# Patient Record
Sex: Female | Born: 1955 | Race: White | Hispanic: No | Smoking: Current every day smoker
Health system: Southern US, Community
[De-identification: ages and names within clinical notes are randomized; demographics above are authoritative.]

## PROBLEM LIST (undated history)

## (undated) DIAGNOSIS — D0510 Intraductal carcinoma in situ of unspecified breast: Secondary | ICD-10-CM

## (undated) DIAGNOSIS — I2510 Atherosclerotic heart disease of native coronary artery without angina pectoris: Secondary | ICD-10-CM

## (undated) DIAGNOSIS — K648 Other hemorrhoids: Secondary | ICD-10-CM

## (undated) DIAGNOSIS — F909 Attention-deficit hyperactivity disorder, unspecified type: Secondary | ICD-10-CM

## (undated) DIAGNOSIS — J189 Pneumonia, unspecified organism: Secondary | ICD-10-CM

## (undated) DIAGNOSIS — I6529 Occlusion and stenosis of unspecified carotid artery: Secondary | ICD-10-CM

## (undated) DIAGNOSIS — F17200 Nicotine dependence, unspecified, uncomplicated: Principal | ICD-10-CM

## (undated) DIAGNOSIS — D126 Benign neoplasm of colon, unspecified: Secondary | ICD-10-CM

## (undated) DIAGNOSIS — I82409 Acute embolism and thrombosis of unspecified deep veins of unspecified lower extremity: Secondary | ICD-10-CM

## (undated) DIAGNOSIS — E039 Hypothyroidism, unspecified: Secondary | ICD-10-CM

## (undated) DIAGNOSIS — K5790 Diverticulosis of intestine, part unspecified, without perforation or abscess without bleeding: Secondary | ICD-10-CM

## (undated) DIAGNOSIS — G5600 Carpal tunnel syndrome, unspecified upper limb: Secondary | ICD-10-CM

## (undated) DIAGNOSIS — I10 Essential (primary) hypertension: Secondary | ICD-10-CM

## (undated) DIAGNOSIS — M1990 Unspecified osteoarthritis, unspecified site: Secondary | ICD-10-CM

## (undated) DIAGNOSIS — D509 Iron deficiency anemia, unspecified: Secondary | ICD-10-CM

## (undated) HISTORY — DX: Hypothyroidism, unspecified: E03.9

## (undated) HISTORY — DX: Attention-deficit hyperactivity disorder, unspecified type: F90.9

## (undated) HISTORY — DX: Nicotine dependence, unspecified, uncomplicated: F17.200

## (undated) HISTORY — PX: ABDOMINAL HYSTERECTOMY: SHX81

## (undated) HISTORY — DX: Diverticulosis of intestine, part unspecified, without perforation or abscess without bleeding: K57.90

## (undated) HISTORY — DX: Iron deficiency anemia, unspecified: D50.9

## (undated) HISTORY — PX: UPPER GASTROINTESTINAL ENDOSCOPY: SHX188

## (undated) HISTORY — DX: Atherosclerotic heart disease of native coronary artery without angina pectoris: I25.10

## (undated) HISTORY — DX: Other hemorrhoids: K64.8

## (undated) HISTORY — PX: COLONOSCOPY: SHX174

## (undated) HISTORY — DX: Acute embolism and thrombosis of unspecified deep veins of unspecified lower extremity: I82.409

## (undated) HISTORY — DX: Carpal tunnel syndrome, unspecified upper limb: G56.00

## (undated) HISTORY — DX: Benign neoplasm of colon, unspecified: D12.6

## (undated) HISTORY — DX: Intraductal carcinoma in situ of unspecified breast: D05.10

## (undated) HISTORY — DX: Occlusion and stenosis of unspecified carotid artery: I65.29

---

## 2003-01-05 ENCOUNTER — Encounter: Admission: RE | Admit: 2003-01-05 | Discharge: 2003-01-05 | Payer: Self-pay | Admitting: Family Medicine

## 2003-01-05 ENCOUNTER — Encounter: Payer: Self-pay | Admitting: Family Medicine

## 2003-10-01 ENCOUNTER — Encounter: Admission: RE | Admit: 2003-10-01 | Discharge: 2003-10-01 | Payer: Self-pay | Admitting: Family Medicine

## 2004-11-10 ENCOUNTER — Ambulatory Visit: Payer: Self-pay | Admitting: Family Medicine

## 2006-09-18 ENCOUNTER — Emergency Department (HOSPITAL_COMMUNITY): Admission: EM | Admit: 2006-09-18 | Discharge: 2006-09-18 | Payer: Self-pay | Admitting: Family Medicine

## 2006-10-15 ENCOUNTER — Emergency Department (HOSPITAL_COMMUNITY): Admission: EM | Admit: 2006-10-15 | Discharge: 2006-10-15 | Payer: Self-pay | Admitting: Family Medicine

## 2007-01-03 ENCOUNTER — Encounter: Admission: RE | Admit: 2007-01-03 | Discharge: 2007-01-03 | Payer: Self-pay | Admitting: Family Medicine

## 2007-01-08 ENCOUNTER — Encounter: Admission: RE | Admit: 2007-01-08 | Discharge: 2007-01-08 | Payer: Self-pay | Admitting: Family Medicine

## 2007-01-08 ENCOUNTER — Encounter (INDEPENDENT_AMBULATORY_CARE_PROVIDER_SITE_OTHER): Payer: Self-pay | Admitting: Specialist

## 2007-01-30 ENCOUNTER — Encounter: Admission: RE | Admit: 2007-01-30 | Discharge: 2007-01-30 | Payer: Self-pay | Admitting: Surgery

## 2007-01-30 ENCOUNTER — Encounter (INDEPENDENT_AMBULATORY_CARE_PROVIDER_SITE_OTHER): Payer: Self-pay | Admitting: Specialist

## 2007-01-30 ENCOUNTER — Ambulatory Visit (HOSPITAL_BASED_OUTPATIENT_CLINIC_OR_DEPARTMENT_OTHER): Admission: RE | Admit: 2007-01-30 | Discharge: 2007-01-30 | Payer: Self-pay | Admitting: Surgery

## 2007-02-04 ENCOUNTER — Ambulatory Visit: Payer: Self-pay | Admitting: Oncology

## 2007-02-07 ENCOUNTER — Ambulatory Visit: Admission: RE | Admit: 2007-02-07 | Discharge: 2007-04-19 | Payer: Self-pay | Admitting: *Deleted

## 2007-02-13 ENCOUNTER — Encounter: Admission: RE | Admit: 2007-02-13 | Discharge: 2007-02-13 | Payer: Self-pay | Admitting: Surgery

## 2007-02-21 ENCOUNTER — Encounter (INDEPENDENT_AMBULATORY_CARE_PROVIDER_SITE_OTHER): Payer: Self-pay | Admitting: Surgery

## 2007-02-21 ENCOUNTER — Ambulatory Visit (HOSPITAL_BASED_OUTPATIENT_CLINIC_OR_DEPARTMENT_OTHER): Admission: RE | Admit: 2007-02-21 | Discharge: 2007-02-21 | Payer: Self-pay | Admitting: Surgery

## 2007-03-27 ENCOUNTER — Ambulatory Visit: Payer: Self-pay | Admitting: Oncology

## 2007-03-29 LAB — COMPREHENSIVE METABOLIC PANEL
ALT: 16 U/L (ref 0–35)
AST: 19 U/L (ref 0–37)
Albumin: 4.1 g/dL (ref 3.5–5.2)
Alkaline Phosphatase: 92 U/L (ref 39–117)
BUN: 11 mg/dL (ref 6–23)
CO2: 25 mEq/L (ref 19–32)
Calcium: 9.6 mg/dL (ref 8.4–10.5)
Chloride: 105 mEq/L (ref 96–112)
Creatinine, Ser: 0.84 mg/dL (ref 0.40–1.20)
Glucose, Bld: 98 mg/dL (ref 70–99)
Potassium: 4 mEq/L (ref 3.5–5.3)
Sodium: 140 mEq/L (ref 135–145)
Total Bilirubin: 0.5 mg/dL (ref 0.3–1.2)
Total Protein: 6.4 g/dL (ref 6.0–8.3)

## 2007-03-29 LAB — CBC WITH DIFFERENTIAL/PLATELET
BASO%: 0.5 % (ref 0.0–2.0)
Basophils Absolute: 0 10*3/uL (ref 0.0–0.1)
EOS%: 2.4 % (ref 0.0–7.0)
Eosinophils Absolute: 0.2 10*3/uL (ref 0.0–0.5)
HCT: 39.2 % (ref 34.8–46.6)
HGB: 13.9 g/dL (ref 11.6–15.9)
LYMPH%: 35.5 % (ref 14.0–48.0)
MCH: 34.3 pg — ABNORMAL HIGH (ref 26.0–34.0)
MCHC: 35.4 g/dL (ref 32.0–36.0)
MCV: 96.9 fL (ref 81.0–101.0)
MONO#: 0.5 10*3/uL (ref 0.1–0.9)
MONO%: 5.3 % (ref 0.0–13.0)
NEUT#: 5.7 10*3/uL (ref 1.5–6.5)
NEUT%: 56.3 % (ref 39.6–76.8)
Platelets: 423 10*3/uL — ABNORMAL HIGH (ref 145–400)
RBC: 4.05 10*6/uL (ref 3.70–5.32)
RDW: 13.6 % (ref 11.3–14.5)
WBC: 10.1 10*3/uL — ABNORMAL HIGH (ref 3.9–10.0)
lymph#: 3.6 10*3/uL — ABNORMAL HIGH (ref 0.9–3.3)

## 2007-04-19 ENCOUNTER — Ambulatory Visit: Admission: RE | Admit: 2007-04-19 | Discharge: 2007-05-23 | Payer: Self-pay | Admitting: *Deleted

## 2007-06-13 ENCOUNTER — Ambulatory Visit: Payer: Self-pay | Admitting: Oncology

## 2007-06-13 LAB — FOLLICLE STIMULATING HORMONE: FSH: 79.9 m[IU]/mL

## 2007-06-13 LAB — LUTEINIZING HORMONE: LH: 47.8 m[IU]/mL

## 2007-06-20 LAB — ESTRADIOL, ULTRA SENS: Estradiol, Ultra Sensitive: <2 pg/mL

## 2007-06-28 ENCOUNTER — Encounter: Admission: RE | Admit: 2007-06-28 | Discharge: 2007-06-28 | Payer: Self-pay | Admitting: Oncology

## 2007-09-05 ENCOUNTER — Ambulatory Visit: Payer: Self-pay | Admitting: Oncology

## 2007-10-14 ENCOUNTER — Inpatient Hospital Stay (HOSPITAL_COMMUNITY): Admission: EM | Admit: 2007-10-14 | Discharge: 2007-10-16 | Payer: Self-pay | Admitting: Emergency Medicine

## 2007-10-14 ENCOUNTER — Encounter (INDEPENDENT_AMBULATORY_CARE_PROVIDER_SITE_OTHER): Payer: Self-pay | Admitting: Internal Medicine

## 2007-10-16 ENCOUNTER — Encounter (INDEPENDENT_AMBULATORY_CARE_PROVIDER_SITE_OTHER): Payer: Self-pay | Admitting: Internal Medicine

## 2007-10-18 ENCOUNTER — Ambulatory Visit: Payer: Self-pay | Admitting: Surgery

## 2008-01-06 ENCOUNTER — Encounter: Admission: RE | Admit: 2008-01-06 | Discharge: 2008-01-06 | Payer: Self-pay | Admitting: Oncology

## 2008-02-27 ENCOUNTER — Ambulatory Visit: Payer: Self-pay | Admitting: Oncology

## 2008-03-02 LAB — CBC WITH DIFFERENTIAL/PLATELET
BASO%: 0.5 % (ref 0.0–2.0)
Basophils Absolute: 0 10*3/uL (ref 0.0–0.1)
EOS%: 1.2 % (ref 0.0–7.0)
Eosinophils Absolute: 0.1 10*3/uL (ref 0.0–0.5)
HCT: 40.2 % (ref 34.8–46.6)
HGB: 14 g/dL (ref 11.6–15.9)
LYMPH%: 23.5 % (ref 14.0–48.0)
MCH: 33.2 pg (ref 26.0–34.0)
MCHC: 34.8 g/dL (ref 32.0–36.0)
MCV: 95.5 fL (ref 81.0–101.0)
MONO#: 0.5 10*3/uL (ref 0.1–0.9)
MONO%: 4.9 % (ref 0.0–13.0)
NEUT#: 6.4 10*3/uL (ref 1.5–6.5)
NEUT%: 69.9 % (ref 39.6–76.8)
Platelets: 417 10*3/uL — ABNORMAL HIGH (ref 145–400)
RBC: 4.21 10*6/uL (ref 3.70–5.32)
RDW: 13.3 % (ref 11.3–14.5)
WBC: 9.2 10*3/uL (ref 3.9–10.0)
lymph#: 2.2 10*3/uL (ref 0.9–3.3)

## 2008-03-03 LAB — COMPREHENSIVE METABOLIC PANEL
ALT: 22 U/L (ref 0–35)
AST: 21 U/L (ref 0–37)
Albumin: 4.3 g/dL (ref 3.5–5.2)
Alkaline Phosphatase: 78 U/L (ref 39–117)
BUN: 13 mg/dL (ref 6–23)
CO2: 23 mEq/L (ref 19–32)
Calcium: 9.6 mg/dL (ref 8.4–10.5)
Chloride: 107 mEq/L (ref 96–112)
Creatinine, Ser: 0.66 mg/dL (ref 0.40–1.20)
Glucose, Bld: 107 mg/dL — ABNORMAL HIGH (ref 70–99)
Potassium: 4.8 mEq/L (ref 3.5–5.3)
Sodium: 139 mEq/L (ref 135–145)
Total Bilirubin: 0.5 mg/dL (ref 0.3–1.2)
Total Protein: 6.9 g/dL (ref 6.0–8.3)

## 2008-03-03 LAB — LACTATE DEHYDROGENASE: LDH: 153 U/L (ref 94–250)

## 2008-03-03 LAB — VITAMIN D 25 HYDROXY (VIT D DEFICIENCY, FRACTURES): Vit D, 25-Hydroxy: 55 ng/mL (ref 30–89)

## 2008-04-20 ENCOUNTER — Ambulatory Visit: Payer: Self-pay | Admitting: Surgery

## 2008-09-07 ENCOUNTER — Ambulatory Visit: Payer: Self-pay | Admitting: Oncology

## 2008-09-09 LAB — CBC WITH DIFFERENTIAL/PLATELET
BASO%: 0.4 % (ref 0.0–2.0)
Basophils Absolute: 0 10*3/uL (ref 0.0–0.1)
EOS%: 2.4 % (ref 0.0–7.0)
Eosinophils Absolute: 0.2 10*3/uL (ref 0.0–0.5)
HCT: 43.3 % (ref 34.8–46.6)
HGB: 14.9 g/dL (ref 11.6–15.9)
LYMPH%: 18.8 % (ref 14.0–48.0)
MCH: 33.7 pg (ref 26.0–34.0)
MCHC: 34.5 g/dL (ref 32.0–36.0)
MCV: 97.7 fL (ref 81.0–101.0)
MONO#: 0.5 10*3/uL (ref 0.1–0.9)
MONO%: 4.9 % (ref 0.0–13.0)
NEUT#: 7.3 10*3/uL — ABNORMAL HIGH (ref 1.5–6.5)
NEUT%: 73.5 % (ref 39.6–76.8)
Platelets: 408 10*3/uL — ABNORMAL HIGH (ref 145–400)
RBC: 4.43 10*6/uL (ref 3.70–5.32)
RDW: 13.4 % (ref 11.3–14.5)
WBC: 9.9 10*3/uL (ref 3.9–10.0)
lymph#: 1.9 10*3/uL (ref 0.9–3.3)

## 2008-09-10 LAB — COMPREHENSIVE METABOLIC PANEL
ALT: 24 U/L (ref 0–35)
AST: 24 U/L (ref 0–37)
Albumin: 4.8 g/dL (ref 3.5–5.2)
Alkaline Phosphatase: 90 U/L (ref 39–117)
BUN: 12 mg/dL (ref 6–23)
CO2: 23 mEq/L (ref 19–32)
Calcium: 9.8 mg/dL (ref 8.4–10.5)
Chloride: 103 mEq/L (ref 96–112)
Creatinine, Ser: 0.68 mg/dL (ref 0.40–1.20)
Glucose, Bld: 105 mg/dL — ABNORMAL HIGH (ref 70–99)
Potassium: 4.3 mEq/L (ref 3.5–5.3)
Sodium: 138 mEq/L (ref 135–145)
Total Bilirubin: 0.5 mg/dL (ref 0.3–1.2)
Total Protein: 7.2 g/dL (ref 6.0–8.3)

## 2008-09-10 LAB — LACTATE DEHYDROGENASE: LDH: 182 U/L (ref 94–250)

## 2008-09-10 LAB — VITAMIN D 25 HYDROXY (VIT D DEFICIENCY, FRACTURES): Vit D, 25-Hydroxy: 54 ng/mL (ref 30–89)

## 2008-09-10 LAB — CANCER ANTIGEN 27.29: CA 27.29: 19 U/mL (ref 0–39)

## 2009-01-06 ENCOUNTER — Encounter: Admission: RE | Admit: 2009-01-06 | Discharge: 2009-01-06 | Payer: Self-pay | Admitting: Oncology

## 2009-03-12 ENCOUNTER — Ambulatory Visit: Payer: Self-pay | Admitting: Oncology

## 2009-03-16 LAB — CBC WITH DIFFERENTIAL/PLATELET
BASO%: 0.3 % (ref 0.0–2.0)
Basophils Absolute: 0 10*3/uL (ref 0.0–0.1)
EOS%: 3.3 % (ref 0.0–7.0)
Eosinophils Absolute: 0.3 10*3/uL (ref 0.0–0.5)
HCT: 40 % (ref 34.8–46.6)
HGB: 14.2 g/dL (ref 11.6–15.9)
LYMPH%: 28.6 % (ref 14.0–49.7)
MCH: 34.5 pg — ABNORMAL HIGH (ref 25.1–34.0)
MCHC: 35.4 g/dL (ref 31.5–36.0)
MCV: 97.3 fL (ref 79.5–101.0)
MONO#: 0.5 10*3/uL (ref 0.1–0.9)
MONO%: 5.7 % (ref 0.0–14.0)
NEUT#: 5.8 10*3/uL (ref 1.5–6.5)
NEUT%: 62.1 % (ref 38.4–76.8)
Platelets: 341 10*3/uL (ref 145–400)
RBC: 4.11 10*6/uL (ref 3.70–5.45)
RDW: 13.8 % (ref 11.2–14.5)
WBC: 9.4 10*3/uL (ref 3.9–10.3)
lymph#: 2.7 10*3/uL (ref 0.9–3.3)

## 2009-03-17 LAB — COMPREHENSIVE METABOLIC PANEL
ALT: 18 U/L (ref 0–35)
AST: 20 U/L (ref 0–37)
Albumin: 3.9 g/dL (ref 3.5–5.2)
Alkaline Phosphatase: 76 U/L (ref 39–117)
BUN: 12 mg/dL (ref 6–23)
CO2: 24 mEq/L (ref 19–32)
Calcium: 9.7 mg/dL (ref 8.4–10.5)
Chloride: 103 mEq/L (ref 96–112)
Creatinine, Ser: 0.75 mg/dL (ref 0.40–1.20)
Glucose, Bld: 111 mg/dL — ABNORMAL HIGH (ref 70–99)
Potassium: 4.2 mEq/L (ref 3.5–5.3)
Sodium: 138 mEq/L (ref 135–145)
Total Bilirubin: 0.3 mg/dL (ref 0.3–1.2)
Total Protein: 6.5 g/dL (ref 6.0–8.3)

## 2009-03-17 LAB — VITAMIN D 25 HYDROXY (VIT D DEFICIENCY, FRACTURES): Vit D, 25-Hydroxy: 24 ng/mL — ABNORMAL LOW (ref 30–89)

## 2009-03-17 LAB — LACTATE DEHYDROGENASE: LDH: 136 U/L (ref 94–250)

## 2009-04-19 ENCOUNTER — Ambulatory Visit: Payer: Self-pay | Admitting: Surgery

## 2009-06-28 ENCOUNTER — Encounter: Admission: RE | Admit: 2009-06-28 | Discharge: 2009-06-28 | Payer: Self-pay | Admitting: Oncology

## 2010-01-12 ENCOUNTER — Encounter: Admission: RE | Admit: 2010-01-12 | Discharge: 2010-01-12 | Payer: Self-pay | Admitting: Oncology

## 2010-03-23 ENCOUNTER — Ambulatory Visit: Payer: Self-pay | Admitting: Oncology

## 2010-03-25 LAB — COMPREHENSIVE METABOLIC PANEL
ALT: 22 U/L (ref 0–35)
AST: 23 U/L (ref 0–37)
Albumin: 3.8 g/dL (ref 3.5–5.2)
Alkaline Phosphatase: 78 U/L (ref 39–117)
BUN: 14 mg/dL (ref 6–23)
CO2: 26 mEq/L (ref 19–32)
Calcium: 8.8 mg/dL (ref 8.4–10.5)
Chloride: 107 mEq/L (ref 96–112)
Creatinine, Ser: 0.72 mg/dL (ref 0.40–1.20)
Glucose, Bld: 110 mg/dL — ABNORMAL HIGH (ref 70–99)
Potassium: 4 mEq/L (ref 3.5–5.3)
Sodium: 137 mEq/L (ref 135–145)
Total Bilirubin: 0.3 mg/dL (ref 0.3–1.2)
Total Protein: 6.4 g/dL (ref 6.0–8.3)

## 2010-03-25 LAB — CBC WITH DIFFERENTIAL/PLATELET
BASO%: 1.8 % (ref 0.0–2.0)
Basophils Absolute: 0.2 10*3/uL — ABNORMAL HIGH (ref 0.0–0.1)
EOS%: 3.9 % (ref 0.0–7.0)
Eosinophils Absolute: 0.3 10*3/uL (ref 0.0–0.5)
HCT: 38.4 % (ref 34.8–46.6)
HGB: 13.1 g/dL (ref 11.6–15.9)
LYMPH%: 33.3 % (ref 14.0–49.7)
MCH: 33.5 pg (ref 25.1–34.0)
MCHC: 34 g/dL (ref 31.5–36.0)
MCV: 98.5 fL (ref 79.5–101.0)
MONO#: 0.5 10*3/uL (ref 0.1–0.9)
MONO%: 5.9 % (ref 0.0–14.0)
NEUT#: 4.7 10*3/uL (ref 1.5–6.5)
NEUT%: 55.1 % (ref 38.4–76.8)
Platelets: 361 10*3/uL (ref 145–400)
RBC: 3.9 10*6/uL (ref 3.70–5.45)
RDW: 14 % (ref 11.2–14.5)
WBC: 8.6 10*3/uL (ref 3.9–10.3)
lymph#: 2.9 10*3/uL (ref 0.9–3.3)

## 2010-03-25 LAB — LACTATE DEHYDROGENASE: LDH: 126 U/L (ref 94–250)

## 2010-03-26 LAB — VITAMIN D 25 HYDROXY (VIT D DEFICIENCY, FRACTURES): Vit D, 25-Hydroxy: 78 ng/mL (ref 30–89)

## 2010-05-03 ENCOUNTER — Ambulatory Visit: Payer: Self-pay | Admitting: Surgery

## 2010-10-02 DIAGNOSIS — Z923 Personal history of irradiation: Secondary | ICD-10-CM

## 2010-10-02 HISTORY — DX: Personal history of irradiation: Z92.3

## 2010-10-22 ENCOUNTER — Other Ambulatory Visit: Payer: Self-pay | Admitting: Oncology

## 2010-10-22 DIAGNOSIS — Z9889 Other specified postprocedural states: Secondary | ICD-10-CM

## 2010-10-22 DIAGNOSIS — Z1231 Encounter for screening mammogram for malignant neoplasm of breast: Secondary | ICD-10-CM

## 2011-02-03 ENCOUNTER — Other Ambulatory Visit: Payer: Self-pay | Admitting: Oncology

## 2011-02-03 DIAGNOSIS — Z9889 Other specified postprocedural states: Secondary | ICD-10-CM

## 2011-02-07 ENCOUNTER — Ambulatory Visit
Admission: RE | Admit: 2011-02-07 | Discharge: 2011-02-07 | Disposition: A | Discharge status: Home / Self Care | Source: Ambulatory Visit | Attending: Oncology | Admitting: Oncology

## 2011-02-07 ENCOUNTER — Other Ambulatory Visit: Payer: Self-pay | Admitting: Radiology

## 2011-02-07 ENCOUNTER — Other Ambulatory Visit: Payer: Self-pay | Admitting: Oncology

## 2011-02-07 DIAGNOSIS — N6320 Unspecified lump in the left breast, unspecified quadrant: Secondary | ICD-10-CM

## 2011-02-07 DIAGNOSIS — Z9889 Other specified postprocedural states: Secondary | ICD-10-CM

## 2011-02-14 NOTE — Consult Note (Signed)
[name], [name]                ACCOUNT NO.:  [id number]   MEDICAL RECORD NO.:  [id number]          PATIENT TYPE:  INP   LOCATION:  2010                         FACILITY:  [facility]   PHYSICIAN:  Juleen China IV, MDDATE OF BIRTH:  03-22-1956   DATE OF CONSULTATION:  DATE OF DISCHARGE:  10/16/2007                                 CONSULTATION   PHYSICIANS:  Della Goo, M.D. and Marcellus Scott, M.D.   REASON FOR CONSULTATION:  Evaluate carotid disease in the setting of  syncope.   HISTORY OF PRESENT ILLNESS:  This is a 55 year old female who was  brought to the emergency department after 2 episodes of passing out.  This began at approximately 12:30 a.m.  She was found by her daughter on  the kitchen floor, daughter tried to help her out.  However, she passed  out again and EMS was then called.  The patient is amnestic to these  events.  She denies having any dizziness or chest pain.  She denies  neurologic symptoms, specifically numbness or weakness in either  extremity.  She has chronic sensation, irregularity in the tips of her  fingers.  This is not acute.  She denies amaurosis fugax.   REVIEW OF SYSTEMS:  GENERAL:  No weight gain or weight loss.  No fevers  or chills. CARDIAC:  Negative for chest pain.  PULMONARY:  Negative for  shortness of breath.  GI:  Negative for nausea, vomiting, or diarrhea.   PAST MEDICAL HISTORY:  1. Significant for breast cancer in 2008, status post lumpectomy x2      and radiation.  2. History of hypothyroidism.   SOCIAL HISTORY:  Positive for tobacco 2 packs a day as well as alcohol.   MEDICATIONS:  Arimidex and levothyroxine.   ALLERGIES:  PENICILLIN.   FAMILY HISTORY:  Noncontributory.   PHYSICAL EXAMINATION:  GENERAL:  She is well developed and in no acute  distress.  VITAL SIGNS:  Temperament is 97.7, blood pressure is 120/73, and heart  rate is 95.  O2 saturations are 96% on room air.  HEENT:  She is normocephalic and  atraumatic.  Pupils are equal.  Sclerae  are anicteric.  NECK:  Supple without masses.  No JVD.  There are no carotid bruits.  CARDIOVASCULAR:  Regular rate and rhythm without murmur, rub, or gallop.  LUNGS:  Clear to auscultation.  ABDOMEN:  Soft and nontender.  No hepatosplenomegaly.  EXTREMITIES:  Warm and well perfused.  PSYCHIATRIC:  She is alert and oriented x3.  NEUROLOGIC:  Cranial nerves 2 through 12 are grossly intact.  There are  no motor or sensory deficits.  SKIN:  Without rash.   DIAGNOSTIC STUDIES:  Carotid duplex was obtained which reveals bilateral  internal carotid artery stenosis in the 60%-80% range.  Bilateral  vertebral artery flow is noted to be antegrade.   ASSESSMENT/PLAN:  This is a 55 year old female admitted for syncope.  I  am asked to evaluate for carotid disease.  In the context of the  patient's episode, this was associated around taking Flexeril as well as  alcohol.  Given her duplex findings, I do not feel that this is related  to her carotid disease.  She denies having any other symptoms consistent  with transient ischemic attack.  For that reason, I would pursue other  etiologies for her syncope.  In any event, she should be maintained on  an aspirin and I will have her follow up with me in 6 months' time with  a repeat duplex.  I have told her that if these symptoms recur that she  needs to go immediately to the emergency department, but again I do not  feel like her syncopal episodes is due to her carotid disease.   Thank you for the consult.           ______________________________  V. Charlena Cross, MD  Electronically Signed     VWB/MEDQ  D:  10/20/2007  T:  10/21/2007  Job:  [id number]

## 2011-02-14 NOTE — Op Note (Signed)
NAMEMAYARA, PAULSON                ACCOUNT NO.:  [id number]   MEDICAL RECORD NO.:  [id number]          PATIENT TYPE:  AMB   LOCATION:  DSC                          FACILITY:  [facility]   PHYSICIAN:  Wilmon Arms. Corliss Skains, M.D. DATE OF BIRTH:  07/10/1956   DATE OF PROCEDURE:  02/21/2007  DATE OF DISCHARGE:                               OPERATIVE REPORT   PREOPERATIVE DIAGNOSIS:  Left breast ductal carcinoma in situ.   POSTOPERATIVE DIAGNOSIS:  Left breast ductal carcinoma in situ.   PROCEDURE PERFORMED:  Re-excision of medial margin, left breast.   SURGEON:  Wilmon Arms. Tsuei, M.D.   ANESTHESIA:  General via LMA.   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  She underwent a needle-localized lumpectomy.  The biopsy showed DCIS  measuring 1.7 cm.  The margins were clear but the medial margin was only  1 mm.  She has been evaluated by Dr. Dorna Bloom in radiation oncology.  An MRI  showed no other significant findings.  Dr. Dorna Bloom has asked for a re-  excision of the medial margin prior to treatment.   DESCRIPTION OF PROCEDURE:  The patient was brought to the operating room  and placed in a supine position on the operating room table.  After an  adequate level of general anesthesia was obtained, the patient's left  breast was prepped with Betadine and draped in a sterile fashion.  A  time-out was taken to assure the proper patient and proper procedure.  Her previous incision was infiltrated with 0.25% Marcaine with  epinephrine.  The incision was opened with a scalpel.  Dissection was  carried down into the seroma cavity.  No purulence was noted.  The  medial margin was grasped with an Allis clamp.  I took an additional 1.5  cm of tissue from the medial margin with cautery.  We took this from  just deep to the skin all the way down to the base of the seroma cavity,  which was at the pectoralis muscle.  The  specimen was oriented with a  suture on the old medial margin.  This was sent for pathologic  examination.  The seroma cavity was then thoroughly irrigated.  Hemostasis was obtained with cautery.  The wound was closed with a deep  layer of 3-0 Vicryl and a subcuticular layer of 4-0 Monocryl.  Steri-  Strips and clean dressings were applied.  The patient was extubated and  brought to recovery in stable condition.  All sponge, instrument, and  needle counts were correct.      Wilmon Arms. Tsuei, M.D.  Electronically Signed     MKT/MEDQ  D:  02/21/2007  T:  02/21/2007  Job:  [id number]   cc:   Elmer Sow. Dorna Bloom, M.D.

## 2011-02-14 NOTE — Procedures (Signed)
CAROTID DUPLEX EXAM   INDICATION:  Follow up of known carotid artery disease.   HISTORY:  Diabetes:  No.  Cardiac:  No.  Hypertension:  No.  Smoking:  Yes.  Previous Surgery:  No.  CV History:  No.  Amaurosis Fugax No, Paresthesias No, Hemiparesis No.                                       RIGHT             LEFT  Brachial systolic pressure:         124               120  Brachial Doppler waveforms:         WNL               WNL  Vertebral direction of flow:        Antegrade         Antegrade  DUPLEX VELOCITIES (cm/sec)  CCA peak systolic                   113               151  ECA peak systolic                   128               115  ICA peak systolic                   120               130  ICA end diastolic                   56                61  PLAQUE MORPHOLOGY:                  Heterogenous      Heterogenous  PLAQUE AMOUNT:                      Moderate          Moderate  PLAQUE LOCATION:                    BIF, ICA          BIF, ICA   IMPRESSION:  Bilateral high-end 40-59% internal carotid artery stenosis.   ___________________________________________  V. Charlena Cross, MD   AC/MEDQ  D:  04/19/2009  T:  04/20/2009  Job:  [id number]

## 2011-02-14 NOTE — H&P (Signed)
[name], [name]                ACCOUNT NO.:  [id number]   MEDICAL RECORD NO.:  [id number]          PATIENT TYPE:  INP   LOCATION:  2010                         FACILITY:  [facility]   PHYSICIAN:  Della Goo, M.D. DATE OF BIRTH:  10-05-1955   DATE OF ADMISSION:  10/14/2007  DATE OF DISCHARGE:                              HISTORY & PHYSICAL   PRIMARY CARE PHYSICIAN:  Unassigned.   CHIEF COMPLAINT:  Passed out.   HISTORY OF PRESENT ILLNESS:  This is a 55 year old female who was  brought to the emergency room after passing out twice, this occurred  starting at 12:30 a.m.  She was found by her daughter on the kitchen  floor.  She said that she had awakened and gone to the kitchen and her  daughter found her on the kitchen floor.  Her daughter tried to help her  help her back to her bedroom and she passed out again.  Emergency  medical services were called.  The patient does not remember after  passing out.  Denies having any dizziness, chest pain, shortness of  breath, fevers, chills, nausea, vomiting or diarrhea associated prior to  the event.   PAST MEDICAL HISTORY:  Significant for breast cancer April 2008, status  post lumpectomy times 2 and radiation therapy times 7 weeks.  History of  hypothyroidism.   MEDICATIONS:  Her medications include Arimidex 1 p.o. daily,  levothyroxine 75 mcg 1 p.o. daily.   ALLERGIES:  PENICILLIN.   SOCIAL HISTORY:  Positive smoker 2 packs per day.  Positive alcohol 1 to  2 beers daily.   FAMILY HISTORY:  Noncontributory.   PHYSICAL EXAMINATION:  GENERAL:  A 55 year old well-nourished, well-  developed female in no acute distress.  VITAL SIGNS:  Her vital signs are temperature 97.7, blood pressure  120/73, heart rate 95, respirations 18, O2 saturations 96%.  HEENT: Examination normocephalic, atraumatic.  Pupils equally round,  reactive to light.  Extraocular muscles are intact.  Funduscopic benign.  Oropharynx is clear.  NECK:  Neck is  supple with full range of motion.  No thyromegaly,  adenopathy, jugulovenous distention.  CARDIOVASCULAR:  Regular rate and rhythm.  No murmurs, gallops or rubs.  LUNGS:  Clear to auscultation bilaterally.  ABDOMEN:  Positive bowel sounds, soft, nontender, nondistended.  EXTREMITIES:  Without cyanosis, clubbing or edema.  NEUROLOGIC:  Examination alert and oriented times 3.  Cranial nerves are  intact.  Motor and sensory function are intact.  There are no focal  findings.   LABORATORY STUDIES:  White blood cell count 17.4, hemoglobin 13.7,  hematocrit 39.7, platelets 415, neutrophils 85%, lymphocytes 11%, D-  dimer less than 0.22.  Sodium 139, potassium 3.5, chloride 106, carbon  dioxide 24, BUN 8, creatinine 0.7, glucose 114, albumin 3.6, AST 25, ALT  23.  Cardiac enzymes with a myoglobin of 61.8, CK-MB 2 and troponin less  than 0.05.   ASSESSMENT:  65. A 55 year old female being admitted with syncope versus seizure.  2. Hypothyroidism.  3. Breast cancer history.  4. Leukocytosis.   PLAN:  The patient will be admitted to telemetry area  for cardiac  monitoring and cardiac enzymes will be performed.  The patient will be  placed on bedrest, fall precautions.  A syncope and seizure workup will  be started.  An magnetic resonance imaging of the brain will be ordered.  Carotid ultrasound and electroencephalogram study.  She will continue on  her regular medications.  Orthostatic vital signs will also be ordered  and deep venous thrombosis and gastrointestinal prophylaxis will be  ordered as well as a nicotine patch.      Della Goo, M.D.  Electronically Signed     HJ/MEDQ  D:  10/15/2007  T:  10/15/2007  Job:  [id number]

## 2011-02-14 NOTE — Discharge Summary (Signed)
NAMEJASMYNE, [name] NO.:  [id number]   MEDICAL RECORD NO.:  [id number]          PATIENT TYPE:  INP   LOCATION:  2010                         FACILITY:  [facility]   PHYSICIAN:  Jean Scott, MD     DATE OF BIRTH:  [birth year]   DATE OF ADMISSION:  10/14/2007  DATE OF DISCHARGE:  10/16/2007                               DISCHARGE SUMMARY   PRIMARY MEDICAL DOCTOR:  [name], M.D. of [facility].   DISCHARGE DIAGNOSES:  1. Syncope - unclear etiology.  2. Sixty to eighty percent bilateral internal carotid artery stenosis.  3. Tobacco abuse.  4. Hypothyroidism.  5. Status post left breast carcinoma.  6. Resolved leukocytosis.  7. Left thigh strain.   DISCHARGE MEDICATIONS:  1. Arimidex 1 mg p.o. daily.  2. Levothyroxine 75 mcg p.o. daily.  3. Adderall 10 mg p.o. daily.  4. Ibuprofen 400 mg p.o. t.i.d. p.r.n.  5. Nicotine patch 21 mg per 24 hours, one daily for 5 weeks then      taper.  6. Enteric-coated aspirin 325 p.o. daily.   PROCEDURES:  1. Chest x-ray on October 14, 2007.  Impression - no evidence of acute      cardiopulmonary disease.  2. CT of the head without contrast.  Impression - no evidence of acute      intracranial abnormality.  3. MRI of the brain with and without contrast.  Impression - (a) no      acute infarction or abnormal intracranial enhancing lesion, (b)      minimal paranasal sinus mucosal thickening.  4. EEG.  Impression - normal record with the patient awake and drowsy.  5. Carotid Dopplers.  Summary - mild soft plaque throughout      bilaterally.  Vertebral artery flow antegrade bilaterally.  There      was 60-80% ICA stenosis noted bilaterally.  6. Echocardiogram - pending.   PERTINENT LABORATORY RESULTS:  Lipid profile - cholesterol 175,  triglycerides 93, HDL 53, LDL 103, VLDL 19.  TSH is 2.421.  Blood  cultures were no growth to date.  Basic metabolic panel unremarkable  with BUN of 5, creatinine of  0.73.  CBC is unremarkable with a  hemoglobin 13.5, hematocrit 39.3.  Urinalysis not suggestive of a  urinary tract infection.  Cardiac panel cycled and negative.   CONSULTATIONS:  Vascular surgery - Dr. Myra Ryan.   HOSPITAL COURSE AND PATIENT DISPOSITION:  For the details of initial  admission, please refer to the history and physical note.  In summary,  Ms. [name] is a pleasant 55 year old female patient with a history of  breast cancer, hypothyroidism, ADHD, who was brought to the emergency  room after having passed out twice in the early hours of the morning.  The patient indicates that she had had a mixed drink and a beer hours  before the event.  She also had taken some Flexeril for pain in her left  posterior thigh.  She indicates that she had earlier slipped and almost  did a splitbut did not hit herself anywhere.  However, she  started  having pain in the back of her left thigh, for which she took Flexeril.  In any event, she had 2 episodes of passing out, following which she was  brought to the emergency room for further evaluation and management.  She was admitted to a telemetry bed with a diagnosis of syncope versus  seizure disorder.  Extensive work-up has since been done as indicated  above.  Vascular surgery was consulted, who have indicated that the  patient's symptoms are not classical for TIA, so doubt carotid stenosis  as the etiology of her syncopal event.  They have indicated tobacco  cessation, aspirin and follow up ultrasound in 6 months time.  Will have  her echo done this morning and red by cardiology before discharge.  Also, the patient has been counseled regarding tobacco cessation and is  being provided a nicotine patch.  The patient has mild tenderness in the  left posterior thigh, which is probably a muscle strain.  The patient  has been advised to take NSAIDs with meals and follow up with her  primary medical doctor.  The patient has been instructed to seek   immediate medical attention if there is any further deterioration of her  condition.      Jean Scott, MD  Electronically Signed     AH/MEDQ  D:  10/16/2007  T:  10/16/2007  Job:  [id number]   cc:   Jean John T. Pamalee Leyden, MD  V. Charlena Cross, MD

## 2011-02-14 NOTE — Procedures (Signed)
CAROTID DUPLEX EXAM   INDICATION:  Follow up known carotid disease.   HISTORY:  Diabetes:  No.  Cardiac:  No.  Hypertension:  No.  Smoking:  Yes.  Previous Surgery:  No.  CV History:  Asymptomatic.  Amaurosis Fugax No, Paresthesias No, Hemiparesis No.                                       RIGHT             LEFT  Brachial systolic pressure:         110               108  Brachial Doppler waveforms:         Normal            Normal  Vertebral direction of flow:        Antegrade         Antegrade  DUPLEX VELOCITIES (cm/sec)  CCA peak systolic                   126               153  ECA peak systolic                   145               128  ICA peak systolic                   113               111  ICA end diastolic                   47                50  PLAQUE MORPHOLOGY:                  Heterogenous      Heterogenous  PLAQUE AMOUNT:                      Mild-to-moderate  Mild-to-moderate  PLAQUE LOCATION:                    ICA               ICA   IMPRESSION:  1. Bilateral Doppler velocities suggest low-end 40% to 59% stenosis in      the internal carotid arteries.  2. Antegrade flow noted in the bilateral vertebral arteries.  3. No significant change from previous examination.   ___________________________________________  V. Charlena Cross, MD   NT/MEDQ  D:  05/03/2010  T:  05/03/2010  Job:  [id number]

## 2011-02-14 NOTE — Procedures (Signed)
CAROTID DUPLEX EXAM   INDICATION:  Followup of known carotid artery disease.  Patient had two  syncopal events in January, 2009 where she went to [facility].  Duplex done there revealed a bilateral 60-80% internal carotid artery  stenosis.   HISTORY:  Diabetes:  No.  Cardiac:  No.  Hypertension:  No.  Smoking:  Yes, <1 pack per day.  Previous Surgery:  Patient has history of glandular cancer.  CV History:  Amaurosis Fugax No, Paresthesias No, Hemiparesis No.                                       RIGHT             LEFT  Brachial systolic pressure:         128               132  Brachial Doppler waveforms:         Triphasic         Triphasic  Vertebral direction of flow:        Antegrade         Antegrade  DUPLEX VELOCITIES (cm/sec)  CCA peak systolic                   70                76  ECA peak systolic                   72                74  ICA peak systolic                   (P) 87, (M) 132   (P) 72, (M) 117  ICA end diastolic                   35, 60            34, 52  PLAQUE MORPHOLOGY:                  Heterogenous      Heterogenous  PLAQUE AMOUNT:                      Mild              Mild  PLAQUE LOCATION:                    ICA               ICA   IMPRESSION:  1. Bilateral 40-59% internal carotid artery stenoses with highest      velocities noted at the mid internal carotid arteries bilaterally.  2. Patent external carotid arteries.  3. Bilateral antegrade flow in vertebral arteries.   ___________________________________________  V. Charlena Cross, MD   PB/MEDQ  D:  04/20/2008  T:  04/20/2008  Job:  [id number]

## 2011-02-14 NOTE — Assessment & Plan Note (Signed)
OFFICE VISIT   GIGI, ONSTAD L  DOB:  1956/02/29                                       04/20/2008  AOZHY#:[id number]   REASON FOR VISIT:  Follow up carotid disease.   HISTORY:  This is a 55 year old female that I initially saw as a consult  in the hospital.  She suffered a syncopal episode and during her workup  was found to have bilateral carotid disease.  At the time, I felt that  her syncopal episodes were not related to her carotid disease, but were  most likely associated with Flexeril as well as alcohol that were taken  around the time of her episodes.  She comes back in today for a 62-month  follow-up.  She has not had any episodes in the interim.  She denies  having any amaurosis, she denies having any numbness or weakness in  either extremity or slurring of her speech.   PHYSICAL EXAMINATION:  Her blood pressure is 122/80 the left arm, 132/82  in the right arm, pulse is 74.  General:  She is well-appearing, in no  acute distress.  Neuro:  She has no focal deficits.  Neck:  Without  carotid bruits.  Cardiovascular:  Regular rate and rhythm.  Skin:  Without rash.  Respirations:  Nonlabored.   DIAGNOSTIC STUDIES:  The patient had a duplex ultrasound today which  reveals 40-59% stenosis in bilateral carotid arteries.  She has  bilateral vertebral flow.   ASSESSMENT/PLAN:  Bilateral carotid disease.   PLAN:  I have discussed the signs and symptoms of TIA/stroke and I told  the patient that if she experiences these she should get in touch with  the emergency department.  She has 40-59% stenosis within bilateral  carotid arteries, I do not feel any intervention is warranted at this  time.  I would continue her current medications which include a  cholesterol lowering agent as well as an aspirin.  I am going to have  her see me back in 1 year, she will have a repeat carotid ultrasound  that time.   Jorge Ny, MD  Electronically Signed   VWB/MEDQ  D:  04/20/2008  T:  04/21/2008  Job:  [id number]   cc:   Charolotte Eke

## 2011-02-14 NOTE — Op Note (Signed)
[name], [name]                ACCOUNT NO.:  [id number]   MEDICAL RECORD NO.:  [id number]          PATIENT TYPE:  AMB   LOCATION:  DSC                          FACILITY:  [facility]   PHYSICIAN:  Wilmon Arms. Corliss Skains, M.D. DATE OF BIRTH:  1956-01-04   DATE OF PROCEDURE:  01/30/2007  DATE OF DISCHARGE:                               OPERATIVE REPORT   PREOPERATIVE DIAGNOSIS:  Left breast atypical ductal hyperplasia.   POSTOPERATIVE DIAGNOSIS:  Left breast atypical ductal hyperplasia.   PROCEDURE PERFORMED:  Left breast needle-localized lumpectomy.   SURGEON:  Wilmon Arms. Tsuei, M.D.   ANESTHESIA:  General.   INDICATIONS:  The patient is a 55 year old female who underwent a  screening mammogram showing a mass in the left breast.  This was  biopsied and showed cytology consistent with atypical ductal  hyperplasia.  She is now sent for complete excision.  She had a wire  placed preoperatively.   DESCRIPTION OF PROCEDURE:  The patient is brought to the operating room  and placed in the supine position on the operating room table.  After an  adequate level of general anesthesia was obtained, the patient's left  breast was prepped with Betadine and draped in sterile fashion.  An  elliptical incision was made around the wire after infiltrating with  0.25% Marcaine.  Skin flaps were raised in all directions.  Cautery was  used to remove a cylinder of breast tissue around the wire.  We carried  this down just to the surface of the pectoralis fascia.  The specimen  was removed and sent for specimen mammogram.  It was oriented with a  long suture lateral and a short suture superior.  The wound was then  inspect for hemostasis.  The wound was closed with 3-0 Vicryl and a  subcuticular layer of 4-0 Monocryl.  Steri-Strips and clean dressings  applied.  Telephone report showed that the specimen mammogram contained  the biopsy clip.  The specimen is not sent for pathologic examination.  The patient  was extubated and brought to the recovery room in stable  condition.  All sponge, instrument, needle counts were correct.      Wilmon Arms. Tsuei, M.D.  Electronically Signed     MKT/MEDQ  D:  01/30/2007  T:  01/30/2007  Job:  [id number]   cc:   Anselmo Pickler, M.D.

## 2011-02-14 NOTE — Procedures (Signed)
EEG NUMBER:  [id number].   CLINICAL HISTORY:  The patient is a 55 year old woman with two episodes  of syncope (780.2).   MEDICATIONS:  Include Lovenox, Protonix, Synthroid, Arimidex, nicotine  patch, Zofran and Tylenol.   PROCEDURE:  The tracing was carried out on a 32-channel digital Cadwell  recorder reformatted into 16-channel montages with one devoted to EKG.  The patient was awake and asleep during the recording.  The  [employer] 10/20 system lead placement used.   DESCRIPTION OF FINDINGS:  Dominant frequency is a 10 Hz, 20-50 microvolt  activity that is well regulated with frontally predominant beta range  activity.  The patient becomes drowsy and drifts between drowsiness and  arousal with rhythmic theta and upper delta range activity of 15-20  microvolts.  The patient does not drift into natural sleep.  There was  no focal slowing.  There was no interictal epileptiform activity in the  form of spikes or sharp waves.  EKG showed a regular sinus rhythm with  ventricular response of 72 beats per minute.   IMPRESSION:  Normal record with the patient awake and drowsy.      Deanna Artis. Sharene Skeans, M.D.  Electronically Signed     ZOX:WRUE  D:  10/15/2007 15:55:49  T:  10/15/2007 22:10:26  Job #:  [id number]   cc:   [facility] [facility]

## 2011-06-08 ENCOUNTER — Other Ambulatory Visit: Payer: Self-pay | Admitting: Oncology

## 2011-06-08 ENCOUNTER — Encounter (HOSPITAL_BASED_OUTPATIENT_CLINIC_OR_DEPARTMENT_OTHER): Admitting: Oncology

## 2011-06-08 DIAGNOSIS — Z7982 Long term (current) use of aspirin: Secondary | ICD-10-CM

## 2011-06-08 DIAGNOSIS — D0590 Unspecified type of carcinoma in situ of unspecified breast: Secondary | ICD-10-CM

## 2011-06-08 DIAGNOSIS — F17200 Nicotine dependence, unspecified, uncomplicated: Secondary | ICD-10-CM

## 2011-06-08 DIAGNOSIS — Z170 Estrogen receptor positive status [ER+]: Secondary | ICD-10-CM

## 2011-06-08 LAB — CBC WITH DIFFERENTIAL/PLATELET
BASO%: 1.2 % (ref 0.0–2.0)
Basophils Absolute: 0.1 10*3/uL (ref 0.0–0.1)
EOS%: 1.9 % (ref 0.0–7.0)
Eosinophils Absolute: 0.2 10*3/uL (ref 0.0–0.5)
HCT: 39.5 % (ref 34.8–46.6)
HGB: 13.7 g/dL (ref 11.6–15.9)
LYMPH%: 28.4 % (ref 14.0–49.7)
MCH: 33.5 pg (ref 25.1–34.0)
MCHC: 34.8 g/dL (ref 31.5–36.0)
MCV: 96.3 fL (ref 79.5–101.0)
MONO#: 0.4 10*3/uL (ref 0.1–0.9)
MONO%: 4 % (ref 0.0–14.0)
NEUT#: 6 10*3/uL (ref 1.5–6.5)
NEUT%: 64.5 % (ref 38.4–76.8)
Platelets: 383 10*3/uL (ref 145–400)
RBC: 4.1 10*6/uL (ref 3.70–5.45)
RDW: 13.9 % (ref 11.2–14.5)
WBC: 9.3 10*3/uL (ref 3.9–10.3)
lymph#: 2.7 10*3/uL (ref 0.9–3.3)

## 2011-06-09 LAB — COMPREHENSIVE METABOLIC PANEL
ALT: 18 U/L (ref 0–35)
AST: 21 U/L (ref 0–37)
Albumin: 4 g/dL (ref 3.5–5.2)
Alkaline Phosphatase: 81 U/L (ref 39–117)
BUN: 12 mg/dL (ref 6–23)
CO2: 23 mEq/L (ref 19–32)
Calcium: 9.1 mg/dL (ref 8.4–10.5)
Chloride: 104 mEq/L (ref 96–112)
Creatinine, Ser: 0.78 mg/dL (ref 0.50–1.10)
Glucose, Bld: 120 mg/dL — ABNORMAL HIGH (ref 70–99)
Potassium: 4.3 mEq/L (ref 3.5–5.3)
Sodium: 137 mEq/L (ref 135–145)
Total Bilirubin: 0.4 mg/dL (ref 0.3–1.2)
Total Protein: 6.5 g/dL (ref 6.0–8.3)

## 2011-06-09 LAB — VITAMIN D 25 HYDROXY (VIT D DEFICIENCY, FRACTURES): Vit D, 25-Hydroxy: 75 ng/mL (ref 30–89)

## 2011-06-22 LAB — CARDIAC PANEL(CRET KIN+CKTOT+MB+TROPI)
CK, MB: 2.3
Relative Index: 2.2
Total CK: 103
Troponin I: 0.02

## 2011-06-22 LAB — COMPREHENSIVE METABOLIC PANEL
ALT: 23
AST: 25
Albumin: 3.6
Alkaline Phosphatase: 92
BUN: 8
CO2: 24
Calcium: 9.4
Chloride: 106
Creatinine, Ser: 0.7
GFR calc Af Amer: >60
GFR calc non Af Amer: >60
Glucose, Bld: 114 — ABNORMAL HIGH
Potassium: 3.5
Sodium: 139
Total Bilirubin: 0.3
Total Protein: 6.4

## 2011-06-22 LAB — DIFFERENTIAL
Basophils Absolute: 0
Basophils Absolute: 0.1
Basophils Relative: 0
Basophils Relative: 1
Eosinophils Absolute: 0.3
Eosinophils Absolute: 0.3
Eosinophils Relative: 2
Eosinophils Relative: 3
Lymphocytes Relative: 11 — ABNORMAL LOW
Lymphocytes Relative: 39
Lymphs Abs: 1.9
Lymphs Abs: 3.2
Monocytes Absolute: 0.4
Monocytes Absolute: 0.7
Monocytes Relative: 2 — ABNORMAL LOW
Monocytes Relative: 8
Neutro Abs: 14.9 — ABNORMAL HIGH
Neutro Abs: 4.1
Neutrophils Relative %: 49
Neutrophils Relative %: 85 — ABNORMAL HIGH

## 2011-06-22 LAB — CBC
HCT: 39.3
HCT: 39.7
Hemoglobin: 13.5
Hemoglobin: 13.7
MCHC: 34.3
MCHC: 34.6
MCV: 98.2
MCV: 98.9
Platelets: 367
Platelets: 415 — ABNORMAL HIGH
RBC: 3.97
RBC: 4.04
RDW: 13.6
RDW: 13.8
WBC: 17.4 — ABNORMAL HIGH
WBC: 8.4

## 2011-06-22 LAB — POCT CARDIAC MARKERS
CKMB, poc: 2
CKMB, poc: 2.5
Myoglobin, poc: 61.8
Myoglobin, poc: 76.6
Operator id: 294511
Operator id: 294511
Troponin i, poc: <0.05
Troponin i, poc: <0.05

## 2011-06-22 LAB — CULTURE, BLOOD (ROUTINE X 2)
Culture: NO GROWTH
Culture: NO GROWTH

## 2011-06-22 LAB — CK TOTAL AND CKMB (NOT AT ARMC)
CK, MB: 3.7
Relative Index: 2.8 — ABNORMAL HIGH
Total CK: 132

## 2011-06-22 LAB — TROPONIN I: Troponin I: 0.03

## 2011-06-22 LAB — BASIC METABOLIC PANEL
BUN: 5 — ABNORMAL LOW
CO2: 26
Calcium: 8.5
Chloride: 105
Creatinine, Ser: 0.73
GFR calc Af Amer: >60
GFR calc non Af Amer: >60
Glucose, Bld: 97
Potassium: 4.1
Sodium: 136

## 2011-06-22 LAB — HEMOGLOBIN A1C
Hgb A1c MFr Bld: 5.4
Mean Plasma Glucose: 115

## 2011-06-22 LAB — URINALYSIS, ROUTINE W REFLEX MICROSCOPIC
Bilirubin Urine: NEGATIVE
Glucose, UA: NEGATIVE
Hgb urine dipstick: NEGATIVE
Ketones, ur: NEGATIVE
Nitrite: NEGATIVE
Protein, ur: NEGATIVE
Specific Gravity, Urine: 1.008
Urobilinogen, UA: 0.2
pH: 6.5

## 2011-06-22 LAB — LIPID PANEL
Cholesterol: 175
HDL: 53
LDL Cholesterol: 103 — ABNORMAL HIGH
Total CHOL/HDL Ratio: 3.3
Triglycerides: 93
VLDL: 19

## 2011-06-22 LAB — TSH
TSH: 1.754
TSH: 2.421

## 2011-06-22 LAB — D-DIMER, QUANTITATIVE: D-Dimer, Quant: <0.22

## 2011-06-22 LAB — HOMOCYSTEINE: Homocysteine: 7.1

## 2011-09-07 ENCOUNTER — Other Ambulatory Visit: Payer: Self-pay | Admitting: Physician Assistant

## 2011-09-07 ENCOUNTER — Encounter: Payer: Self-pay | Admitting: Oncology

## 2011-09-07 ENCOUNTER — Ambulatory Visit (HOSPITAL_BASED_OUTPATIENT_CLINIC_OR_DEPARTMENT_OTHER): Admitting: Oncology

## 2011-09-07 ENCOUNTER — Other Ambulatory Visit (HOSPITAL_BASED_OUTPATIENT_CLINIC_OR_DEPARTMENT_OTHER): Admitting: Lab

## 2011-09-07 ENCOUNTER — Telehealth: Payer: Self-pay | Admitting: *Deleted

## 2011-09-07 DIAGNOSIS — C50919 Malignant neoplasm of unspecified site of unspecified female breast: Secondary | ICD-10-CM

## 2011-09-07 DIAGNOSIS — D0510 Intraductal carcinoma in situ of unspecified breast: Secondary | ICD-10-CM

## 2011-09-07 DIAGNOSIS — C50412 Malignant neoplasm of upper-outer quadrant of left female breast: Secondary | ICD-10-CM | POA: Insufficient documentation

## 2011-09-07 DIAGNOSIS — E039 Hypothyroidism, unspecified: Secondary | ICD-10-CM

## 2011-09-07 DIAGNOSIS — D0590 Unspecified type of carcinoma in situ of unspecified breast: Secondary | ICD-10-CM

## 2011-09-07 DIAGNOSIS — Z170 Estrogen receptor positive status [ER+]: Secondary | ICD-10-CM

## 2011-09-07 DIAGNOSIS — I82409 Acute embolism and thrombosis of unspecified deep veins of unspecified lower extremity: Secondary | ICD-10-CM

## 2011-09-07 DIAGNOSIS — F909 Attention-deficit hyperactivity disorder, unspecified type: Secondary | ICD-10-CM

## 2011-09-07 HISTORY — DX: Attention-deficit hyperactivity disorder, unspecified type: F90.9

## 2011-09-07 HISTORY — DX: Hypothyroidism, unspecified: E03.9

## 2011-09-07 HISTORY — DX: Intraductal carcinoma in situ of unspecified breast: D05.10

## 2011-09-07 HISTORY — DX: Acute embolism and thrombosis of unspecified deep veins of unspecified lower extremity: I82.409

## 2011-09-07 LAB — CBC WITH DIFFERENTIAL/PLATELET
BASO%: 1 % (ref 0.0–2.0)
Basophils Absolute: 0.1 10*3/uL (ref 0.0–0.1)
EOS%: 1.8 % (ref 0.0–7.0)
Eosinophils Absolute: 0.2 10*3/uL (ref 0.0–0.5)
HCT: 41.6 % (ref 34.8–46.6)
HGB: 14.3 g/dL (ref 11.6–15.9)
LYMPH%: 26 % (ref 14.0–49.7)
MCH: 33.8 pg (ref 25.1–34.0)
MCHC: 34.3 g/dL (ref 31.5–36.0)
MCV: 98.3 fL (ref 79.5–101.0)
MONO#: 0.6 10*3/uL (ref 0.1–0.9)
MONO%: 5.4 % (ref 0.0–14.0)
NEUT#: 7 10*3/uL — ABNORMAL HIGH (ref 1.5–6.5)
NEUT%: 65.8 % (ref 38.4–76.8)
Platelets: 378 10*3/uL (ref 145–400)
RBC: 4.23 10*6/uL (ref 3.70–5.45)
RDW: 13.4 % (ref 11.2–14.5)
WBC: 10.6 10*3/uL — ABNORMAL HIGH (ref 3.9–10.3)
lymph#: 2.7 10*3/uL (ref 0.9–3.3)

## 2011-09-07 LAB — COMPREHENSIVE METABOLIC PANEL
ALT: 25 U/L (ref 0–35)
AST: 26 U/L (ref 0–37)
Albumin: 4.4 g/dL (ref 3.5–5.2)
Alkaline Phosphatase: 93 U/L (ref 39–117)
BUN: 10 mg/dL (ref 6–23)
CO2: 27 mEq/L (ref 19–32)
Calcium: 9.3 mg/dL (ref 8.4–10.5)
Chloride: 102 mEq/L (ref 96–112)
Creatinine, Ser: 0.64 mg/dL (ref 0.50–1.10)
Glucose, Bld: 84 mg/dL (ref 70–99)
Potassium: 4.5 mEq/L (ref 3.5–5.3)
Sodium: 138 mEq/L (ref 135–145)
Total Bilirubin: 0.4 mg/dL (ref 0.3–1.2)
Total Protein: 6.3 g/dL (ref 6.0–8.3)

## 2011-09-07 LAB — CANCER ANTIGEN 27.29: CA 27.29: 22 U/mL (ref 0–39)

## 2011-09-07 LAB — LACTATE DEHYDROGENASE: LDH: 163 U/L (ref 94–250)

## 2011-09-07 NOTE — Telephone Encounter (Signed)
gave patient appointments printed out calendar and gave to the patient

## 2011-09-07 NOTE — Progress Notes (Signed)
OFFICE PROGRESS NOTE  CC Dr. Manus Rudd Dr. 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 She had an ultrasound that showed a small mass measuring 5 x 9 mm at about the 3:30 position. She subsequently had a biopsy performed that showed a ductal carcinoma in situ. The tumor was ER +54% PR +74%.  #2 she then had a lumpectomy on 01/30/2007. The final pathology on the lumpectomy specimen showed a high-grade ductal carcinoma in situ with focal necrosis without evidence of invasive cancer. The DCIS component measured 1.7 cm.  #3 she then underwent radiation therapy under the care of Dr. Dorna Bloom she received the radiation from 03/26/2007 2 05/13/2007  #4 she then was started on adjuvant Arimidex 1 mg daily in September 2008  CURRENT THERAPY: Arimidex 1 mg daily  INTERVAL HISTORY: [name] 55 y.o. female returns for followup visit today. She was last seen by me in September 2012. Overall she seems to be doing quite well. She is tolerating the Arimidex well she denies any fevers chills night sweats headaches shortness of breath chest pains palpitations she has no myalgias or arthralgias. She is planning on having some dental work performed. She has no vaginal bleeding no discharge. Remainder of the 10 point review of systems is negative.  MEDICAL HISTORY: Past Medical History  Diagnosis Date  . 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 ADHD (attention deficit hyperactivity disorder) 09/07/2011  . DVT (deep venous thrombosis) 09/07/2011  . Hypothyroid 09/07/2011    ALLERGIES:   has no known allergies.  MEDICATIONS:  Current Outpatient Prescriptions  Medication Sig Dispense Refill  . amphetamine-dextroamphetamine  (ADDERALL) 10 MG tablet Take 10 mg by mouth daily.        Marland Kitchen anastrozole (ARIMIDEX) 1 MG tablet Take 1 mg by mouth daily.        . B Complex-C (B-COMPLEX WITH VITAMIN C) tablet Take 1 tablet by mouth daily.        . calcium-vitamin D (OSCAL WITH D) 500-200 MG-UNIT per tablet Take 1 tablet by mouth 2 (two) times daily.        Marland Kitchen levothyroxine (SYNTHROID, LEVOTHROID) 75 MCG tablet Take 75 mcg by mouth daily.        . Multiple Vitamin (MULTIVITAMIN) tablet Take 1 tablet by mouth daily.        . rosuvastatin (CRESTOR) 10 MG tablet Take 10 mg by mouth daily.          SURGICAL HISTORY: No past surgical history on file.  REVIEW OF SYSTEMS:  Pertinent items are noted in HPI.   PHYSICAL EXAMINATION: General appearance: alert, cooperative and appears stated age Head: Normocephalic, without obvious abnormality, atraumatic Neck: no adenopathy, no carotid bruit, no JVD, supple, symmetrical, trachea midline and thyroid not enlarged, symmetric, no tenderness/mass/nodules Lymph nodes: Cervical, supraclavicular, and axillary nodes normal. Resp: clear to auscultation bilaterally and normal percussion bilaterally Back: symmetric, no curvature. ROM normal. No CVA tenderness. Cardio: regular rate and rhythm, S1, S2 normal, no murmur, click, rub or gallop and normal apical impulse GI: soft, non-tender; bowel sounds normal; no masses,  no organomegaly Extremities: extremities normal, atraumatic, no cyanosis or edema Neurologic: Alert and oriented X 3, normal strength and tone. Normal symmetric reflexes. Normal coordination and  gait Bilateral breasts are examined right breast no masses nipple discharge or skin changes left breast revealed well-healed surgical scar in the outer lower quadrant there is no nipple discharge no dominant masses ECOG PERFORMANCE STATUS: 1 - Symptomatic but completely ambulatory  Blood pressure 152/84, pulse 77, temperature 98.2 F (36.8 C), temperature source Oral, height 5' 5.5" (1.664  m), weight 130 lb 14.4 oz (59.376 kg).  LABORATORY DATA: Lab Results  Component Value Date   WBC 10.6* 09/07/2011   HGB 14.3 09/07/2011   HCT 41.6 09/07/2011   MCV 98.3 09/07/2011   PLT 378 09/07/2011      Chemistry      Component Value Date/Time   NA 137 06/08/2011 1038   NA 137 06/08/2011 1038   K 4.3 06/08/2011 1038   K 4.3 06/08/2011 1038   CL 104 06/08/2011 1038   CL 104 06/08/2011 1038   CO2 23 06/08/2011 1038   CO2 23 06/08/2011 1038   BUN 12 06/08/2011 1038   BUN 12 06/08/2011 1038   CREATININE 0.78 06/08/2011 1038   CREATININE 0.78 06/08/2011 1038      Component Value Date/Time   CALCIUM 9.1 06/08/2011 1038   CALCIUM 9.1 06/08/2011 1038   ALKPHOS 81 06/08/2011 1038   ALKPHOS 81 06/08/2011 1038   AST 21 06/08/2011 1038   AST 21 06/08/2011 1038   ALT 18 06/08/2011 1038   ALT 18 06/08/2011 1038   BILITOT 0.4 06/08/2011 1038   BILITOT 0.4 06/08/2011 1038       RADIOGRAPHIC STUDIES:  No results found.  ASSESSMENT: 55 year old female with high-grade DCIS measuring 1.7 cm of the left breast. Patient is status post lumpectomy followed by radiation therapy for ER-positive PR-positive DCIS. She overall is tolerating everything quite well. She remains on Arimidex 1 mg daily since September 2008. She essentially will be done for with 5 years of therapy in September 2013.   PLAN: I will plan on seeing the patient back in 6 months time in followup. However she knows to call with any problems questions or concerns.   All questions were answered. The patient knows to call the clinic with any problems, questions or concerns. We can certainly see the patient much sooner if necessary.  I spent 20 minutes counseling the patient face to face. The total time spent in the appointment was 30 minutes.    Drue Second, MD Medical/Oncology [facility] [phone] (beeper) [phone] (Office)  09/07/2011, 11:13 AM

## 2011-10-03 HISTORY — PX: BREAST BIOPSY: SHX20

## 2011-12-01 HISTORY — PX: BREAST LUMPECTOMY: SHX2

## 2012-01-12 ENCOUNTER — Other Ambulatory Visit: Payer: Self-pay | Admitting: Family Medicine

## 2012-01-12 DIAGNOSIS — Z853 Personal history of malignant neoplasm of breast: Secondary | ICD-10-CM

## 2012-02-14 ENCOUNTER — Ambulatory Visit
Admission: RE | Admit: 2012-02-14 | Discharge: 2012-02-14 | Disposition: A | Discharge status: Home / Self Care | Source: Ambulatory Visit | Attending: Family Medicine | Admitting: Family Medicine

## 2012-02-14 DIAGNOSIS — Z853 Personal history of malignant neoplasm of breast: Secondary | ICD-10-CM

## 2012-03-07 ENCOUNTER — Ambulatory Visit (HOSPITAL_BASED_OUTPATIENT_CLINIC_OR_DEPARTMENT_OTHER): Admitting: Oncology

## 2012-03-07 ENCOUNTER — Telehealth: Payer: Self-pay | Admitting: Oncology

## 2012-03-07 ENCOUNTER — Other Ambulatory Visit (HOSPITAL_BASED_OUTPATIENT_CLINIC_OR_DEPARTMENT_OTHER): Admitting: Lab

## 2012-03-07 ENCOUNTER — Encounter: Payer: Self-pay | Admitting: Oncology

## 2012-03-07 ENCOUNTER — Ambulatory Visit: Admitting: Oncology

## 2012-03-07 VITALS — BP 126/76 | HR 80 | Temp 98.5°F | Ht 65.5 in | Wt 133.3 lb

## 2012-03-07 DIAGNOSIS — D0510 Intraductal carcinoma in situ of unspecified breast: Secondary | ICD-10-CM

## 2012-03-07 DIAGNOSIS — D0590 Unspecified type of carcinoma in situ of unspecified breast: Secondary | ICD-10-CM

## 2012-03-07 DIAGNOSIS — I82409 Acute embolism and thrombosis of unspecified deep veins of unspecified lower extremity: Secondary | ICD-10-CM

## 2012-03-07 DIAGNOSIS — F909 Attention-deficit hyperactivity disorder, unspecified type: Secondary | ICD-10-CM

## 2012-03-07 DIAGNOSIS — Z170 Estrogen receptor positive status [ER+]: Secondary | ICD-10-CM

## 2012-03-07 DIAGNOSIS — E039 Hypothyroidism, unspecified: Secondary | ICD-10-CM

## 2012-03-07 LAB — CBC WITH DIFFERENTIAL/PLATELET
BASO%: 0.8 % (ref 0.0–2.0)
Basophils Absolute: 0.1 10*3/uL (ref 0.0–0.1)
EOS%: 2.3 % (ref 0.0–7.0)
Eosinophils Absolute: 0.2 10*3/uL (ref 0.0–0.5)
HCT: 38.7 % (ref 34.8–46.6)
HGB: 12.9 g/dL (ref 11.6–15.9)
LYMPH%: 27.7 % (ref 14.0–49.7)
MCH: 32.1 pg (ref 25.1–34.0)
MCHC: 33.3 g/dL (ref 31.5–36.0)
MCV: 96.4 fL (ref 79.5–101.0)
MONO#: 0.6 10*3/uL (ref 0.1–0.9)
MONO%: 6.1 % (ref 0.0–14.0)
NEUT#: 6.4 10*3/uL (ref 1.5–6.5)
NEUT%: 63.1 % (ref 38.4–76.8)
Platelets: 413 10*3/uL — ABNORMAL HIGH (ref 145–400)
RBC: 4.01 10*6/uL (ref 3.70–5.45)
RDW: 14 % (ref 11.2–14.5)
WBC: 10.1 10*3/uL (ref 3.9–10.3)
lymph#: 2.8 10*3/uL (ref 0.9–3.3)
nRBC: 0 % (ref 0–0)

## 2012-03-07 LAB — COMPREHENSIVE METABOLIC PANEL
ALT: 22 U/L (ref 0–35)
AST: 24 U/L (ref 0–37)
Albumin: 4.1 g/dL (ref 3.5–5.2)
Alkaline Phosphatase: 81 U/L (ref 39–117)
BUN: 11 mg/dL (ref 6–23)
CO2: 23 mEq/L (ref 19–32)
Calcium: 9.4 mg/dL (ref 8.4–10.5)
Chloride: 106 mEq/L (ref 96–112)
Creatinine, Ser: 0.64 mg/dL (ref 0.50–1.10)
Glucose, Bld: 85 mg/dL (ref 70–99)
Potassium: 4.5 mEq/L (ref 3.5–5.3)
Sodium: 138 mEq/L (ref 135–145)
Total Bilirubin: 0.3 mg/dL (ref 0.3–1.2)
Total Protein: 6.4 g/dL (ref 6.0–8.3)

## 2012-03-07 NOTE — Telephone Encounter (Signed)
gve the pt her oct 2013 appt calendar °

## 2012-03-07 NOTE — Patient Instructions (Signed)
Continue arimidex 1 mg daily until you see me back in October 2013

## 2012-03-07 NOTE — Progress Notes (Signed)
OFFICE PROGRESS NOTE  CC Dr. Manus Rudd Dr. 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 She had an ultrasound that showed a small mass measuring 5 x 9 mm at about the 3:30 position. She subsequently had a biopsy performed that showed a ductal carcinoma in situ. The tumor was ER +54% PR +74%.  #2 she then had a lumpectomy on 01/30/2007. The final pathology on the lumpectomy specimen showed a high-grade ductal carcinoma in situ with focal necrosis without evidence of invasive cancer. The DCIS component measured 1.7 cm.  #3 she then underwent radiation therapy under the care of Dr. Dorna Bloom she received the radiation from 03/26/2007 2 05/13/2007  #4 she then was started on adjuvant Arimidex 1 mg daily in September 2008  CURRENT THERAPY: Arimidex 1 mg daily  INTERVAL HISTORY: [name] 56 y.o. female returns for followup visit today. She was last seen by me about 6 months ago.. Overall she seems to be doing quite well. She is tolerating the Arimidex well she denies any fevers chills night sweats headaches shortness of breath chest pains palpitations she has no myalgias or arthralgias.She has no vaginal bleeding no discharge. Remainder of the 10 point review of systems is negative.  MEDICAL HISTORY: Past Medical History  Diagnosis Date  . 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 ADHD (attention deficit hyperactivity disorder) 09/07/2011  . DVT (deep venous thrombosis) 09/07/2011  . Hypothyroid 09/07/2011    ALLERGIES:   has no known allergies.  MEDICATIONS:  Current Outpatient Prescriptions  Medication Sig Dispense Refill  . amphetamine-dextroamphetamine (ADDERALL) 10 MG tablet Take 10 mg by mouth daily.          Marland Kitchen anastrozole (ARIMIDEX) 1 MG tablet Take 1 mg by mouth daily.        . B Complex-C (B-COMPLEX WITH VITAMIN C) tablet Take 1 tablet by mouth daily.        . cholecalciferol (VITAMIN D) 1000 UNITS tablet Take 1,000 Units by mouth daily.      Marland Kitchen levothyroxine (SYNTHROID, LEVOTHROID) 75 MCG tablet Take 75 mcg by mouth daily.        . Multiple Vitamin (MULTIVITAMIN) tablet Take 1 tablet by mouth daily.        . rosuvastatin (CRESTOR) 10 MG tablet Take 10 mg by mouth daily.        . calcium-vitamin D (OSCAL WITH D) 500-200 MG-UNIT per tablet Take 1 tablet by mouth 2 (two) times daily.          SURGICAL HISTORY: History reviewed. No pertinent past surgical history.  REVIEW OF SYSTEMS:  Pertinent items are noted in HPI.   PHYSICAL EXAMINATION: General appearance: alert, cooperative and appears stated age Head: Normocephalic, without obvious abnormality, atraumatic Neck: no adenopathy, no carotid bruit, no JVD, supple, symmetrical, trachea midline and thyroid not enlarged, symmetric, no tenderness/mass/nodules Lymph nodes: Cervical, supraclavicular, and axillary nodes normal. Resp: clear to auscultation bilaterally and normal percussion bilaterally Back: symmetric, no curvature. ROM normal. No CVA tenderness. Cardio: regular rate and rhythm, S1, S2 normal, no murmur, click, rub or gallop and normal apical impulse GI: soft, non-tender; bowel sounds normal; no masses,  no organomegaly Extremities: extremities normal, atraumatic, no cyanosis or edema Neurologic: Alert and oriented X  3, normal strength and tone. Normal symmetric reflexes. Normal coordination and gait Bilateral breasts are examined right breast no masses nipple discharge or skin changes left breast revealed well-healed surgical scar in the outer lower quadrant there is no nipple discharge no dominant masses ECOG PERFORMANCE STATUS: 1 - Symptomatic but completely ambulatory  Blood pressure 126/76, pulse 80, temperature 98.5 F (36.9  C), temperature source Oral, height 5' 5.5" (1.664 m), weight 133 lb 4.8 oz (60.464 kg).  LABORATORY DATA: Lab Results  Component Value Date   WBC 10.1 03/07/2012   HGB 12.9 03/07/2012   HCT 38.7 03/07/2012   MCV 96.4 03/07/2012   PLT 413* 03/07/2012      Chemistry      Component Value Date/Time   NA 138 09/07/2011 1030   K 4.5 09/07/2011 1030   CL 102 09/07/2011 1030   CO2 27 09/07/2011 1030   BUN 10 09/07/2011 1030   CREATININE 0.64 09/07/2011 1030      Component Value Date/Time   CALCIUM 9.3 09/07/2011 1030   ALKPHOS 93 09/07/2011 1030   AST 26 09/07/2011 1030   ALT 25 09/07/2011 1030   BILITOT 0.4 09/07/2011 1030       RADIOGRAPHIC STUDIES:  No results found.  ASSESSMENT: 56 year old female with high-grade DCIS measuring 1.7 cm of the left breast. Patient is status post lumpectomy followed by radiation therapy for ER-positive PR-positive DCIS. She overall is tolerating everything quite well. She remains on Arimidex 1 mg daily since September 2008. She essentially will be done for with 5 years of therapy in September 2013.   PLAN: I will plan on seeing the patient back in 6 months time in followup. However she knows to call with any problems questions or concerns.   All questions were answered. The patient knows to call the clinic with any problems, questions or concerns. We can certainly see the patient much sooner if necessary.  I spent 20 minutes counseling the patient face to face. The total time spent in the appointment was 30 minutes.    Drue Second, MD Medical/Oncology [facility] [phone] (beeper) [phone] (Office)  03/07/2012, 1:08 PM

## 2012-05-13 ENCOUNTER — Telehealth: Payer: Self-pay | Admitting: *Deleted

## 2012-05-13 NOTE — Telephone Encounter (Signed)
patient called in to reschedule her appointment for something closer gave patient 07-03-2012 starting at 2:00pm

## 2012-07-03 ENCOUNTER — Ambulatory Visit (HOSPITAL_BASED_OUTPATIENT_CLINIC_OR_DEPARTMENT_OTHER): Admitting: Oncology

## 2012-07-03 ENCOUNTER — Telehealth: Payer: Self-pay | Admitting: *Deleted

## 2012-07-03 ENCOUNTER — Other Ambulatory Visit (HOSPITAL_BASED_OUTPATIENT_CLINIC_OR_DEPARTMENT_OTHER): Admitting: Lab

## 2012-07-03 VITALS — BP 143/82 | HR 91 | Temp 99.3°F | Resp 20 | Ht 65.5 in | Wt 129.9 lb

## 2012-07-03 DIAGNOSIS — D0510 Intraductal carcinoma in situ of unspecified breast: Secondary | ICD-10-CM

## 2012-07-03 DIAGNOSIS — I82409 Acute embolism and thrombosis of unspecified deep veins of unspecified lower extremity: Secondary | ICD-10-CM

## 2012-07-03 DIAGNOSIS — D0590 Unspecified type of carcinoma in situ of unspecified breast: Secondary | ICD-10-CM

## 2012-07-03 DIAGNOSIS — Z170 Estrogen receptor positive status [ER+]: Secondary | ICD-10-CM

## 2012-07-03 LAB — CBC WITH DIFFERENTIAL/PLATELET
BASO%: 0.7 % (ref 0.0–2.0)
Basophils Absolute: 0.1 10*3/uL (ref 0.0–0.1)
EOS%: 3.7 % (ref 0.0–7.0)
Eosinophils Absolute: 0.4 10*3/uL (ref 0.0–0.5)
HCT: 38.4 % (ref 34.8–46.6)
HGB: 12.9 g/dL (ref 11.6–15.9)
LYMPH%: 33.5 % (ref 14.0–49.7)
MCH: 32.8 pg (ref 25.1–34.0)
MCHC: 33.6 g/dL (ref 31.5–36.0)
MCV: 97.6 fL (ref 79.5–101.0)
MONO#: 0.8 10*3/uL (ref 0.1–0.9)
MONO%: 7.1 % (ref 0.0–14.0)
NEUT#: 5.9 10*3/uL (ref 1.5–6.5)
NEUT%: 55 % (ref 38.4–76.8)
Platelets: 442 10*3/uL — ABNORMAL HIGH (ref 145–400)
RBC: 3.93 10*6/uL (ref 3.70–5.45)
RDW: 13.6 % (ref 11.2–14.5)
WBC: 10.7 10*3/uL — ABNORMAL HIGH (ref 3.9–10.3)
lymph#: 3.6 10*3/uL — ABNORMAL HIGH (ref 0.9–3.3)

## 2012-07-03 LAB — COMPREHENSIVE METABOLIC PANEL (CC13)
ALT: 22 U/L (ref 0–55)
AST: 22 U/L (ref 5–34)
Albumin: 3.6 g/dL (ref 3.5–5.0)
Alkaline Phosphatase: 95 U/L (ref 40–150)
BUN: 16 mg/dL (ref 7.0–26.0)
CO2: 21 mEq/L — ABNORMAL LOW (ref 22–29)
Calcium: 9.5 mg/dL (ref 8.4–10.4)
Chloride: 105 mEq/L (ref 98–107)
Creatinine: 0.8 mg/dL (ref 0.6–1.1)
Glucose: 120 mg/dl — ABNORMAL HIGH (ref 70–99)
Potassium: 4.1 mEq/L (ref 3.5–5.1)
Sodium: 138 mEq/L (ref 136–145)
Total Bilirubin: 0.3 mg/dL (ref 0.20–1.20)
Total Protein: 6.3 g/dL — ABNORMAL LOW (ref 6.4–8.3)

## 2012-07-03 NOTE — Patient Instructions (Addendum)
Doing weel  Congratulations you have completed your 5 years of arimidex.  I will continue to see you on a yearly basis  Breast Cancer Survivor Follow-Up Breast cancer begins when cells in the breast divide too rapidly. The extra cells form a lump (tumor). When the cancer is treated, the goal is to get rid of all cancer cells. However, sometimes a few cells survive. These cancer cells can then grow. They become recurrent cancer. This means the cancer comes back after treatment.  Most cases of recurrent breast cancer develop 3 to 5 years after treatment. However, sometimes it comes back just a few months after treatment. Other times, it does not come back until years later. If the cancer comes back in the same area as the first breast cancer, it is called a local recurrence. If the cancer comes back somewhere else in the body, it is called regional recurrence if the site is fairly near the breast or distant recurrence if it is far from the breast. Your caregiver may also use the term metastasize to indicate a cancer that has gone to another part of your body. Treatment is still possible after either kind of recurrence. The cancer can still be controlled.  CAUSES OF RECURRENT CANCER No one knows exactly why breast cancer starts in the first place. Why the cancer comes back after treatment is also not clear. It is known that certain conditions, called risk factors, can make this more likely. They include:  Developing breast cancer for the first time before age 2.  Having breast cancer that involves the lymph nodes. These are small, round pieces of tissue found all over the body. Their job is to help fight infections.  Having a large tumor. Cancer is more apt to come back if the first tumor was bigger than 2 inches (5 cm).  Having certain types of breast cancer, such as:  Inflammatory breast cancer. This rare type grows rapidly and causes the breast to become red and swollen.  A high-grade tumor.  The grade of a tumor indicates how fast it will grow and spread. High-grade tumors grow more quickly than other types.  HER2 cancer. This refers to the tumor's genetic makeup. Tumors that have this type of gene are more likely to come back after treatment.  Having close tumor margins. This refers to the space between the tumor and normal, noncancerous cells. If the space is small, the tumor has a greater chance of coming back.  Having treatment involving a surgery to remove the tumor but not the entire breast (lumpectomy) and no radiation therapy. CARE AFTER BREAST CANCER Home Monitoring Women who have had breast cancer should continue to examine their breasts every month. The goal is to catch the cancer quickly if it comes back. Many women find it helpful to do so on the same day each month and to mark the calendar as a reminder. Let your caregiver know immediately if you have any signs of recurrent breast cancer. Symptoms will vary, depending on where the cancer recurs. The original type of treatment can also make a difference. Symptoms of local recurrence after a lumpectomy or a recurrence in the opposite breast may include:  A new lump or thickening in the breast.  A change in the way the skin looks on the breast (such as a rash, dimpling, or wrinkling).  Redness or swelling of the breast.  Changes in the nipple (such as being red, puckered, swollen, or leaking fluid). Symptoms of a recurrence  after a breast removal surgery (mastectomy) may include:  A lump or thickening under the skin.  A thickening around the mastectomy scar. Symptoms of regional recurrence in the lymph nodes near the breast may include:  A lump under the arm or above the collarbone.  Swelling of the arm.  Pain in the arm, shoulder, or chest.  Numbness in the hand or arm. Symptoms of distant recurrence may include:  A cough that does not go away.  Trouble breathing or shortness of breath.  Pain in the  bones or the chest. This is pain that lasts or does not respond to rest and medicine.  Headaches.  Sudden vision problems.  Dizziness.  Nausea or vomiting.  Losing weight without trying to.  Persistent abdominal pain.  Changes in bowel movements or blood in the stool.  Yellowing of the skin or eyes (jaundice).  Blood in the urine or bloody vaginal discharge. Clinical Monitoring  It is helpful to keep a schedule of appointments for needed tests and exams. This includes physical exams, breast exams, exams of the lymph nodes, and general exams.  For the first 3 years after being treated for breast cancer, see your caregiver every 3 to 6 months.  For years 4 and 5 after breast cancer, see your caregiver every 6 to 12 months.  After 5 years, see your caregiver at least once a year.  Regular breast X-rays (mammograms) should continue even if you had a mastectomy.  A mammogram should be done 1 year after the mammogram that first detected breast cancer.  A mammogram should be done every 6 to 12 months after that. Follow your caregiver's advice.  A pelvic exam done by your caregiver checks whether female organs are the normal size and shape. The exam is usually done every year. Ask your caregiver if that schedule is right for you.  Women taking tamoxifen should report any vaginal bleeding immediately to their caregiver. Tamoxifen is often given to women with a certain type of breast cancer. It has been shown to help prevent recurrence.  You will need to decide who your primary caregiver will be.  Most people continue to see their cancer specialist (oncologist) every 3 to 6 months for the first year after cancer treatment.  At some point, you may want to go back to seeing your family caregiver. You would no longer see your oncologist for regular checkups. Many women do this about 1 year after their first diagnosis of breast cancer.  You will still need to be seen every so often by  your oncologist. Ask how often that should be. Coordinate this with your family or primary caregiver.  Think about having genetic counseling. This would provide information on traits that can be passed or inherited from one generation to the next. In some cases, breast cancer runs in families. Tell your caregiver if you:  Are of Ashkenazi Jewish heritage.  Have any family member who has had ovarian cancer.  Have a mother, sister, or daughter who had breast cancer before age 53.  Have 2 or more close relatives who have had breast cancer. This means a mother, sister, daughter, aunt, or grandmother.  Had breast cancer in both breasts.  Have a female relative who has had breast cancer.  Some tests are not recommended for routine screening. Someone recovering from breast cancer does not need to have these tests if there are no problems. The tests have risks, such as radiation exposure, and can be costly. The risks  of these tests are thought to be greater than the benefits:  Blood tests.  Chest X-rays.  Bone scans.  Liver ultrasound.  Computed tomography (CT scan).  Positron emission tomography (PET scan).  Magnetic resonance imaging (MRI scan). DIAGNOSIS OF RECURRENT CANCER Recurrent breast cancer may be suspected for various reasons. A mammogram may not look normal. You might feel a lump or have other symptoms. Your caregiver may find something unusual during an exam. To be sure, your caregiver will probably order some tests. The tests are needed because there are symptoms or hints of a problem. They could include:  Blood tests, including a test to check how well the liver is working. The liver is a common site for a distant cancer recurrence.  Imaging tests that create pictures of the inside of the body. These tests include:  Chest X-rays to show if the cancer has come back in the lungs.  CT scans to create detailed pictures of various areas of the body and help find a distant  recurrence.  MRI scans to find anything unusual in the breast, chest, or lymph nodes.  Breast ultrasound tests to examine the breasts.  Bone scans to create a picture of your whole skeleton and find cancer in bony areas.  PET scans to create an image of the whole body. PET scans can be used together with CT scans to show more detail.  Biopsy. A small sample of tissue is taken and checked under a microscope. If cancer cells are found, they may be tested to see if they contain the HER2 gene or the hormones estrogen and progesterone. This will help your caregiver decide how to treat the recurrent cancer. TREATMENT  How recurrent breast cancer is treated depends on where the new cancer is found. The type of treatment that was used for the first breast cancer makes a difference, too. A combination of treatments may be used. Options include:  Surgery.  If the cancer comes back in the breast that was not treated before, you may need a lumpectomy or mastectomy.  If the cancer comes back in the breast that was treated before, you may need a mastectomy.  The lymph nodes under the arm may need to be removed.  Radiation therapy.  For a local recurrence, radiation may be used if it was not used during the first treatment.  For a distance recurrence, radiation is sometimes used.  Chemotherapy.  This may be used before surgery to treat recurrent breast cancer.  This may be used to treat recurrent cancer that cannot be treated with surgery.  This may be used to treat a distant recurrence.  Hormone therapy.  Women with the HER2 gene may be given hormone therapy to attack this gene. Document Released: 05/17/2011 Document Revised: 12/11/2011 Document Reviewed: 05/17/2011 [facility] Patient Information 2013 [place], [place].

## 2012-07-03 NOTE — Telephone Encounter (Signed)
Gave patient appointment for 07-03-2013 starting at 10:30am

## 2012-07-03 NOTE — Progress Notes (Signed)
OFFICE PROGRESS NOTE  CC Dr. Manus Rudd Dr. 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 She had an ultrasound that showed a small mass measuring 5 x 9 mm at about the 3:30 position. She subsequently had a biopsy performed that showed a ductal carcinoma in situ. The tumor was ER +54% PR +74%.  #2 she then had a lumpectomy on 01/30/2007. The final pathology on the lumpectomy specimen showed a high-grade ductal carcinoma in situ with focal necrosis without evidence of invasive cancer. The DCIS component measured 1.7 cm.  #3 she then underwent radiation therapy under the care of Dr. Dorna Bloom she received the radiation from 03/26/2007 2 05/13/2007  #4 she then was started on adjuvant Arimidex 1 mg daily in September 2008  CURRENT THERAPY: Arimidex 1 mg daily  INTERVAL HISTORY: [name] 56 y.o. female returns for followup visit today. She was last seen by me about 6 months ago.. Overall she seems to be doing quite well. She is tolerating the Arimidex well she denies any fevers chills night sweats headaches shortness of breath chest pains palpitations she has no myalgias or arthralgias.She has no vaginal bleeding no discharge. Remainder of the 10 point review of systems is negative.  MEDICAL HISTORY: Past Medical History  Diagnosis Date  . 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 ADHD (attention deficit hyperactivity disorder) 09/07/2011  . DVT (deep venous thrombosis) 09/07/2011  . Hypothyroid 09/07/2011    ALLERGIES:  is allergic to penicillins.  MEDICATIONS:  Current Outpatient Prescriptions  Medication Sig Dispense Refill  . amphetamine-dextroamphetamine (ADDERALL) 10 MG tablet Take 10 mg by mouth daily.         Marland Kitchen anastrozole (ARIMIDEX) 1 MG tablet Take 1 mg by mouth daily.        . B Complex-C (B-COMPLEX WITH VITAMIN C) tablet Take 1 tablet by mouth daily.        . cholecalciferol (VITAMIN D) 1000 UNITS tablet Take 1,000 Units by mouth daily.      Marland Kitchen levothyroxine (SYNTHROID, LEVOTHROID) 75 MCG tablet Take 75 mcg by mouth daily.        . Multiple Vitamin (MULTIVITAMIN) tablet Take 1 tablet by mouth daily.        . rosuvastatin (CRESTOR) 10 MG tablet Take 10 mg by mouth daily.          SURGICAL HISTORY: No past surgical history on file.  REVIEW OF SYSTEMS:  Pertinent items are noted in HPI.   PHYSICAL EXAMINATION: General appearance: alert, cooperative and appears stated age Head: Normocephalic, without obvious abnormality, atraumatic Neck: no adenopathy, no carotid bruit, no JVD, supple, symmetrical, trachea midline and thyroid not enlarged, symmetric, no tenderness/mass/nodules Lymph nodes: Cervical, supraclavicular, and axillary nodes normal. Resp: clear to auscultation bilaterally and normal percussion bilaterally Back: symmetric, no curvature. ROM normal. No CVA tenderness. Cardio: regular rate and rhythm, S1, S2 normal, no murmur, click, rub or gallop and normal apical impulse GI: soft, non-tender; bowel sounds normal; no masses,  no organomegaly Extremities: extremities normal, atraumatic, no cyanosis or edema Neurologic: Alert and oriented X 3, normal strength and tone. Normal symmetric reflexes. Normal coordination and gait Bilateral breasts are examined right breast no masses nipple discharge or skin changes left breast revealed well-healed  surgical scar in the outer lower quadrant there is no nipple discharge no dominant masses ECOG PERFORMANCE STATUS: 1 - Symptomatic but completely ambulatory  Blood pressure 143/82, pulse 91, temperature 99.3 F (37.4 C), temperature source Oral, resp. rate 20, height 5' 5.5" (1.664 m), weight 129 lb 14.4 oz (58.922 kg).  LABORATORY DATA: Lab  Results  Component Value Date   WBC 10.7* 07/03/2012   HGB 12.9 07/03/2012   HCT 38.4 07/03/2012   MCV 97.6 07/03/2012   PLT 442* 07/03/2012      Chemistry      Component Value Date/Time   NA 138 03/07/2012 1206   K 4.5 03/07/2012 1206   CL 106 03/07/2012 1206   CO2 23 03/07/2012 1206   BUN 11 03/07/2012 1206   CREATININE 0.64 03/07/2012 1206      Component Value Date/Time   CALCIUM 9.4 03/07/2012 1206   ALKPHOS 81 03/07/2012 1206   AST 24 03/07/2012 1206   ALT 22 03/07/2012 1206   BILITOT 0.3 03/07/2012 1206       RADIOGRAPHIC STUDIES:  No results found.  ASSESSMENT: 56 year old female with high-grade DCIS measuring 1.7 cm of the left breast. Patient is status post lumpectomy followed by radiation therapy for ER-positive PR-positive DCIS. She overall is tolerating everything quite well. She remains on Arimidex 1 mg daily since September 2008. Patient has now completed 5 years of Arimidex. She will not come off of this. We discussed survivorship. We also discussed the importance of continuing to exercise eating healthy self breast examination and continuing to get diagnostic mammograms.  PLAN:   #1 patient now will see me on a yearly basis.  #2 I have encouraged her to continue to see her primary care physician as well for ongoing general health. We discussed exercise eating healthy. Literature and survivorship was given to her as well.   All questions were answered. The patient knows to call the clinic with any problems, questions or concerns. We can certainly see the patient much sooner if necessary.  I spent 25 minutes counseling the patient face to face. The total time spent in the appointment was 30 minutes.    Drue Second, MD Medical/Oncology [facility] [phone] (beeper) [phone] (Office)  07/03/2012, 3:07 PM

## 2012-07-19 ENCOUNTER — Ambulatory Visit: Admitting: Oncology

## 2012-07-19 ENCOUNTER — Other Ambulatory Visit: Admitting: Lab

## 2012-08-21 ENCOUNTER — Encounter: Payer: Self-pay | Admitting: Vascular Surgery

## 2012-09-18 ENCOUNTER — Other Ambulatory Visit: Payer: Self-pay | Admitting: *Deleted

## 2012-09-18 DIAGNOSIS — I6529 Occlusion and stenosis of unspecified carotid artery: Secondary | ICD-10-CM

## 2012-09-20 ENCOUNTER — Encounter: Payer: Self-pay | Admitting: Neurosurgery

## 2012-09-23 ENCOUNTER — Ambulatory Visit (INDEPENDENT_AMBULATORY_CARE_PROVIDER_SITE_OTHER): Admitting: Neurosurgery

## 2012-09-23 ENCOUNTER — Encounter: Payer: Self-pay | Admitting: Neurosurgery

## 2012-09-23 ENCOUNTER — Other Ambulatory Visit (INDEPENDENT_AMBULATORY_CARE_PROVIDER_SITE_OTHER): Admitting: *Deleted

## 2012-09-23 VITALS — BP 138/82 | HR 75 | Resp 16 | Ht 65.5 in | Wt 130.0 lb

## 2012-09-23 DIAGNOSIS — I6529 Occlusion and stenosis of unspecified carotid artery: Secondary | ICD-10-CM | POA: Insufficient documentation

## 2012-09-23 NOTE — Progress Notes (Signed)
[facility] Carotid Office Note  CC: Carotid surveillance Referring Physician: Brabham  History of Present Illness: 56 year old female patient of Dr. Myra Gianotti with no history of carotid intervention. The patient denies any signs or symptoms of CVA, TIA, amaurosis fugax or any neural deficit.  Past Medical History  Diagnosis Date  . 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 ADHD (attention deficit hyperactivity disorder) 09/07/2011  . DVT (deep venous thrombosis) 09/07/2011  . Hypothyroid 09/07/2011  . Coronary artery disease     ROS: [x]  Positive   [ ]  Denies    General: [ ]  Weight loss, [ ]  Fever, [ ]  chills Neurologic: [ ]  Dizziness, [ ]  Blackouts, [ ]  Seizure [ ]  Stroke, [ ]  "Mini stroke", [ ]  Slurred speech, [ ]  Temporary blindness; [ ]  weakness in arms or legs, [ ]  Hoarseness Cardiac: [ ]  Chest pain/pressure, [ ]  Shortness of breath at rest [ ]  Shortness of breath with exertion, [ ]  Atrial fibrillation or irregular heartbeat Vascular: [ ]  Pain in legs with walking, [ ]  Pain in legs at rest, [ ]  Pain in legs at night,  [ ]  Non-healing ulcer, [ ]  Blood clot in vein/DVT,   Pulmonary: [ ]  Home oxygen, [ ]  Productive cough, [ ]  Coughing up blood, [ ]  Asthma,  [ ]  Wheezing Musculoskeletal:  [ ]  Arthritis, [ ]  Low back pain, [ ]  Joint pain Hematologic: [ ]  Easy Bruising, [ ]  Anemia; [ ]  Hepatitis Gastrointestinal: [ ]  Blood in stool, [ ]  Gastroesophageal Reflux/heartburn, [ ]  Trouble swallowing Urinary: [ ]  chronic Kidney disease, [ ]  on HD - [ ]  MWF or [ ]  TTHS, [ ]  Burning with urination, [ ]  Difficulty urinating Skin: [ ]  Rashes, [ ]  Wounds Psychological: [ ]  Anxiety, [ ]  Depression   Social History History  Substance Use Topics  . Smoking status: Former Games developer  . Smokeless tobacco: Never Used  . Alcohol Use: No    Family History Family History  Problem Relation Age of Onset  . Cancer Father     Allergies  Allergen Reactions   . Penicillins Hives    Current Outpatient Prescriptions  Medication Sig Dispense Refill  . amphetamine-dextroamphetamine (ADDERALL) 10 MG tablet Take 10 mg by mouth daily.        Marland Kitchen b complex vitamins tablet Take 1 tablet by mouth daily.      . cholecalciferol (VITAMIN D) 1000 UNITS tablet Take 1,000 Units by mouth daily.      Marland Kitchen levothyroxine (SYNTHROID, LEVOTHROID) 75 MCG tablet Take 75 mcg by mouth daily.        . Multiple Vitamin (MULTIVITAMIN) tablet Take 1 tablet by mouth daily.        . rosuvastatin (CRESTOR) 10 MG tablet Take 10 mg by mouth daily.        Marland Kitchen anastrozole (ARIMIDEX) 1 MG tablet Take 1 mg by mouth daily.        . B Complex-C (B-COMPLEX WITH VITAMIN C) tablet Take 1 tablet by mouth daily.         Physical Examination  Filed Vitals:   09/23/12 1552  BP: 138/82  Pulse: 75  Resp:     Body mass index is 21.30 kg/(m^2).  General:  WDWN in NAD Gait: Normal HEENT: WNL Eyes: Pupils equal Pulmonary: normal non-labored breathing , without Rales, rhonchi,  wheezing Cardiac: RRR, without  Murmurs, rubs or gallops; Abdomen: soft, NT, no masses Skin: no rashes, ulcers noted  Vascular Exam Pulses: 3+ radial pulses bilaterally Carotid bruits: Carotid pulses to auscultation no bruits are heard Extremities without ischemic changes, no Gangrene , no cellulitis; no open wounds;  Musculoskeletal: no muscle wasting or atrophy   Neurologic: A&O X 3; Appropriate Affect ; SENSATION: normal; MOTOR FUNCTION:  moving all extremities equally. Speech is fluent/normal  Non-Invasive Vascular Imaging CAROTID DUPLEX 09/23/2012  Right ICA 20 - 39 % stenosis Left ICA 20 - 39 % stenosis   ASSESSMENT/PLAN: Asymptomatic patient with a slight downgrad in ICA stenosis per duplex. We will check patient again in 6 months just to make sure these numbers are correct. The patient's in agreement with this, her questions were encouraged and answered.  Lauree Chandler ANP Clinic MD: Myra Gianotti

## 2012-09-26 NOTE — Addendum Note (Signed)
Addended by: Sharee Pimple on: 09/26/2012 09:34 AM   Modules accepted: Orders

## 2012-12-18 ENCOUNTER — Telehealth: Payer: Self-pay | Admitting: Family Medicine

## 2012-12-18 DIAGNOSIS — F909 Attention-deficit hyperactivity disorder, unspecified type: Secondary | ICD-10-CM

## 2012-12-18 DIAGNOSIS — E039 Hypothyroidism, unspecified: Secondary | ICD-10-CM

## 2012-12-18 MED ORDER — AMPHETAMINE-DEXTROAMPHETAMINE 10 MG PO TABS: 10.0000 mg | ORAL_TABLET | Freq: Two times a day (BID) | ORAL | Status: DC

## 2012-12-18 MED ORDER — AMPHETAMINE-DEXTROAMPHETAMINE 10 MG PO TABS
10.0000 mg | ORAL_TABLET | Freq: Two times a day (BID) | ORAL | Status: DC
Start: 2012-12-18 — End: 2013-01-16

## 2012-12-18 NOTE — Telephone Encounter (Signed)
Needs written rx

## 2012-12-18 NOTE — Telephone Encounter (Signed)
rx is printed

## 2013-01-13 ENCOUNTER — Other Ambulatory Visit: Payer: Self-pay | Admitting: Family Medicine

## 2013-01-13 DIAGNOSIS — Z853 Personal history of malignant neoplasm of breast: Secondary | ICD-10-CM

## 2013-01-15 ENCOUNTER — Telehealth: Payer: Self-pay | Admitting: Family Medicine

## 2013-01-15 DIAGNOSIS — F909 Attention-deficit hyperactivity disorder, unspecified type: Secondary | ICD-10-CM

## 2013-01-15 NOTE — Telephone Encounter (Signed)
?   OK to Refill  

## 2013-01-16 MED ORDER — AMPHETAMINE-DEXTROAMPHETAMINE 10 MG PO TABS: 10.0000 mg | ORAL_TABLET | Freq: Two times a day (BID) | ORAL | Status: DC

## 2013-01-16 NOTE — Telephone Encounter (Signed)
Ok to pick up

## 2013-01-16 NOTE — Telephone Encounter (Signed)
Pt aware to pick up

## 2013-02-12 ENCOUNTER — Other Ambulatory Visit: Payer: Self-pay | Admitting: Family Medicine

## 2013-02-12 ENCOUNTER — Telehealth: Payer: Self-pay | Admitting: Family Medicine

## 2013-02-12 DIAGNOSIS — F909 Attention-deficit hyperactivity disorder, unspecified type: Secondary | ICD-10-CM

## 2013-02-12 MED ORDER — AMPHETAMINE-DEXTROAMPHETAMINE 10 MG PO TABS: 10.0000 mg | ORAL_TABLET | Freq: Two times a day (BID) | ORAL | Status: DC

## 2013-02-12 NOTE — Telephone Encounter (Signed)
Okay to pick up Rx. On my desk

## 2013-02-13 NOTE — Telephone Encounter (Signed)
Patient aware.

## 2013-02-17 ENCOUNTER — Ambulatory Visit (INDEPENDENT_AMBULATORY_CARE_PROVIDER_SITE_OTHER): Admitting: Family Medicine

## 2013-02-17 ENCOUNTER — Encounter: Payer: Self-pay | Admitting: Family Medicine

## 2013-02-17 VITALS — BP 128/72 | HR 74 | Temp 98.7°F | Resp 16 | Ht 63.5 in | Wt 134.0 lb

## 2013-02-17 DIAGNOSIS — J209 Acute bronchitis, unspecified: Secondary | ICD-10-CM

## 2013-02-17 DIAGNOSIS — F17200 Nicotine dependence, unspecified, uncomplicated: Secondary | ICD-10-CM

## 2013-02-17 HISTORY — DX: Nicotine dependence, unspecified, uncomplicated: F17.200

## 2013-02-17 MED ORDER — LEVOFLOXACIN 500 MG PO TABS: 500.0000 mg | ORAL_TABLET | Freq: Every day | ORAL | Status: DC

## 2013-02-17 MED ORDER — HYDROCODONE-HOMATROPINE 5-1.5 MG/5ML PO SYRP: 5.0000 mL | ORAL_SOLUTION | ORAL | Status: DC | PRN

## 2013-02-17 NOTE — Progress Notes (Signed)
  Subjective:    Patient ID: [name], female    DOB: 1955/12/29, 57 y.o.   MRN: [id number]  HPI Patient reports 2 weeks of illness. It began with a sore throat 2 weeks ago. It has since progressed with daily severe cough which is productive of yellow sputum. She also reports increased wheezing and shortness of breath. She reports mild pleurisy. She denies any fevers chills or riders. She denies any chest pain. She does have some chest pressure. She states it felt like she cholesterol. She denies any rhinorrhea, sore throat, nausea vomiting, diarrhea, or otalgia. Past Medical History  Diagnosis Date  . 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 ADHD (attention deficit hyperactivity disorder) 09/07/2011  . DVT (deep venous thrombosis) 09/07/2011  . Hypothyroid 09/07/2011  . Coronary artery disease   . Smoker 02/17/2013   Current Outpatient Prescriptions on File Prior to Visit  Medication Sig Dispense Refill  . amphetamine-dextroamphetamine (ADDERALL) 10 MG tablet Take 1 tablet (10 mg total) by mouth 2 (two) times daily.  60 tablet  0  . anastrozole (ARIMIDEX) 1 MG tablet Take 1 mg by mouth daily.        Marland Kitchen b complex vitamins tablet Take 1 tablet by mouth daily.      . B Complex-C (B-COMPLEX WITH VITAMIN C) tablet Take 1 tablet by mouth daily.       . cholecalciferol (VITAMIN D) 1000 UNITS tablet Take 1,000 Units by mouth daily.      Marland Kitchen levothyroxine (SYNTHROID, LEVOTHROID) 75 MCG tablet Take 75 mcg by mouth daily.        . Multiple Vitamin (MULTIVITAMIN) tablet Take 1 tablet by mouth daily.        . rosuvastatin (CRESTOR) 10 MG tablet Take 10 mg by mouth daily.         No current facility-administered medications on file prior to visit.   Allergies  Allergen Reactions  . Penicillins Hives      Review of Systems  All other systems reviewed and are negative.       Objective:   Physical Exam  Vitals reviewed. Constitutional: She appears well-developed and  well-nourished.  HENT:  Right Ear: External ear normal.  Left Ear: External ear normal.  Mouth/Throat: Oropharynx is clear and moist.  Eyes: Conjunctivae are normal. Pupils are equal, round, and reactive to light.  Neck: Neck supple. No JVD present. No tracheal deviation present. No thyromegaly present.  Cardiovascular: Normal rate, regular rhythm and normal heart sounds.  Exam reveals no gallop and no friction rub.   No murmur heard. Pulmonary/Chest: Effort normal and breath sounds normal. No respiratory distress. She has no wheezes. She has no rales.  Abdominal: Soft. Bowel sounds are normal.  Lymphadenopathy:    She has no cervical adenopathy.          Assessment & Plan:  1. Smoker Encourage smoking cessation.  2. Acute bronchitis Levaquin 500 mg by mouth daily for 7 days. Hycodan 5 mL by mouth every 4 hours when necessary cough. Mucinex DM one tablet by mouth every 12 hours when necessary cough.

## 2013-02-18 ENCOUNTER — Ambulatory Visit
Admission: RE | Admit: 2013-02-18 | Discharge: 2013-02-18 | Disposition: A | Discharge status: Home / Self Care | Source: Ambulatory Visit | Attending: Family Medicine | Admitting: Family Medicine

## 2013-02-18 DIAGNOSIS — Z853 Personal history of malignant neoplasm of breast: Secondary | ICD-10-CM

## 2013-02-24 ENCOUNTER — Other Ambulatory Visit: Payer: Self-pay | Admitting: Family Medicine

## 2013-02-25 NOTE — Telephone Encounter (Signed)
?   OK to Refill  

## 2013-02-25 NOTE — Telephone Encounter (Signed)
Do not refill.  She needs to call us if she is still sick.

## 2013-02-26 ENCOUNTER — Other Ambulatory Visit: Payer: Self-pay | Admitting: Family Medicine

## 2013-02-26 ENCOUNTER — Ambulatory Visit
Admission: RE | Admit: 2013-02-26 | Discharge: 2013-02-26 | Disposition: A | Discharge status: Home / Self Care | Source: Ambulatory Visit | Attending: Family Medicine | Admitting: Family Medicine

## 2013-02-26 DIAGNOSIS — F17200 Nicotine dependence, unspecified, uncomplicated: Secondary | ICD-10-CM

## 2013-02-26 DIAGNOSIS — J209 Acute bronchitis, unspecified: Secondary | ICD-10-CM

## 2013-02-27 ENCOUNTER — Other Ambulatory Visit: Payer: Self-pay | Admitting: Family Medicine

## 2013-02-27 MED ORDER — LEVOFLOXACIN 500 MG PO TABS: 500.0000 mg | ORAL_TABLET | Freq: Every day | ORAL | Status: DC

## 2013-02-27 NOTE — Telephone Encounter (Signed)
Rx Refilled  

## 2013-03-18 ENCOUNTER — Telehealth: Payer: Self-pay | Admitting: Family Medicine

## 2013-03-18 DIAGNOSIS — F909 Attention-deficit hyperactivity disorder, unspecified type: Secondary | ICD-10-CM

## 2013-03-18 MED ORDER — AMPHETAMINE-DEXTROAMPHETAMINE 10 MG PO TABS: 10.0000 mg | ORAL_TABLET | Freq: Two times a day (BID) | ORAL | Status: DC

## 2013-03-18 NOTE — Telephone Encounter (Signed)
Rx Refilled  

## 2013-03-18 NOTE — Telephone Encounter (Signed)
Ok to refill 

## 2013-03-18 NOTE — Telephone Encounter (Signed)
?   OK to Refill  

## 2013-03-20 ENCOUNTER — Other Ambulatory Visit (INDEPENDENT_AMBULATORY_CARE_PROVIDER_SITE_OTHER): Admitting: *Deleted

## 2013-03-20 DIAGNOSIS — I6529 Occlusion and stenosis of unspecified carotid artery: Secondary | ICD-10-CM

## 2013-03-24 ENCOUNTER — Other Ambulatory Visit

## 2013-03-24 ENCOUNTER — Ambulatory Visit: Admitting: Neurosurgery

## 2013-03-26 ENCOUNTER — Encounter: Payer: Self-pay | Admitting: Vascular Surgery

## 2013-03-26 ENCOUNTER — Other Ambulatory Visit: Payer: Self-pay

## 2013-04-16 ENCOUNTER — Telehealth: Payer: Self-pay | Admitting: Family Medicine

## 2013-04-16 DIAGNOSIS — F909 Attention-deficit hyperactivity disorder, unspecified type: Secondary | ICD-10-CM

## 2013-04-16 NOTE — Telephone Encounter (Signed)
?   OK to Refill  

## 2013-04-17 MED ORDER — AMPHETAMINE-DEXTROAMPHETAMINE 10 MG PO TABS: 10.0000 mg | ORAL_TABLET | Freq: Two times a day (BID) | ORAL | Status: DC

## 2013-04-17 NOTE — Telephone Encounter (Signed)
Ok to refill 

## 2013-04-17 NOTE — Telephone Encounter (Signed)
Med refilled, pt aware to pick up

## 2013-05-15 ENCOUNTER — Telehealth: Payer: Self-pay | Admitting: Family Medicine

## 2013-05-15 DIAGNOSIS — F909 Attention-deficit hyperactivity disorder, unspecified type: Secondary | ICD-10-CM

## 2013-05-15 MED ORDER — AMPHETAMINE-DEXTROAMPHETAMINE 10 MG PO TABS: 10.0000 mg | ORAL_TABLET | Freq: Two times a day (BID) | ORAL | Status: DC

## 2013-05-15 NOTE — Telephone Encounter (Signed)
CPE 09/2012.  Had sick visit 01/2013.  Last rf 04/17/13  OK refill?

## 2013-05-15 NOTE — Telephone Encounter (Signed)
Ok to refill 1 month, due for ov.

## 2013-05-15 NOTE — Telephone Encounter (Signed)
Rx printed for provider signature.  Left pt mess.  Need to be told will need office visit before next refill.

## 2013-05-15 NOTE — Telephone Encounter (Signed)
Adderall 10 mg 1 BID #60

## 2013-06-02 HISTORY — PX: EYE SURGERY: SHX253

## 2013-06-03 ENCOUNTER — Encounter: Payer: Self-pay | Admitting: Family Medicine

## 2013-06-03 ENCOUNTER — Ambulatory Visit (INDEPENDENT_AMBULATORY_CARE_PROVIDER_SITE_OTHER): Admitting: Family Medicine

## 2013-06-03 VITALS — BP 110/74 | HR 80 | Temp 99.3°F | Resp 18 | Ht 64.0 in | Wt 134.0 lb

## 2013-06-03 DIAGNOSIS — E039 Hypothyroidism, unspecified: Secondary | ICD-10-CM

## 2013-06-03 DIAGNOSIS — I6529 Occlusion and stenosis of unspecified carotid artery: Secondary | ICD-10-CM

## 2013-06-03 DIAGNOSIS — I658 Occlusion and stenosis of other precerebral arteries: Secondary | ICD-10-CM

## 2013-06-03 DIAGNOSIS — F909 Attention-deficit hyperactivity disorder, unspecified type: Secondary | ICD-10-CM

## 2013-06-03 DIAGNOSIS — I6523 Occlusion and stenosis of bilateral carotid arteries: Secondary | ICD-10-CM

## 2013-06-03 NOTE — Progress Notes (Signed)
Subjective:    Patient ID: [name], female    DOB: [birth year], 57 y.o.   MRN: [id number]  HPI Patient is here for followup of her ADHD. She is currently taking Adderall 10 mg by mouth twice a day. She finds that this helps her maintain her focus at work. It helps her finish tasks and stay on track at work. Without the medication she has a difficult time listening and following directions. She has difficult time completing projects.  She has no issues with hyperactivity. She has no problems with anxiety on the medication. She has no problems with weight loss on the medication. She says a history of hyperlipidemia. She is going on Crestor 10 mg by mouth daily. This is done because of bilateral carotid artery stenosis seen on carotid Dopplers. Her goal LDL is less than 70. She is overdue check a fasting lipid panel. She also has hypothyroidism. She is currently on Synthroid 75 mcg by mouth daily. She denies any fatigue or recent changes in her weight. She denies any depression or anxiety. Past Medical History  Diagnosis Date  . 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 ADHD (attention deficit hyperactivity disorder) 09/07/2011  . DVT (deep venous thrombosis) 09/07/2011  . Hypothyroid 09/07/2011  . Coronary artery disease   . Smoker 02/17/2013   Past Surgical History  Procedure Laterality Date  . Breast lumpectomy  March 2013    Left breast   Current Outpatient Prescriptions on File Prior to Visit  Medication Sig Dispense Refill  . amphetamine-dextroamphetamine (ADDERALL) 10 MG tablet Take 1 tablet (10 mg total) by mouth 2 (two) times daily.  60 tablet  0  . b complex vitamins tablet Take 1 tablet by mouth daily.      . B Complex-C (B-COMPLEX WITH VITAMIN C) tablet Take 1 tablet by mouth daily.       . cholecalciferol (VITAMIN D) 1000 UNITS tablet Take 1,000 Units by mouth daily.      Marland Kitchen levothyroxine (SYNTHROID, LEVOTHROID) 75 MCG tablet Take 75 mcg by mouth daily.        .  Multiple Vitamin (MULTIVITAMIN) tablet Take 1 tablet by mouth daily.        . rosuvastatin (CRESTOR) 10 MG tablet Take 10 mg by mouth daily.        Marland Kitchen anastrozole (ARIMIDEX) 1 MG tablet Take 1 mg by mouth daily.        Marland Kitchen HYDROcodone-homatropine (HYCODAN) 5-1.5 MG/5ML syrup Take 5 mLs by mouth every 4 (four) hours as needed for cough.  120 mL  0  . levofloxacin (LEVAQUIN) 500 MG tablet Take 1 tablet (500 mg total) by mouth daily.  7 tablet  0   No current facility-administered medications on file prior to visit.   Allergies  Allergen Reactions  . Penicillins Hives   History   Social History  . Marital Status: Divorced    Spouse Name: N/A    Number of Children: N/A  . Years of Education: N/A   Occupational History  . Not on file.   Social History Main Topics  . Smoking status: Former Games developer  . Smokeless tobacco: Never Used  . Alcohol Use: No  . Drug Use: No  . Sexual Activity: No   Other Topics Concern  . Not on file   Social History Narrative  . No narrative on file   Family History  Problem Relation Age of Onset  . Cancer Father      Review  of Systems  All other systems reviewed and are negative.       Objective:   Physical Exam  Constitutional: She is oriented to person, place, and time. She appears well-developed and well-nourished.  Neck: Neck supple. No JVD present. No thyromegaly present.  Cardiovascular: Normal rate, regular rhythm and intact distal pulses.   Murmur: 1/6 systolic ejection murmur heard best at the left upper sternal border. Pulmonary/Chest: Effort normal and breath sounds normal. No respiratory distress. She has no wheezes. She has no rales. She exhibits no tenderness.  Abdominal: Soft. Bowel sounds are normal. She exhibits no distension. There is no tenderness. There is no rebound and no guarding.  Musculoskeletal: Normal range of motion. She exhibits no edema and no tenderness.  Lymphadenopathy:    She has no cervical adenopathy.   Neurological: She is alert and oriented to person, place, and time. She has normal reflexes. She displays normal reflexes. No cranial nerve deficit. She exhibits normal muscle tone. Coordination normal.  Psychiatric: She has a normal mood and affect. Her behavior is normal. Judgment and thought content normal.          Assessment & Plan:  1. ADHD (attention deficit hyperactivity disorder) Currently well controlled continue Adderall 10 mg by mouth twice a day 60 tablets a month. 2. Hypothyroid Return for TSH. Titrate levothyroxine and TSH within therapeutic range. - TSH; Future  3. Carotid stenosis, asymptomatic, bilateral Fasting for a fasting lipid panel. Goal LDL is less than 70. Titrate Crestor to achieve a goal LDL less than 70.. - COMPLETE METABOLIC PANEL WITH GFR; Future - Lipid panel; Future

## 2013-06-17 ENCOUNTER — Telehealth: Payer: Self-pay | Admitting: Family Medicine

## 2013-06-17 DIAGNOSIS — F909 Attention-deficit hyperactivity disorder, unspecified type: Secondary | ICD-10-CM

## 2013-06-17 MED ORDER — AMPHETAMINE-DEXTROAMPHETAMINE 10 MG PO TABS: 10.0000 mg | ORAL_TABLET | Freq: Two times a day (BID) | ORAL | Status: DC

## 2013-06-17 NOTE — Telephone Encounter (Signed)
RX printed, left up front and patient aware to pick up  

## 2013-06-17 NOTE — Telephone Encounter (Signed)
ok 

## 2013-06-17 NOTE — Telephone Encounter (Signed)
?   OK to Refill  

## 2013-07-03 ENCOUNTER — Other Ambulatory Visit: Admitting: Lab

## 2013-07-03 ENCOUNTER — Ambulatory Visit: Admitting: Oncology

## 2013-07-07 ENCOUNTER — Telehealth: Payer: Self-pay | Admitting: Family Medicine

## 2013-07-07 DIAGNOSIS — F909 Attention-deficit hyperactivity disorder, unspecified type: Secondary | ICD-10-CM

## 2013-07-07 NOTE — Telephone Encounter (Signed)
?   OK to Refill  

## 2013-07-07 NOTE — Telephone Encounter (Signed)
ok 

## 2013-07-09 MED ORDER — AMPHETAMINE-DEXTROAMPHETAMINE 10 MG PO TABS: 10.0000 mg | ORAL_TABLET | Freq: Two times a day (BID) | ORAL | Status: DC

## 2013-07-09 NOTE — Telephone Encounter (Signed)
Pt is going out of town and would like rx for her Adderall to take with her. Per [facility]P ok to do. Rx printed signed and left up front.

## 2013-07-09 NOTE — Telephone Encounter (Signed)
Patient is calling in regards to her Rx. Wants to know if she can come by and pick it up . Tomorrow.

## 2013-07-17 ENCOUNTER — Telehealth: Payer: Self-pay | Admitting: Oncology

## 2013-08-01 ENCOUNTER — Other Ambulatory Visit

## 2013-08-01 ENCOUNTER — Other Ambulatory Visit: Admitting: Lab

## 2013-08-01 ENCOUNTER — Ambulatory Visit (HOSPITAL_BASED_OUTPATIENT_CLINIC_OR_DEPARTMENT_OTHER): Admitting: Oncology

## 2013-08-01 ENCOUNTER — Ambulatory Visit: Admitting: Oncology

## 2013-08-01 ENCOUNTER — Other Ambulatory Visit (HOSPITAL_BASED_OUTPATIENT_CLINIC_OR_DEPARTMENT_OTHER): Admitting: Lab

## 2013-08-01 ENCOUNTER — Encounter: Payer: Self-pay | Admitting: Oncology

## 2013-08-01 ENCOUNTER — Telehealth: Payer: Self-pay | Admitting: Oncology

## 2013-08-01 VITALS — BP 147/86 | HR 82 | Temp 98.9°F | Resp 20 | Ht 64.0 in | Wt 135.7 lb

## 2013-08-01 DIAGNOSIS — I6523 Occlusion and stenosis of bilateral carotid arteries: Secondary | ICD-10-CM

## 2013-08-01 DIAGNOSIS — D0590 Unspecified type of carcinoma in situ of unspecified breast: Secondary | ICD-10-CM

## 2013-08-01 DIAGNOSIS — F17200 Nicotine dependence, unspecified, uncomplicated: Secondary | ICD-10-CM

## 2013-08-01 DIAGNOSIS — E039 Hypothyroidism, unspecified: Secondary | ICD-10-CM

## 2013-08-01 DIAGNOSIS — D0510 Intraductal carcinoma in situ of unspecified breast: Secondary | ICD-10-CM

## 2013-08-01 LAB — COMPLETE METABOLIC PANEL WITH GFR
ALT: 19 U/L (ref 0–35)
AST: 21 U/L (ref 0–37)
Albumin: 4.2 g/dL (ref 3.5–5.2)
Alkaline Phosphatase: 82 U/L (ref 39–117)
BUN: 12 mg/dL (ref 6–23)
CO2: 26 mEq/L (ref 19–32)
Calcium: 9.9 mg/dL (ref 8.4–10.5)
Chloride: 101 mEq/L (ref 96–112)
Creat: 0.64 mg/dL (ref 0.50–1.10)
GFR, Est African American: >89 mL/min
GFR, Est Non African American: >89 mL/min
Glucose, Bld: 91 mg/dL (ref 70–99)
Potassium: 5.9 mEq/L — ABNORMAL HIGH (ref 3.5–5.3)
Sodium: 134 mEq/L — ABNORMAL LOW (ref 135–145)
Total Bilirubin: 0.4 mg/dL (ref 0.3–1.2)
Total Protein: 6.6 g/dL (ref 6.0–8.3)

## 2013-08-01 LAB — LIPID PANEL
Cholesterol: 155 mg/dL (ref 0–200)
HDL: 80 mg/dL (ref >39)
LDL Cholesterol: 58 mg/dL (ref 0–99)
Total CHOL/HDL Ratio: 1.9 Ratio
Triglycerides: 85 mg/dL (ref <150)
VLDL: 17 mg/dL (ref 0–40)

## 2013-08-01 LAB — CBC WITH DIFFERENTIAL/PLATELET
BASO%: 1 % (ref 0.0–2.0)
Basophils Absolute: 0.1 10*3/uL (ref 0.0–0.1)
EOS%: 3.1 % (ref 0.0–7.0)
Eosinophils Absolute: 0.3 10*3/uL (ref 0.0–0.5)
HCT: 38.4 % (ref 34.8–46.6)
HGB: 12.8 g/dL (ref 11.6–15.9)
LYMPH%: 24.9 % (ref 14.0–49.7)
MCH: 31.7 pg (ref 25.1–34.0)
MCHC: 33.4 g/dL (ref 31.5–36.0)
MCV: 94.8 fL (ref 79.5–101.0)
MONO#: 0.7 10*3/uL (ref 0.1–0.9)
MONO%: 6.1 % (ref 0.0–14.0)
NEUT#: 6.9 10*3/uL — ABNORMAL HIGH (ref 1.5–6.5)
NEUT%: 64.9 % (ref 38.4–76.8)
Platelets: 452 10*3/uL — ABNORMAL HIGH (ref 145–400)
RBC: 4.05 10*6/uL (ref 3.70–5.45)
RDW: 13.7 % (ref 11.2–14.5)
WBC: 10.7 10*3/uL — ABNORMAL HIGH (ref 3.9–10.3)
lymph#: 2.7 10*3/uL (ref 0.9–3.3)

## 2013-08-01 LAB — TSH: TSH: 1.323 u[IU]/mL (ref 0.350–4.500)

## 2013-08-01 NOTE — Progress Notes (Signed)
OFFICE PROGRESS NOTE  CC Dr. Manus Rudd Dr. 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 She had an ultrasound that showed a small mass measuring 5 x 9 mm at about the 3:30 position. She subsequently had a biopsy performed that showed a ductal carcinoma in situ. The tumor was ER +54% PR +74%.  #2 she then had a lumpectomy on 01/30/2007. The final pathology on the lumpectomy specimen showed a high-grade ductal carcinoma in situ with focal necrosis without evidence of invasive cancer. The DCIS component measured 1.7 cm.  #3 she then underwent radiation therapy under the care of Dr. Dorna Bloom she received the radiation from 03/26/2007 2 05/13/2007  #4 she then was started on adjuvant Arimidex 1 mg daily in September 2008  CURRENT THERAPY: Arimidex 1 mg daily  INTERVAL HISTORY: [name] 57 y.o. female returns for followup visit today. Patient has been off of Arimidex for one year now. Overall she feels well. Unfortunately she still continues to smoke. We discussed smoking cessation again. She denies any nausea vomiting fevers chills night sweats no headaches no shortness of breath or chest pains. She is gong to get a flu shot from her primary care physician's office. Remainder of the 10 point review of systems is negative. MEDICAL HISTORY: Past Medical History  Diagnosis Date  . 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 ADHD (attention deficit hyperactivity disorder) 09/07/2011  . DVT (deep venous thrombosis) 09/07/2011  . Hypothyroid 09/07/2011  . Coronary artery disease   . Smoker 02/17/2013    ALLERGIES:  is allergic to penicillins.  MEDICATIONS:  Current Outpatient Prescriptions  Medication Sig Dispense Refill   . amphetamine-dextroamphetamine (ADDERALL) 10 MG tablet Take 1 tablet (10 mg total) by mouth 2 (two) times daily.  60 tablet  0  . B Complex-C (B-COMPLEX WITH VITAMIN C) tablet Take 1 tablet by mouth daily.       . cholecalciferol (VITAMIN D) 1000 UNITS tablet Take 1,000 Units by mouth daily.      Marland Kitchen levothyroxine (SYNTHROID, LEVOTHROID) 75 MCG tablet Take 75 mcg by mouth daily.        . Multiple Vitamin (MULTIVITAMIN) tablet Take 1 tablet by mouth daily.        . rosuvastatin (CRESTOR) 10 MG tablet Take 10 mg by mouth daily.         No current facility-administered medications for this visit.    SURGICAL HISTORY:  Past Surgical History  Procedure Laterality Date  . Breast lumpectomy  March 2013    Left breast    REVIEW OF SYSTEMS:  Pertinent items are noted in HPI.   PHYSICAL EXAMINATION: General appearance: alert, cooperative and appears stated age Head: Normocephalic, without obvious abnormality, atraumatic Neck: no adenopathy, no carotid bruit, no JVD, supple, symmetrical, trachea midline and thyroid not enlarged, symmetric, no tenderness/mass/nodules Lymph nodes: Cervical, supraclavicular, and axillary nodes normal. Resp: clear to auscultation bilaterally and normal percussion bilaterally Back: symmetric, no curvature. ROM normal. No CVA tenderness. Cardio: regular rate and rhythm, S1, S2 normal, no murmur, click, rub or gallop and normal apical impulse GI: soft, non-tender; bowel sounds normal; no masses,  no organomegaly Extremities: extremities normal, atraumatic, no cyanosis or edema Neurologic: Alert and oriented X 3, normal strength  and tone. Normal symmetric reflexes. Normal coordination and gait Bilateral breasts are examined right breast no masses nipple discharge or skin changes left breast revealed well-healed surgical scar in the outer lower quadrant there is no nipple discharge no dominant masses ECOG PERFORMANCE STATUS: 1 - Symptomatic but completely  ambulatory  Blood pressure 147/86, pulse 82, temperature 98.9 F (37.2 C), temperature source Oral, resp. rate 20, height 5\' 4"  (1.626 m), weight 135 lb 11.2 oz (61.553 kg).  LABORATORY DATA: Lab Results  Component Value Date   WBC 10.7* 08/01/2013   HGB 12.8 08/01/2013   HCT 38.4 08/01/2013   MCV 94.8 08/01/2013   PLT 452* 08/01/2013      Chemistry      Component Value Date/Time   NA 138 07/03/2012 1416   NA 138 03/07/2012 1206   K 4.1 07/03/2012 1416   K 4.5 03/07/2012 1206   CL 105 07/03/2012 1416   CL 106 03/07/2012 1206   CO2 21* 07/03/2012 1416   CO2 23 03/07/2012 1206   BUN 16.0 07/03/2012 1416   BUN 11 03/07/2012 1206   CREATININE 0.8 07/03/2012 1416   CREATININE 0.64 03/07/2012 1206      Component Value Date/Time   CALCIUM 9.5 07/03/2012 1416   CALCIUM 9.4 03/07/2012 1206   ALKPHOS 95 07/03/2012 1416   ALKPHOS 81 03/07/2012 1206   AST 22 07/03/2012 1416   AST 24 03/07/2012 1206   ALT 22 07/03/2012 1416   ALT 22 03/07/2012 1206   BILITOT 0.30 07/03/2012 1416   BILITOT 0.3 03/07/2012 1206       RADIOGRAPHIC STUDIES:  No results found.  ASSESSMENT: 57 year old female with high-grade DCIS measuring 1.7 cm of the left breast. Patient is status post lumpectomy followed by radiation therapy for ER-positive PR-positive DCIS. She overall is tolerating everything quite well. She remains on Arimidex 1 mg daily since September 2008. Patient has now completed 5 years of Arimidex. She will not come off of this. We discussed survivorship. We also discussed the importance of continuing to exercise eating healthy self breast examination and continuing to get diagnostic mammograms.  PLAN:   #1 patient will be seen back in one years time for followup  #2 I have encouraged her to continue to see her primary care physician as well for ongoing general health. We discussed exercise eating healthy. Literature and survivorship was given to her as well.   All questions were answered. The patient knows to  call the clinic with any problems, questions or concerns. We can certainly see the patient much sooner if necessary.  I spent 10 minutes counseling the patient face to face. The total time spent in the appointment was 15 minutes.    Drue Second, MD Medical/Oncology [facility] [phone] (beeper) [phone] (Office)  08/01/2013, 3:31 PM

## 2013-08-01 NOTE — Telephone Encounter (Signed)
s.w. pt daughter and advised on OCT 2015 appt....pt ok and aware

## 2013-08-11 ENCOUNTER — Other Ambulatory Visit: Payer: Self-pay | Admitting: Family Medicine

## 2013-08-11 DIAGNOSIS — F909 Attention-deficit hyperactivity disorder, unspecified type: Secondary | ICD-10-CM

## 2013-08-11 NOTE — Telephone Encounter (Signed)
Ok to refill 

## 2013-08-11 NOTE — Telephone Encounter (Signed)
ok 

## 2013-08-11 NOTE — Telephone Encounter (Signed)
Patient needs her adderall refilled . °

## 2013-08-12 MED ORDER — AMPHETAMINE-DEXTROAMPHETAMINE 10 MG PO TABS: 10.0000 mg | ORAL_TABLET | Freq: Two times a day (BID) | ORAL | Status: DC

## 2013-08-12 NOTE — Telephone Encounter (Signed)
Script printed out, ready for provider signature, pt to pick up

## 2013-08-14 ENCOUNTER — Other Ambulatory Visit: Payer: Self-pay | Admitting: Family Medicine

## 2013-08-14 ENCOUNTER — Telehealth: Payer: Self-pay | Admitting: Family Medicine

## 2013-08-14 DIAGNOSIS — F909 Attention-deficit hyperactivity disorder, unspecified type: Secondary | ICD-10-CM

## 2013-08-14 MED ORDER — AMPHETAMINE-DEXTROAMPHETAMINE 10 MG PO TABS: 10.0000 mg | ORAL_TABLET | Freq: Two times a day (BID) | ORAL | Status: DC

## 2013-08-14 NOTE — Telephone Encounter (Signed)
RX printed, left up front and patient aware to pick up  

## 2013-08-25 ENCOUNTER — Other Ambulatory Visit

## 2013-08-25 DIAGNOSIS — E875 Hyperkalemia: Secondary | ICD-10-CM

## 2013-08-26 LAB — BASIC METABOLIC PANEL
BUN: 11 mg/dL (ref 6–23)
CO2: 23 mEq/L (ref 19–32)
Calcium: 9.7 mg/dL (ref 8.4–10.5)
Chloride: 103 mEq/L (ref 96–112)
Creat: 0.72 mg/dL (ref 0.50–1.10)
Glucose, Bld: 95 mg/dL (ref 70–99)
Potassium: 4.6 mEq/L (ref 3.5–5.3)
Sodium: 137 mEq/L (ref 135–145)

## 2013-08-29 ENCOUNTER — Encounter: Payer: Self-pay | Admitting: Family Medicine

## 2013-09-10 NOTE — Telephone Encounter (Signed)
.?   OK to Refill and ok to do 3 months 

## 2013-09-11 MED ORDER — AMPHETAMINE-DEXTROAMPHETAMINE 10 MG PO TABS: 10.0000 mg | ORAL_TABLET | Freq: Two times a day (BID) | ORAL | Status: DC

## 2013-09-11 NOTE — Telephone Encounter (Signed)
ok 

## 2013-09-11 NOTE — Telephone Encounter (Signed)
RX printed, left up front and patient aware to pick up per VM

## 2013-09-16 ENCOUNTER — Other Ambulatory Visit: Payer: Self-pay | Admitting: Family Medicine

## 2013-09-16 MED ORDER — NAPROXEN 375 MG PO TABS: 375.0000 mg | ORAL_TABLET | Freq: Two times a day (BID) | ORAL | Status: DC

## 2013-09-16 NOTE — Telephone Encounter (Signed)
Rx Refilled  

## 2013-09-22 ENCOUNTER — Other Ambulatory Visit: Payer: Self-pay | Admitting: Family Medicine

## 2013-09-22 DIAGNOSIS — F909 Attention-deficit hyperactivity disorder, unspecified type: Secondary | ICD-10-CM

## 2013-09-22 DIAGNOSIS — E039 Hypothyroidism, unspecified: Secondary | ICD-10-CM

## 2013-09-22 MED ORDER — LEVOTHYROXINE SODIUM 75 MCG PO TABS: 75.0000 ug | ORAL_TABLET | Freq: Every day | ORAL | Status: DC

## 2013-09-22 NOTE — Telephone Encounter (Signed)
Rx Refilled  

## 2013-10-14 ENCOUNTER — Telehealth: Payer: Self-pay | Admitting: Family Medicine

## 2013-10-14 DIAGNOSIS — E039 Hypothyroidism, unspecified: Secondary | ICD-10-CM

## 2013-10-14 DIAGNOSIS — D0510 Intraductal carcinoma in situ of unspecified breast: Secondary | ICD-10-CM

## 2013-10-14 DIAGNOSIS — I82409 Acute embolism and thrombosis of unspecified deep veins of unspecified lower extremity: Secondary | ICD-10-CM

## 2013-10-14 DIAGNOSIS — F909 Attention-deficit hyperactivity disorder, unspecified type: Secondary | ICD-10-CM

## 2013-10-14 MED ORDER — ROSUVASTATIN CALCIUM 10 MG PO TABS: 10.0000 mg | ORAL_TABLET | Freq: Every day | ORAL | Status: DC

## 2013-10-14 NOTE — Telephone Encounter (Signed)
Medication refilled per protocol. 

## 2013-10-15 ENCOUNTER — Other Ambulatory Visit: Payer: Self-pay | Admitting: Family Medicine

## 2013-10-15 DIAGNOSIS — D0510 Intraductal carcinoma in situ of unspecified breast: Secondary | ICD-10-CM

## 2013-10-15 DIAGNOSIS — F909 Attention-deficit hyperactivity disorder, unspecified type: Secondary | ICD-10-CM

## 2013-10-15 DIAGNOSIS — I82409 Acute embolism and thrombosis of unspecified deep veins of unspecified lower extremity: Secondary | ICD-10-CM

## 2013-10-15 DIAGNOSIS — E039 Hypothyroidism, unspecified: Secondary | ICD-10-CM

## 2013-10-15 MED ORDER — ROSUVASTATIN CALCIUM 10 MG PO TABS: 10.0000 mg | ORAL_TABLET | Freq: Every day | ORAL | Status: DC

## 2013-10-31 ENCOUNTER — Encounter: Payer: Self-pay | Admitting: Family Medicine

## 2013-10-31 NOTE — Telephone Encounter (Signed)
This encounter was created in error - please disregard.

## 2013-11-09 ENCOUNTER — Other Ambulatory Visit: Payer: Self-pay | Admitting: Family Medicine

## 2013-11-14 ENCOUNTER — Other Ambulatory Visit: Payer: Self-pay | Admitting: Surgery

## 2013-11-14 DIAGNOSIS — I6529 Occlusion and stenosis of unspecified carotid artery: Secondary | ICD-10-CM

## 2013-12-03 ENCOUNTER — Telehealth: Payer: Self-pay | Admitting: Family Medicine

## 2013-12-03 DIAGNOSIS — F909 Attention-deficit hyperactivity disorder, unspecified type: Secondary | ICD-10-CM

## 2013-12-03 NOTE — Telephone Encounter (Signed)
Call back number is [phone] Pt is needing a refill on amphetamine-dextroamphetamine (ADDERALL) 10 MG tablet

## 2013-12-03 NOTE — Telephone Encounter (Signed)
?   OK to Refill  

## 2013-12-04 NOTE — Telephone Encounter (Signed)
ok 

## 2013-12-05 MED ORDER — AMPHETAMINE-DEXTROAMPHETAMINE 10 MG PO TABS: 10.0000 mg | ORAL_TABLET | Freq: Two times a day (BID) | ORAL | Status: DC

## 2013-12-05 NOTE — Telephone Encounter (Signed)
RX printed, left up front and patient aware to pick up  

## 2014-01-05 ENCOUNTER — Telehealth: Payer: Self-pay | Admitting: Family Medicine

## 2014-01-05 DIAGNOSIS — F909 Attention-deficit hyperactivity disorder, unspecified type: Secondary | ICD-10-CM

## 2014-01-05 NOTE — Telephone Encounter (Signed)
Call back number is [phone] Pt is needing a refill on amphetamine-dextroamphetamine (ADDERALL) 10 MG tablet

## 2014-01-05 NOTE — Telephone Encounter (Signed)
?   OK to Refill  

## 2014-01-06 MED ORDER — AMPHETAMINE-DEXTROAMPHETAMINE 10 MG PO TABS: 10.0000 mg | ORAL_TABLET | Freq: Two times a day (BID) | ORAL | Status: DC

## 2014-01-06 NOTE — Telephone Encounter (Signed)
RX printed, left up front and patient aware to pick up  

## 2014-01-06 NOTE — Telephone Encounter (Signed)
ok 

## 2014-01-15 ENCOUNTER — Other Ambulatory Visit: Payer: Self-pay | Admitting: Oncology

## 2014-01-15 DIAGNOSIS — Z853 Personal history of malignant neoplasm of breast: Secondary | ICD-10-CM

## 2014-01-30 ENCOUNTER — Telehealth: Payer: Self-pay | Admitting: Family Medicine

## 2014-01-30 DIAGNOSIS — F909 Attention-deficit hyperactivity disorder, unspecified type: Secondary | ICD-10-CM

## 2014-01-30 NOTE — Telephone Encounter (Signed)
[phone] patient would like refill on her adderall if possible

## 2014-01-30 NOTE — Telephone Encounter (Signed)
?   OK to Refill  

## 2014-02-02 NOTE — Telephone Encounter (Signed)
ok 

## 2014-02-03 MED ORDER — AMPHETAMINE-DEXTROAMPHETAMINE 10 MG PO TABS: 10.0000 mg | ORAL_TABLET | Freq: Two times a day (BID) | ORAL | Status: DC

## 2014-02-03 NOTE — Telephone Encounter (Signed)
RX printed, left up front and patient aware to pick up  

## 2014-02-18 ENCOUNTER — Other Ambulatory Visit: Payer: Self-pay | Admitting: Adult Health

## 2014-02-18 DIAGNOSIS — Z853 Personal history of malignant neoplasm of breast: Secondary | ICD-10-CM

## 2014-02-20 ENCOUNTER — Encounter

## 2014-02-24 ENCOUNTER — Encounter

## 2014-02-24 ENCOUNTER — Ambulatory Visit
Admission: RE | Admit: 2014-02-24 | Discharge: 2014-02-24 | Disposition: A | Discharge status: Home / Self Care | Source: Ambulatory Visit | Attending: Oncology | Admitting: Oncology

## 2014-02-24 DIAGNOSIS — Z853 Personal history of malignant neoplasm of breast: Secondary | ICD-10-CM

## 2014-03-02 ENCOUNTER — Telehealth: Payer: Self-pay | Admitting: Family Medicine

## 2014-03-02 DIAGNOSIS — F909 Attention-deficit hyperactivity disorder, unspecified type: Secondary | ICD-10-CM

## 2014-03-02 MED ORDER — AMPHETAMINE-DEXTROAMPHETAMINE 10 MG PO TABS: 10.0000 mg | ORAL_TABLET | Freq: Two times a day (BID) | ORAL | Status: DC

## 2014-03-02 NOTE — Telephone Encounter (Signed)
[phone] °Pt is needing a refill on amphetamine-dextroamphetamine (ADDERALL) 10 MG tablet °

## 2014-03-02 NOTE — Telephone Encounter (Signed)
RX printed, left up front and patient aware to pick up  

## 2014-03-02 NOTE — Telephone Encounter (Signed)
Ok if 1 month.

## 2014-03-02 NOTE — Telephone Encounter (Signed)
?   OK to Refill  

## 2014-03-16 ENCOUNTER — Other Ambulatory Visit: Payer: Self-pay | Admitting: Family Medicine

## 2014-03-27 ENCOUNTER — Encounter: Payer: Self-pay | Admitting: Family

## 2014-03-30 ENCOUNTER — Other Ambulatory Visit (HOSPITAL_COMMUNITY)

## 2014-03-30 ENCOUNTER — Ambulatory Visit: Admitting: Surgery

## 2014-03-30 ENCOUNTER — Ambulatory Visit: Admitting: Family

## 2014-03-30 ENCOUNTER — Inpatient Hospital Stay (HOSPITAL_COMMUNITY): Admission: RE | Admit: 2014-03-30 | Source: Ambulatory Visit

## 2014-03-30 ENCOUNTER — Telehealth: Payer: Self-pay | Admitting: Family Medicine

## 2014-03-30 DIAGNOSIS — F909 Attention-deficit hyperactivity disorder, unspecified type: Secondary | ICD-10-CM

## 2014-03-30 MED ORDER — AMPHETAMINE-DEXTROAMPHETAMINE 10 MG PO TABS: 10.0000 mg | ORAL_TABLET | Freq: Two times a day (BID) | ORAL | Status: DC

## 2014-03-30 NOTE — Telephone Encounter (Signed)
ok 

## 2014-03-30 NOTE — Telephone Encounter (Signed)
?   OK to Refill  

## 2014-03-30 NOTE — Telephone Encounter (Signed)
Prescription printed and patient made aware to come to office to pick up.  

## 2014-03-30 NOTE — Telephone Encounter (Signed)
[phone] Patient is calling to get refill on her adderall

## 2014-04-07 ENCOUNTER — Other Ambulatory Visit: Payer: Self-pay | Admitting: Family Medicine

## 2014-04-24 ENCOUNTER — Telehealth: Payer: Self-pay | Admitting: Family Medicine

## 2014-04-24 NOTE — Telephone Encounter (Signed)
Patient is requesting a refill on adderall  Phone number to call when ready is [phone]

## 2014-04-28 ENCOUNTER — Telehealth: Payer: Self-pay | Admitting: Family Medicine

## 2014-04-28 DIAGNOSIS — F909 Attention-deficit hyperactivity disorder, unspecified type: Secondary | ICD-10-CM

## 2014-04-28 MED ORDER — AMPHETAMINE-DEXTROAMPHETAMINE 10 MG PO TABS: 10.0000 mg | ORAL_TABLET | Freq: Two times a day (BID) | ORAL | Status: DC

## 2014-04-28 NOTE — Telephone Encounter (Signed)
RX printed, left up front and patient aware to pick up per vm 

## 2014-04-28 NOTE — Telephone Encounter (Signed)
Patient is calling to get refill on her adderall if possible please call [phone] when finished

## 2014-04-28 NOTE — Telephone Encounter (Signed)
?   OK to Refill  

## 2014-04-28 NOTE — Telephone Encounter (Signed)
ok 

## 2014-05-25 ENCOUNTER — Telehealth: Payer: Self-pay | Admitting: Family Medicine

## 2014-05-25 DIAGNOSIS — F909 Attention-deficit hyperactivity disorder, unspecified type: Secondary | ICD-10-CM

## 2014-05-25 NOTE — Telephone Encounter (Signed)
?   OK to Refill  

## 2014-05-25 NOTE — Telephone Encounter (Signed)
ok 

## 2014-05-25 NOTE — Telephone Encounter (Signed)
[phone] °Pt is needing a refill on amphetamine-dextroamphetamine (ADDERALL) 10 MG tablet °

## 2014-05-26 MED ORDER — AMPHETAMINE-DEXTROAMPHETAMINE 10 MG PO TABS: 10.0000 mg | ORAL_TABLET | Freq: Two times a day (BID) | ORAL | Status: DC

## 2014-05-26 NOTE — Telephone Encounter (Signed)
Prescription printed.  Call placed to patient to make aware. No answer, no VM.

## 2014-05-29 ENCOUNTER — Encounter: Payer: Self-pay | Admitting: Family

## 2014-06-01 ENCOUNTER — Encounter: Payer: Self-pay | Admitting: Family

## 2014-06-01 ENCOUNTER — Ambulatory Visit (HOSPITAL_COMMUNITY)
Admission: RE | Admit: 2014-06-01 | Discharge: 2014-06-01 | Disposition: A | Discharge status: Home / Self Care | Source: Ambulatory Visit | Attending: Family | Admitting: Family

## 2014-06-01 ENCOUNTER — Ambulatory Visit (INDEPENDENT_AMBULATORY_CARE_PROVIDER_SITE_OTHER): Admitting: Family

## 2014-06-01 VITALS — BP 136/86 | HR 75 | Resp 16 | Ht 65.5 in | Wt 128.0 lb

## 2014-06-01 DIAGNOSIS — I6529 Occlusion and stenosis of unspecified carotid artery: Secondary | ICD-10-CM | POA: Insufficient documentation

## 2014-06-01 NOTE — Addendum Note (Signed)
Addended by: Dorthula Rue L on: 06/01/2014 11:03 AM   Modules accepted: Orders

## 2014-06-01 NOTE — Patient Instructions (Addendum)
Stroke Prevention Some medical conditions and behaviors are associated with an increased chance of having a stroke. You may prevent a stroke by making healthy choices and managing medical conditions. HOW CAN I REDUCE MY RISK OF HAVING A STROKE?   Stay physically active. Get at least 30 minutes of activity on most or all days.  Do not smoke. It may also be helpful to avoid exposure to secondhand smoke.  Limit alcohol use. Moderate alcohol use is considered to be:  No more than 2 drinks per day for men.  No more than 1 drink per day for nonpregnant women.  Eat healthy foods. This involves:  Eating 5 or more servings of fruits and vegetables a day.  Making dietary changes that address high blood pressure (hypertension), high cholesterol, diabetes, or obesity.  Manage your cholesterol levels.  Making food choices that are high in fiber and low in saturated fat, trans fat, and cholesterol may control cholesterol levels.  Take any prescribed medicines to control cholesterol as directed by your health care provider.  Manage your diabetes.  Controlling your carbohydrate and sugar intake is recommended to manage diabetes.  Take any prescribed medicines to control diabetes as directed by your health care provider.  Control your hypertension.  Making food choices that are low in salt (sodium), saturated fat, trans fat, and cholesterol is recommended to manage hypertension.  Take any prescribed medicines to control hypertension as directed by your health care provider.  Maintain a healthy weight.  Reducing calorie intake and making food choices that are low in sodium, saturated fat, trans fat, and cholesterol are recommended to manage weight.  Stop drug abuse.  Avoid taking birth control pills.  Talk to your health care provider about the risks of taking birth control pills if you are over 35 years old, smoke, get migraines, or have ever had a blood clot.  Get evaluated for sleep  disorders (sleep apnea).  Talk to your health care provider about getting a sleep evaluation if you snore a lot or have excessive sleepiness.  Take medicines only as directed by your health care provider.  For some people, aspirin or blood thinners (anticoagulants) are helpful in reducing the risk of forming abnormal blood clots that can lead to stroke. If you have the irregular heart rhythm of atrial fibrillation, you should be on a blood thinner unless there is a good reason you cannot take them.  Understand all your medicine instructions.  Make sure that other conditions (such as anemia or atherosclerosis) are addressed. SEEK IMMEDIATE MEDICAL CARE IF:   You have sudden weakness or numbness of the face, arm, or leg, especially on one side of the body.  Your face or eyelid droops to one side.  You have sudden confusion.  You have trouble speaking (aphasia) or understanding.  You have sudden trouble seeing in one or both eyes.  You have sudden trouble walking.  You have dizziness.  You have a loss of balance or coordination.  You have a sudden, severe headache with no known cause.  You have new chest pain or an irregular heartbeat. Any of these symptoms may represent a serious problem that is an emergency. Do not wait to see if the symptoms will go away. Get medical help at once. Call your local emergency services (911 in [place] Do not drive yourself to the hospital. Document Released: 10/26/2004 Document Revised: 02/02/2014 Document Reviewed: 03/21/2013 Exit[employer] Patient Information 2015 [employer]. This information is not intended to replace advice given   to you by your health care provider. Make sure you discuss any questions you have with your health care provider.   Smoking Cessation Quitting smoking is important to your health and has many advantages. However, it is not always easy to quit since nicotine is a very addictive drug. Oftentimes, people try 3 times or more  before being able to quit. This document explains the best ways for you to prepare to quit smoking. Quitting takes hard work and a lot of effort, but you can do it. ADVANTAGES OF QUITTING SMOKING  You will live longer, feel better, and live better.  Your body will feel the impact of quitting smoking almost immediately.  Within 20 minutes, blood pressure decreases. Your pulse returns to its normal level.  After 8 hours, carbon monoxide levels in the blood return to normal. Your oxygen level increases.  After 24 hours, the chance of having a heart attack starts to decrease. Your breath, hair, and body stop smelling like smoke.  After 48 hours, damaged nerve endings begin to recover. Your sense of taste and smell improve.  After 72 hours, the body is virtually free of nicotine. Your bronchial tubes relax and breathing becomes easier.  After 2 to 12 weeks, lungs can hold more air. Exercise becomes easier and circulation improves.  The risk of having a heart attack, stroke, cancer, or lung disease is greatly reduced.  After 1 year, the risk of coronary heart disease is cut in half.  After 5 years, the risk of stroke falls to the same as a nonsmoker.  After 10 years, the risk of lung cancer is cut in half and the risk of other cancers decreases significantly.  After 15 years, the risk of coronary heart disease drops, usually to the level of a nonsmoker.  If you are pregnant, quitting smoking will improve your chances of having a healthy baby.  The people you live with, especially any children, will be healthier.  You will have extra money to spend on things other than cigarettes. QUESTIONS TO THINK ABOUT BEFORE ATTEMPTING TO QUIT You may want to talk about your answers with your health care provider.  Why do you want to quit?  If you tried to quit in the past, what helped and what did not?  What will be the most difficult situations for you after you quit? How will you plan to  handle them?  Who can help you through the tough times? Your family? Friends? A health care provider?  What pleasures do you get from smoking? What ways can you still get pleasure if you quit? Here are some questions to ask your health care provider:  How can you help me to be successful at quitting?  What medicine do you think would be best for me and how should I take it?  What should I do if I need more help?  What is smoking withdrawal like? How can I get information on withdrawal? GET READY  Set a quit date.  Change your environment by getting rid of all cigarettes, ashtrays, matches, and lighters in your home, car, or work. Do not let people smoke in your home.  Review your past attempts to quit. Think about what worked and what did not. GET SUPPORT AND ENCOURAGEMENT You have a better chance of being successful if you have help. You can get support in many ways.  Tell your family, friends, and coworkers that you are going to quit and need their support. Ask them not   to smoke around you.  Get individual, group, or telephone counseling and support. Programs are available at local hospitals and health centers. Call your local health department for information about programs in your area.  Spiritual beliefs and practices may help some smokers quit.  Download a "quit meter" on your computer to keep track of quit statistics, such as how long you have gone without smoking, cigarettes not smoked, and money saved.  Get a self-help book about quitting smoking and staying off tobacco. LEARN NEW SKILLS AND BEHAVIORS  Distract yourself from urges to smoke. Talk to someone, go for a walk, or occupy your time with a task.  Change your normal routine. Take a different route to work. Drink tea instead of coffee. Eat breakfast in a different place.  Reduce your stress. Take a hot bath, exercise, or read a book.  Plan something enjoyable to do every day. Reward yourself for not  smoking.  Explore interactive web-based programs that specialize in helping you quit. GET MEDICINE AND USE IT CORRECTLY Medicines can help you stop smoking and decrease the urge to smoke. Combining medicine with the above behavioral methods and support can greatly increase your chances of successfully quitting smoking.  Nicotine replacement therapy helps deliver nicotine to your body without the negative effects and risks of smoking. Nicotine replacement therapy includes nicotine gum, lozenges, inhalers, nasal sprays, and skin patches. Some may be available over-the-counter and others require a prescription.  Antidepressant medicine helps people abstain from smoking, but how this works is unknown. This medicine is available by prescription.  Nicotinic receptor partial agonist medicine simulates the effect of nicotine in your brain. This medicine is available by prescription. Ask your health care provider for advice about which medicines to use and how to use them based on your health history. Your health care provider will tell you what side effects to look out for if you choose to be on a medicine or therapy. Carefully read the information on the package. Do not use any other product containing nicotine while using a nicotine replacement product.  RELAPSE OR DIFFICULT SITUATIONS Most relapses occur within the first 3 months after quitting. Do not be discouraged if you start smoking again. Remember, most people try several times before finally quitting. You may have symptoms of withdrawal because your body is used to nicotine. You may crave cigarettes, be irritable, feel very hungry, cough often, get headaches, or have difficulty concentrating. The withdrawal symptoms are only temporary. They are strongest when you first quit, but they will go away within 10-14 days. To reduce the chances of relapse, try to:  Avoid drinking alcohol. Drinking lowers your chances of successfully quitting.  Reduce the  amount of caffeine you consume. Once you quit smoking, the amount of caffeine in your body increases and can give you symptoms, such as a rapid heartbeat, sweating, and anxiety.  Avoid smokers because they can make you want to smoke.  Do not let weight gain distract you. Many smokers will gain weight when they quit, usually less than 10 pounds. Eat a healthy diet and stay active. You can always lose the weight gained after you quit.  Find ways to improve your mood other than smoking. FOR MORE INFORMATION  www.smokefree.gov  Document Released: 09/12/2001 Document Revised: 02/02/2014 Document Reviewed: 12/28/2011 Exit[employer] Patient Information 2015 [employer]. This information is not intended to replace advice given to you by your health care provider. Make sure you discuss any questions you have with your health care   provider.  

## 2014-06-01 NOTE — Progress Notes (Signed)
Established Carotid Patient   History of Present Illness  [name] is a 58 y.o. female patient of Dr. Trula Slade who returns today for follow up of carotid stenosis. About 2009 she had a syncopal episode, was evaluated at [facility], and states that evaluation was inconclusive for TIA or stroke. She denies headaches, denies postprandial abdominal pain, does not have hypertension.   Pt denies claudication symptoms with walking. Patient has not had previous carotid artery intervention.  Pt denies New Medical or Surgical History.  Pt Diabetic: No Pt smoker: smoker  (1 ppd, started at age 62 yrs)  Pt meds include: Statin : No: her insurance stopped paying for Crestor about 6 months ago. ASA: Yes Other anticoagulants/antiplatelets: no   Past Medical History  Diagnosis Date  . 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 ADHD (attention deficit hyperactivity disorder) 09/07/2011  . DVT (deep venous thrombosis) 09/07/2011  . Hypothyroid 09/07/2011  . Coronary artery disease   . Smoker 02/17/2013    Social History History  Substance Use Topics  . Smoking status: Former Research scientist (life sciences)  . Smokeless tobacco: Never Used  . Alcohol Use: No    Family History Family History  Problem Relation Age of Onset  . Cancer Father     Surgical History Past Surgical History  Procedure Laterality Date  . Breast lumpectomy  March 2013    Left breast    Allergies  Allergen Reactions  . Penicillins Hives    Current Outpatient Prescriptions  Medication Sig Dispense Refill  . amphetamine-dextroamphetamine (ADDERALL) 10 MG tablet Take 1 tablet (10 mg total) by mouth 2 (two) times daily.  60 tablet  0  . B Complex-C (B-COMPLEX WITH VITAMIN C) tablet Take 1 tablet by mouth daily.       . cholecalciferol (VITAMIN D) 1000 UNITS tablet Take 1,000 Units by mouth daily.      Marland Kitchen levothyroxine (SYNTHROID, LEVOTHROID) 75 MCG tablet TAKE 1 TABLET (75 MCG TOTAL) BY MOUTH DAILY.  30 tablet  5  . Multiple  Vitamin (MULTIVITAMIN) tablet Take 1 tablet by mouth daily.        . naproxen (NAPROSYN) 375 MG tablet TAKE 1 TABLET (375 MG TOTAL) BY MOUTH 2 (TWO) TIMES DAILY WITH A MEAL.  60 tablet  5  . NEXIUM 40 MG capsule TWICE A DAY  60 capsule  7  . rosuvastatin (CRESTOR) 10 MG tablet Take 1 tablet (10 mg total) by mouth daily.  30 tablet  3   No current facility-administered medications for this visit.    Review of Systems : See HPI for pertinent positives and negatives.  Physical Examination  Filed Vitals:   06/01/14 0954 06/01/14 0959  BP: 123/75 136/86  Pulse: 77 75  Resp:  16  Height:  5' 5.5" (1.664 m)  Weight:  128 lb (58.06 kg)  SpO2:  98%    General: WDWN female in NAD GAIT: normal Eyes: PERRLA Pulmonary:  Non-labored, CTAB, Negative  Rales, Negative rhonchi, & Negative wheezing.  Cardiac: regular Rhythm ,  Negative detected murmur.  VASCULAR EXAM Carotid Bruits Right Left   Negative Negative    Aorta is not palpable. Radial pulses are 2+ palpable and equal.  LE Pulses Right Left       POPLITEAL  not palpable   1+ palpable       POSTERIOR TIBIAL  2+ palpable   2+ palpable        DORSALIS PEDIS      ANTERIOR TIBIAL not palpable  2+ palpable     Gastrointestinal: soft, nontender, BS WNL, no r/g,  negative masses.  Musculoskeletal: Negative muscle atrophy/wasting. M/S 5/5 throughout, Extremities without ischemic changes.  Neurologic: A&O X 3; Appropriate Affect ; SENSATION ;normal;  Speech is normal CN 2-12 intact, Pain and light touch intact in extremities, Motor exam as listed above.   Non-Invasive Vascular Imaging CAROTID DUPLEX 06/01/2014   CEREBROVASCULAR DUPLEX EVALUATION    INDICATION: Carotid stenosis    PREVIOUS INTERVENTION(S):     DUPLEX EXAM:     RIGHT  LEFT  Peak Systolic Velocities (cm/s) End Diastolic Velocities  (cm/s) Plaque LOCATION Peak Systolic Velocities (cm/s) End Diastolic Velocities (cm/s) Plaque  126 21  CCA PROXIMAL 144 34   108 26  CCA MID 87 27   90 26  CCA DISTAL 80 24   84 22  ECA 81 16   93 34 HT ICA PROXIMAL 76 32   121/129 44/53  ICA MID 116 52   126 48  ICA DISTAL 103 46     1.19 ICA / CCA Ratio (PSV) 1.33  Antegrade Vertebral Flow Antegrade  970 Brachial Systolic Pressure (mmHg) 263  Triphasic Brachial Artery Waveforms Triphasic    Plaque Morphology:  HM = Homogeneous, HT = Heterogeneous, CP = Calcific Plaque, SP = Smooth Plaque, IP = Irregular Plaque     ADDITIONAL FINDINGS:     IMPRESSION: Patent bilateral internal carotid arteries with elevated velocities at the mid/distal segments suggestive of 40%-59% stenosis, however no plaque is visualized as well as minimal vessel tortuousity. This may also be suggestive of fibromuscular dysplasia.    Compared to the previous exam:  Essentially unchanged since previous study 03/20/2013.     Assessment: DEDRA Ryan is a 58 y.o. female who presents with asymptomatic patent bilateral internal carotid arteries with elevated velocities at the mid/distal segments suggestive of 40%-59% stenosis, however no plaque is visualized as well as minimal vessel tortuousity. This may also be suggestive of fibromuscular dysplasia. Essentially unchanged since previous study 03/20/2013. Unfortunately she continues to smoke a ppd, but has moved to e-cigarettes and plans to reduce the amount of nicotine consumed.   Plan: Follow-up in 1 year with Carotid Duplex scan.  Pt was counseled re smoking cessation.  I discussed in depth with the patient the nature of atherosclerosis, and emphasized the importance of maximal medical management including strict control of blood pressure, blood glucose, and lipid levels, obtaining regular exercise, and cessation of smoking.  The patient is aware that without maximal medical management the underlying  atherosclerotic disease process will progress, limiting the benefit of any interventions. The patient was given information about stroke prevention and what symptoms should prompt the patient to seek immediate medical care. Thank you for allowing [place] to participate in this patient's care.  Clemon Chambers, RN, MSN, FNP-C [facility] Office: [facility] Physician: Trula Slade  06/01/2014 9:52 AM

## 2014-06-22 ENCOUNTER — Telehealth: Payer: Self-pay | Admitting: Family Medicine

## 2014-06-22 DIAGNOSIS — F909 Attention-deficit hyperactivity disorder, unspecified type: Secondary | ICD-10-CM

## 2014-06-22 NOTE — Telephone Encounter (Signed)
Last Refill 05/26/2014 # 60.  This patient has not been seen in this office since September 2014 (last year)  OK refill??

## 2014-06-22 NOTE — Telephone Encounter (Signed)
Ok but ntbs 

## 2014-06-22 NOTE — Telephone Encounter (Signed)
Patient is calling to get refill on adderall   [phone]

## 2014-06-23 MED ORDER — AMPHETAMINE-DEXTROAMPHETAMINE 10 MG PO TABS: 10.0000 mg | ORAL_TABLET | Freq: Two times a day (BID) | ORAL | Status: DC

## 2014-06-23 NOTE — Telephone Encounter (Signed)
Pt called.  Told refill will be ready for pick up after 2PM Thursday.  Patient also told HAS NOT been seen in over one year.  MUST have appt here before refilling anymore medications

## 2014-06-25 ENCOUNTER — Telehealth: Payer: Self-pay | Admitting: Hematology and Oncology

## 2014-06-25 NOTE — Telephone Encounter (Signed)
, °

## 2014-06-30 ENCOUNTER — Other Ambulatory Visit

## 2014-06-30 DIAGNOSIS — E039 Hypothyroidism, unspecified: Secondary | ICD-10-CM

## 2014-06-30 DIAGNOSIS — Z79899 Other long term (current) drug therapy: Secondary | ICD-10-CM

## 2014-06-30 DIAGNOSIS — F17200 Nicotine dependence, unspecified, uncomplicated: Secondary | ICD-10-CM

## 2014-06-30 DIAGNOSIS — F909 Attention-deficit hyperactivity disorder, unspecified type: Secondary | ICD-10-CM

## 2014-06-30 DIAGNOSIS — Z0000 Encounter for general adult medical examination without abnormal findings: Secondary | ICD-10-CM

## 2014-06-30 DIAGNOSIS — E785 Hyperlipidemia, unspecified: Secondary | ICD-10-CM

## 2014-06-30 DIAGNOSIS — D0512 Intraductal carcinoma in situ of left breast: Secondary | ICD-10-CM

## 2014-06-30 LAB — CBC WITH DIFFERENTIAL/PLATELET
Basophils Absolute: 0 10*3/uL (ref 0.0–0.1)
Basophils Relative: 0 % (ref 0–1)
Eosinophils Absolute: 0.3 10*3/uL (ref 0.0–0.7)
Eosinophils Relative: 3 % (ref 0–5)
HCT: 39.3 % (ref 36.0–46.0)
Hemoglobin: 13.6 g/dL (ref 12.0–15.0)
Lymphocytes Relative: 19 % (ref 12–46)
Lymphs Abs: 2.1 10*3/uL (ref 0.7–4.0)
MCH: 32.5 pg (ref 26.0–34.0)
MCHC: 34.6 g/dL (ref 30.0–36.0)
MCV: 93.8 fL (ref 78.0–100.0)
Monocytes Absolute: 0.7 10*3/uL (ref 0.1–1.0)
Monocytes Relative: 6 % (ref 3–12)
Neutro Abs: 7.8 10*3/uL — ABNORMAL HIGH (ref 1.7–7.7)
Neutrophils Relative %: 72 % (ref 43–77)
Platelets: 485 10*3/uL — ABNORMAL HIGH (ref 150–400)
RBC: 4.19 MIL/uL (ref 3.87–5.11)
RDW: 14.6 % (ref 11.5–15.5)
WBC: 10.9 10*3/uL — ABNORMAL HIGH (ref 4.0–10.5)

## 2014-06-30 LAB — COMPLETE METABOLIC PANEL WITH GFR
ALT: 18 U/L (ref 0–35)
AST: 21 U/L (ref 0–37)
Albumin: 4.2 g/dL (ref 3.5–5.2)
Alkaline Phosphatase: 84 U/L (ref 39–117)
BUN: 12 mg/dL (ref 6–23)
CO2: 23 mEq/L (ref 19–32)
Calcium: 9.7 mg/dL (ref 8.4–10.5)
Chloride: 99 mEq/L (ref 96–112)
Creat: 0.64 mg/dL (ref 0.50–1.10)
GFR, Est African American: >89 mL/min
GFR, Est Non African American: >89 mL/min
Glucose, Bld: 83 mg/dL (ref 70–99)
Potassium: 4.9 mEq/L (ref 3.5–5.3)
Sodium: 134 mEq/L — ABNORMAL LOW (ref 135–145)
Total Bilirubin: 0.4 mg/dL (ref 0.2–1.2)
Total Protein: 6.7 g/dL (ref 6.0–8.3)

## 2014-06-30 LAB — TSH: TSH: 1.539 u[IU]/mL (ref 0.350–4.500)

## 2014-07-01 LAB — VITAMIN D 25 HYDROXY (VIT D DEFICIENCY, FRACTURES): Vit D, 25-Hydroxy: 82 ng/mL (ref 30–89)

## 2014-07-03 LAB — NMR LIPOPROFILE WITH LIPIDS
Cholesterol, Total: 184 mg/dL (ref 100–199)
HDL Particle Number: 41.3 umol/L (ref >=30.5)
HDL Size: 9.4 nm (ref >=9.2)
HDL-C: 68 mg/dL (ref >39)
LDL (calc): 97 mg/dL (ref 0–99)
LDL Particle Number: 1057 nmol/L — ABNORMAL HIGH (ref <1000)
LDL Size: 20.8 nm (ref >=20.8)
LP-IR Score: 57 — ABNORMAL HIGH (ref <=45)
Large HDL-P: 8.1 umol/L (ref >=4.8)
Large VLDL-P: 5.4 nmol/L — ABNORMAL HIGH (ref <=2.7)
Small LDL Particle Number: 384 nmol/L (ref <=527)
Triglycerides: 94 mg/dL (ref 0–149)
VLDL Size: 59.4 nm — ABNORMAL HIGH (ref <=46.6)

## 2014-07-17 ENCOUNTER — Other Ambulatory Visit: Payer: Self-pay | Admitting: Family Medicine

## 2014-07-20 ENCOUNTER — Encounter: Payer: Self-pay | Admitting: Family Medicine

## 2014-07-20 ENCOUNTER — Ambulatory Visit (INDEPENDENT_AMBULATORY_CARE_PROVIDER_SITE_OTHER): Admitting: Family Medicine

## 2014-07-20 VITALS — BP 140/90 | HR 80 | Temp 98.2°F | Resp 16 | Ht 65.5 in | Wt 130.0 lb

## 2014-07-20 DIAGNOSIS — Z0000 Encounter for general adult medical examination without abnormal findings: Secondary | ICD-10-CM

## 2014-07-20 DIAGNOSIS — F909 Attention-deficit hyperactivity disorder, unspecified type: Secondary | ICD-10-CM

## 2014-07-20 DIAGNOSIS — Z23 Encounter for immunization: Secondary | ICD-10-CM

## 2014-07-20 MED ORDER — ROSUVASTATIN CALCIUM 10 MG PO TABS: 10.0000 mg | ORAL_TABLET | Freq: Every day | ORAL | Status: DC

## 2014-07-20 MED ORDER — AMPHETAMINE-DEXTROAMPHETAMINE 10 MG PO TABS: 10.0000 mg | ORAL_TABLET | Freq: Two times a day (BID) | ORAL | Status: DC

## 2014-07-20 NOTE — Progress Notes (Signed)
Subjective:    Patient ID: [name], female    DOB: 10-08-1955, 58 y.o.   MRN: [id number]  HPI  Patient is a very pleasant 58 year old white female who is here today for complete physical exam. She has a history of a hysterectomy and therefore does not require Pap smear. She also has a history of ductal carcinoma in situ of the left breast. She had a mammogram in May which was normal. She sees her oncologist later this year. Her colonoscopy is due this year and she will schedule this. She is overdue for a bone density test. She is also due for her flu vaccine as well as her tetanus shot. She declines a tetanus shot today but she would like a flu vaccine. Recently he had an ultrasound performed of carotid arteries to monitor her carotid stenosis. This revealed a 40-59% blockage. However there was no plaque visibly seen. 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 The patient continues to smoke. Lab on 06/30/2014  Component Date Value Ref Range Status  . WBC 06/30/2014 10.9* 4.0 - 10.5 K/uL Final  . RBC 06/30/2014 4.19  3.87 - 5.11 MIL/uL Final  . Hemoglobin 06/30/2014 13.6  12.0 - 15.0 g/dL Final  . HCT 06/30/2014 39.3  36.0 - 46.0 % Final  . MCV 06/30/2014 93.8  78.0 - 100.0 fL Final  . MCH 06/30/2014 32.5  26.0 - 34.0 pg Final  . MCHC 06/30/2014 34.6  30.0 - 36.0 g/dL Final  . RDW 06/30/2014 14.6  11.5 - 15.5 % Final  . Platelets 06/30/2014 485* 150 - 400 K/uL Final  . Neutrophils Relative % 06/30/2014 72  43 - 77 % Final  . Neutro Abs 06/30/2014 7.8* 1.7 - 7.7 K/uL Final  . Lymphocytes Relative 06/30/2014 19  12 - 46 % Final  . Lymphs Abs 06/30/2014 2.1  0.7 - 4.0 K/uL Final  . Monocytes Relative 06/30/2014 6  3 - 12 % Final  . Monocytes Absolute 06/30/2014 0.7  0.1 - 1.0 K/uL Final  . Eosinophils Relative 06/30/2014 3  0 - 5 % Final  . Eosinophils Absolute 06/30/2014 0.3  0.0 - 0.7 K/uL Final  . Basophils Relative 06/30/2014 0  0 - 1 % Final  . Basophils Absolute  06/30/2014 0.0  0.0 - 0.1 K/uL Final  . Smear Review 06/30/2014 Criteria for review not met   Final  . Vit D, 25-Hydroxy 06/30/2014 82  30 - 89 ng/mL Final   Comment: This assay accurately quantifies Vitamin D, which is the sum of the                          25-Hydroxy forms of Vitamin D2 and D3.  Studies have shown that the                          optimum concentration of 25-Hydroxy Vitamin D is 30 ng/mL or higher.                           Concentrations of Vitamin D between 20 and 29 ng/mL are considered to                          be insufficient and concentrations less than 20 ng/mL are considered  to be deficient for Vitamin D.  . TSH 06/30/2014 1.539  0.350 - 4.500 uIU/mL Final  . Sodium 06/30/2014 134* 135 - 145 mEq/L Final  . Potassium 06/30/2014 4.9  3.5 - 5.3 mEq/L Final  . Chloride 06/30/2014 99  96 - 112 mEq/L Final  . CO2 06/30/2014 23  19 - 32 mEq/L Final  . Glucose, Bld 06/30/2014 83  70 - 99 mg/dL Final  . BUN 06/30/2014 12  6 - 23 mg/dL Final  . Creat 06/30/2014 0.64  0.50 - 1.10 mg/dL Final  . Total Bilirubin 06/30/2014 0.4  0.2 - 1.2 mg/dL Final  . Alkaline Phosphatase 06/30/2014 84  39 - 117 U/L Final  . AST 06/30/2014 21  0 - 37 U/L Final  . ALT 06/30/2014 18  0 - 35 U/L Final  . Total Protein 06/30/2014 6.7  6.0 - 8.3 g/dL Final  . Albumin 06/30/2014 4.2  3.5 - 5.2 g/dL Final  . Calcium 06/30/2014 9.7  8.4 - 10.5 mg/dL Final  . GFR, Est African American 06/30/2014 >89   Final  . GFR, Est Non African American 06/30/2014 >89   Final   Comment:                            The estimated GFR is a calculation valid for adults (>=39 years old)                          that uses the CKD-EPI algorithm to adjust for age and sex. It is                            not to be used for children, pregnant women, hospitalized patients,                             patients on dialysis, or with rapidly changing kidney function.                           According to the NKDEP, eGFR >89 is normal, 60-89 shows mild                          impairment, 30-59 shows moderate impairment, 15-29 shows severe                          impairment and <15 is ESRD.                             Marland Kitchen LDL Particle Number 06/30/2014 1057* <1000 nmol/L Final   Comment:                           Low                   < 1000                                                    Moderate         1000 -  1299                                                    Borderline-High  1300 - 1599                                                    High             1600 - 2000                                                    Very High             > 2000  . LDL (calc) 06/30/2014 97  0 - 99 mg/dL Final   Comment:                           Optimal               <  100                                                    Above optimal     100 -  129                                                    Borderline        130 -  159                                                    High              160 -  189                                                    Very high             >  189                          LDL-C is inaccurate if patient is non-fasting.  Marland Kitchen HDL-C 06/30/2014 68  >39 mg/dL Final  . Triglycerides 06/30/2014 94  0 - 149 mg/dL Final  . Cholesterol, Total 06/30/2014 184  100 - 199 mg/dL Final  . HDL Particle Number 06/30/2014 41.3  >=30.5 umol/L Final  . Large HDL-P 06/30/2014 8.1  >=4.8 umol/L Final  . Large VLDL-P  06/30/2014 5.4* <=2.7 nmol/L Final  . Small LDL Particle Number 06/30/2014 384  <=527 nmol/L Final  . LDL Size 06/30/2014 20.8  >=20.8 nm Final  . HDL Size 06/30/2014 9.4  >=9.2 nm Final  . VLDL Size 06/30/2014 59.4* <=46.6 nm Final  . LP-IR Score 06/30/2014 57* <=45 Final   Comment:  ----------------------------------------------------------                                       INSULIN RESISTANCE / DIABETES RISK MARKERS                                      <--Insulin Sensitive      Insulin Resistant-->                                                Percentile in Reference Population                            Large VLDL-P      Low     25th     50th     75th     High                                              <0.9    0.9      2.7      6.9      >6.9                            Small LDL-P       Low     25th     50th     75th     High                                              <117    117      527      839      >839                            Large HDL-P       High    75th     50th     25th     Low                                              >7.3    7.3      4.8      3.1      <3.1                            VLDL Size  Small   25th     50th     75th     Large                                              <42.4   42.4     46.6     52.5     >52.5                            LDL Size          Large   75th     50th     25th     Small                                              >21.2   21.2     20.8     20.4     <20.4                            HDL Size          Large   75th     50th     25th     Small                                              >9.6    9.6      9.2      8.9      <8.9                            Insulin Resistance Score                            LP-IR SCORE       Low     25th     50th     75th     High                                              <27     27       45       63       >63                             __________________________________________________________                          LP-IR Score is inaccurate if patient is non-fasting.                          The LP-IR score is a laboratory developed index that has been  associated with insulin resistance and diabetes risk and should be                          used as one component of a physician's clinical assessment. Neither                          the LP-IR score nor the subclasses listed above have been cleared                           by the [place] Food and Drug Administration.   Past Medical History  Diagnosis Date  . 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 ADHD (attention deficit hyperactivity disorder) 09/07/2011  . DVT (deep venous thrombosis) 09/07/2011  . Hypothyroid 09/07/2011  . Coronary artery disease   . Smoker 02/17/2013  . Carotid artery occlusion    Past Surgical History  Procedure Laterality Date  . Breast lumpectomy  March 2013    Left breast  . Eye surgery Right Sept. 2014    Lasix - Laser   Current Outpatient Prescriptions on File Prior to Visit  Medication Sig Dispense Refill  . B Complex-C (B-COMPLEX WITH VITAMIN C) tablet Take 1 tablet by mouth daily.       . cholecalciferol (VITAMIN D) 1000 UNITS tablet Take 1,000 Units by mouth daily.      Marland Kitchen levothyroxine (SYNTHROID, LEVOTHROID) 75 MCG tablet TAKE 1 TABLET (75 MCG TOTAL) BY MOUTH DAILY.  30 tablet  5  . Multiple Vitamin (MULTIVITAMIN) tablet Take 1 tablet by mouth daily.        . naproxen (NAPROSYN) 375 MG tablet TAKE 1 TABLET (375 MG TOTAL) BY MOUTH 2 (TWO) TIMES DAILY WITH A MEAL.  60 tablet  5  . NEXIUM 40 MG capsule TWICE A DAY  60 capsule  7   No current facility-administered medications on file prior to visit.   Allergies  Allergen Reactions  . Penicillins Hives   History   Social History  . Marital Status: Divorced    Spouse Name: N/A    Number of Children: N/A  . Years of Education: N/A   Occupational History  . Not on file.   Social History Main Topics  . Smoking status: Light Tobacco Smoker    Types: E-cigarettes  . Smokeless tobacco: Never Used  . Alcohol Use: No  . Drug Use: No  . Sexual Activity: No   Other Topics Concern  . Not on file   Social History Narrative  . No narrative on file   Family History  Problem Relation Age of Onset  . Cancer Father     Stomach  . Crohn's disease Daughter      Review of Systems  All other systems reviewed and are negative.      Objective:    Physical Exam  Vitals reviewed. Constitutional: She is oriented to person, place, and time. She appears well-developed and well-nourished. No distress.  HENT:  Head: Normocephalic and atraumatic.  Right Ear: External ear normal.  Left Ear: External ear normal.  Nose: Nose normal.  Mouth/Throat: Oropharynx is clear and moist. No oropharyngeal exudate.  Eyes: Conjunctivae and EOM are normal. Pupils are equal, round, and reactive to light. Right eye exhibits no discharge. Left eye exhibits no discharge. No scleral icterus.  Neck: Normal range of motion. Neck supple. No JVD present. No tracheal  deviation present. No thyromegaly present.  Cardiovascular: Normal rate, regular rhythm, normal heart sounds and intact distal pulses.  Exam reveals no gallop and no friction rub.   No murmur heard. Pulmonary/Chest: Effort normal and breath sounds normal. No stridor. No respiratory distress. She has no wheezes. She has no rales. She exhibits no tenderness.  Abdominal: Soft. Bowel sounds are normal. She exhibits no distension and no mass. There is no tenderness. There is no rebound and no guarding.  Musculoskeletal: Normal range of motion. She exhibits no edema and no tenderness.  Lymphadenopathy:    She has no cervical adenopathy.  Neurological: She is alert and oriented to person, place, and time. She has normal reflexes. She displays normal reflexes. No cranial nerve deficit. She exhibits normal muscle tone. Coordination normal.  Skin: Skin is warm. No rash noted. She is not diaphoretic. No erythema. No pallor.  Psychiatric: She has a normal mood and affect. Her behavior is normal. Judgment and thought content normal.          Assessment & Plan:  Routine general medical examination at a health care facility - Plan: rosuvastatin (CRESTOR) 10 MG tablet, DG Bone Density  Attention deficit hyperactivity disorder (ADHD), unspecified ADHD type - Plan: amphetamine-dextroamphetamine (ADDERALL) 10 MG  tablet  Physical exam is completely normal. The patient does have some mild wheezing on examination. I believe she is developing the first signs of emphysema. I again encouraged the patient to discontinue smoking. I will give her the flu shot today. I recommended she continue an aspirin. However I am not sure that her LDL should be less than 70. Given the fact that the carotid ultrasound revealed fibromuscular dysplasia, I believe he can reduce her LDL goal to less than 100.  I would like the patient to resume Crestor 10 mg by mouth daily as long as she smokes. She will give up smoking, I believe we can safely discontinue her statin medication. I scheduled the patient for a bone density test..

## 2014-07-20 NOTE — Addendum Note (Signed)
Addended by: Shary Decamp B on: 07/20/2014 09:33 AM   Modules accepted: Orders

## 2014-07-21 ENCOUNTER — Telehealth: Payer: Self-pay | Admitting: *Deleted

## 2014-07-21 NOTE — Telephone Encounter (Signed)
Received fax requesting PA on Crestor.   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   PA submitted.

## 2014-07-28 NOTE — Telephone Encounter (Signed)
Received PA determination.   PA approved.   07/23/2014- 10/01/2098.

## 2014-07-31 ENCOUNTER — Ambulatory Visit: Admitting: Oncology

## 2014-07-31 ENCOUNTER — Other Ambulatory Visit

## 2014-08-03 ENCOUNTER — Other Ambulatory Visit: Payer: Self-pay | Admitting: *Deleted

## 2014-08-03 DIAGNOSIS — D0510 Intraductal carcinoma in situ of unspecified breast: Secondary | ICD-10-CM

## 2014-08-04 ENCOUNTER — Ambulatory Visit (HOSPITAL_BASED_OUTPATIENT_CLINIC_OR_DEPARTMENT_OTHER): Admitting: Hematology and Oncology

## 2014-08-04 ENCOUNTER — Telehealth: Payer: Self-pay | Admitting: Hematology and Oncology

## 2014-08-04 ENCOUNTER — Other Ambulatory Visit (HOSPITAL_BASED_OUTPATIENT_CLINIC_OR_DEPARTMENT_OTHER)

## 2014-08-04 ENCOUNTER — Telehealth: Payer: Self-pay | Admitting: Oncology

## 2014-08-04 ENCOUNTER — Telehealth: Payer: Self-pay | Admitting: Family Medicine

## 2014-08-04 VITALS — BP 139/83 | HR 91 | Temp 98.4°F | Resp 18 | Ht 65.5 in | Wt 128.3 lb

## 2014-08-04 DIAGNOSIS — D72829 Elevated white blood cell count, unspecified: Secondary | ICD-10-CM

## 2014-08-04 DIAGNOSIS — D0510 Intraductal carcinoma in situ of unspecified breast: Secondary | ICD-10-CM

## 2014-08-04 DIAGNOSIS — Z853 Personal history of malignant neoplasm of breast: Secondary | ICD-10-CM

## 2014-08-04 DIAGNOSIS — C50412 Malignant neoplasm of upper-outer quadrant of left female breast: Secondary | ICD-10-CM

## 2014-08-04 LAB — COMPREHENSIVE METABOLIC PANEL (CC13)
ALT: 26 U/L (ref 0–55)
AST: 24 U/L (ref 5–34)
Albumin: 4.1 g/dL (ref 3.5–5.0)
Alkaline Phosphatase: 88 U/L (ref 40–150)
Anion Gap: 10 mEq/L (ref 3–11)
BUN: 10.1 mg/dL (ref 7.0–26.0)
CO2: 23 mEq/L (ref 22–29)
Calcium: 9.4 mg/dL (ref 8.4–10.4)
Chloride: 104 mEq/L (ref 98–109)
Creatinine: 0.8 mg/dL (ref 0.6–1.1)
Glucose: 88 mg/dl (ref 70–140)
Potassium: 4.4 mEq/L (ref 3.5–5.1)
Sodium: 137 mEq/L (ref 136–145)
Total Bilirubin: 0.45 mg/dL (ref 0.20–1.20)
Total Protein: 7.1 g/dL (ref 6.4–8.3)

## 2014-08-04 LAB — CBC WITH DIFFERENTIAL/PLATELET
BASO%: 1 % (ref 0.0–2.0)
Basophils Absolute: 0.1 10*3/uL (ref 0.0–0.1)
EOS%: 3.7 % (ref 0.0–7.0)
Eosinophils Absolute: 0.4 10*3/uL (ref 0.0–0.5)
HCT: 42.8 % (ref 34.8–46.6)
HGB: 14.2 g/dL (ref 11.6–15.9)
LYMPH%: 18.1 % (ref 14.0–49.7)
MCH: 32.4 pg (ref 25.1–34.0)
MCHC: 33.1 g/dL (ref 31.5–36.0)
MCV: 97.9 fL (ref 79.5–101.0)
MONO#: 0.7 10*3/uL (ref 0.1–0.9)
MONO%: 5.7 % (ref 0.0–14.0)
NEUT#: 8.3 10*3/uL — ABNORMAL HIGH (ref 1.5–6.5)
NEUT%: 71.5 % (ref 38.4–76.8)
Platelets: 455 10*3/uL — ABNORMAL HIGH (ref 145–400)
RBC: 4.37 10*6/uL (ref 3.70–5.45)
RDW: 13.6 % (ref 11.2–14.5)
WBC: 11.6 10*3/uL — ABNORMAL HIGH (ref 3.9–10.3)
lymph#: 2.1 10*3/uL (ref 0.9–3.3)

## 2014-08-04 NOTE — Telephone Encounter (Signed)
[facility] [address]  Patient is calling to let us know that another [facility] physician has called and let her know that she has some sort of infection and would like to know if we can call something in for her  [phone]

## 2014-08-04 NOTE — Telephone Encounter (Signed)
, °

## 2014-08-04 NOTE — Progress Notes (Signed)
Patient Care Team: [name], MD as PCP - General (Family Medicine)  DIAGNOSIS: high-grade DCIS.  SUMMARY OF ONCOLOGIC HISTORY:   Breast cancer of upper-outer quadrant of left female breast   01/30/2007 Surgery Left breast lumpectomy: High-grade DCIS with focal necrosis 1.7 cm EF 54%, PR 74%   03/26/2007 - 05/13/2007 Radiation Therapy Adjuvant radiation therapy   06/05/2007 -  Anti-estrogen oral therapy Arimidex 1 mg daily    CHIEF COMPLIANT: one-year followup of DCIS  INTERVAL HISTORY: [name] is a10 year old Caucasian lady with above-mentioned history of DCIS involving the left breast. It is 1.7 cm in size it was ER/PR positive. She received 5 years of antiestrogen therapy with Arimidex 1 mg daily. She reports no new problems or concerns other than chronic bronchitis symptoms. She's also noticed some visual changes with some flashing of lights symptoms. Patient reports no problems with her breasts in terms of lumps or nodules.   REVIEW OF SYSTEMS:   Constitutional: Denies fevers, chills or abnormal weight loss Eyes: flashes of light Ears, nose, mouth, throat, and face: Denies mucositis or sore throat Respiratory: Denies cough, dyspnea or wheezes Cardiovascular: Denies palpitation, chest discomfort or lower extremity swelling Gastrointestinal:  Denies nausea, heartburn or change in bowel habits Skin: Denies abnormal skin rashes Lymphatics: Denies new lymphadenopathy or easy bruising Neurological:Denies numbness, tingling or new weaknesses Behavioral/Psych: Mood is stable, no new changes  Breast:  denies any pain or lumps or nodules in either breasts All other systems were reviewed with the patient and are negative.  I have reviewed the past medical history, past surgical history, social history and family history with the patient and they are unchanged from previous note.  ALLERGIES:  is allergic to penicillins.  MEDICATIONS:  Current Outpatient Prescriptions   Medication Sig Dispense Refill  . amphetamine-dextroamphetamine (ADDERALL) 10 MG tablet Take 1 tablet (10 mg total) by mouth 2 (two) times daily. 60 tablet 0  . B Complex-C (B-COMPLEX WITH VITAMIN C) tablet Take 1 tablet by mouth daily.     . cholecalciferol (VITAMIN D) 1000 UNITS tablet Take 1,000 Units by mouth daily.    Marland Kitchen levothyroxine (SYNTHROID, LEVOTHROID) 75 MCG tablet TAKE 1 TABLET (75 MCG TOTAL) BY MOUTH DAILY. 30 tablet 5  . Multiple Vitamin (MULTIVITAMIN) tablet Take 1 tablet by mouth daily.      . naproxen (NAPROSYN) 375 MG tablet TAKE 1 TABLET (375 MG TOTAL) BY MOUTH 2 (TWO) TIMES DAILY WITH A MEAL. 60 tablet 5  . NEXIUM 40 MG capsule TWICE A DAY 60 capsule 7  . rosuvastatin (CRESTOR) 10 MG tablet Take 1 tablet (10 mg total) by mouth daily. 90 tablet 3   No current facility-administered medications for this visit.    PHYSICAL EXAMINATION: ECOG PERFORMANCE STATUS: 1 - Symptomatic but completely ambulatory  Filed Vitals:   08/04/14 0838  BP: 139/83  Pulse: 91  Temp: 98.4 F (36.9 C)  Resp: 18   Filed Weights   08/04/14 0838  Weight: 128 lb 4.8 oz (58.196 kg)    GENERAL:alert, no distress and comfortable SKIN: skin color, texture, turgor are normal, no rashes or significant lesions EYES: normal, Conjunctiva are pink and non-injected, sclera clear; OROPHARYNX:no exudate, no erythema and lips, buccal mucosa, and tongue normal  NECK: supple, thyroid normal size, non-tender, without nodularity LYMPH:  no palpable lymphadenopathy in the cervical, axillary or inguinal LUNGS: clear to auscultation and percussion with normal breathing effort HEART: regular rate & rhythm and no murmurs and no lower extremity  edema ABDOMEN:abdomen soft, non-tender and normal bowel sounds Musculoskeletal:no cyanosis of digits and no clubbing  NEURO: alert & oriented x 3 with fluent speech, no focal motor/sensory deficits BREAST: No palpable masses or nodules in either right or left breasts.  No palpable axillary supraclavicular or infraclavicular adenopathy no breast tenderness or nipple discharge.   LABORATORY DATA:  I have reviewed the data as listed   Chemistry      Component Value Date/Time   NA 134* 06/30/2014 1141   NA 138 07/03/2012 1416   K 4.9 06/30/2014 1141   K 4.1 07/03/2012 1416   CL 99 06/30/2014 1141   CL 105 07/03/2012 1416   CO2 23 06/30/2014 1141   CO2 21* 07/03/2012 1416   BUN 12 06/30/2014 1141   BUN 16.0 07/03/2012 1416   CREATININE 0.64 06/30/2014 1141   CREATININE 0.8 07/03/2012 1416   CREATININE 0.64 03/07/2012 1206      Component Value Date/Time   CALCIUM 9.7 06/30/2014 1141   CALCIUM 9.5 07/03/2012 1416   ALKPHOS 84 06/30/2014 1141   ALKPHOS 95 07/03/2012 1416   AST 21 06/30/2014 1141   AST 22 07/03/2012 1416   ALT 18 06/30/2014 1141   ALT 22 07/03/2012 1416   BILITOT 0.4 06/30/2014 1141   BILITOT 0.30 07/03/2012 1416       Lab Results  Component Value Date   WBC 11.6* 08/04/2014   HGB 14.2 08/04/2014   HCT 42.8 08/04/2014   MCV 97.9 08/04/2014   PLT 455* 08/04/2014   NEUTROABS 8.3* 08/04/2014    ASSESSMENT & PLAN:  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 She will continue annual mammograms and annual physical exams. Survivorship: Encouraged her to do physical exercise activity and to decrease red meat consumption and increase more fruits and vegetables I discussed with her the importance of quitting. I reviewed her blood work which showed elevated white count which I suspect is secondary to chronic bronchitis. Return to clinic one year for followup after a mammogram some to do physical exams of the breasts. We would not do blood work  at the next visit since she gets blood work done by her primary care physician annually.  Visual symptoms: I instructed her to immediately call an ophthalmologist and be checked out.  Orders Placed This Encounter  Procedures  . DG Bone Density    Standing Status: Future     Number of Occurrences:      Standing Expiration Date: 08/04/2015    Order Specific Question:  Reason for Exam (SYMPTOM  OR DIAGNOSIS REQUIRED)    Answer:  post menopausal previously treated with aromatase inhibitor therapy    Order Specific Question:  Is the patient pregnant?    Answer:  No    Order Specific Question:  Preferred imaging location?    Answer:  [facility]   The patient has a good understanding of the overall plan. she agrees with it. She will call with any problems that may develop before her next visit here.  I spent 15 minutes counseling the patient face to face. The total time spent in the appointment was 15 minutes and more than 50% was on counseling and review of test results  Rulon Eisenmenger, MD 08/04/2014 9:05 AM

## 2014-08-04 NOTE — Assessment & Plan Note (Addendum)
High-grade DCIS left breast diagnosed in April 2008 status post lumpectomy followed by radiation 1.7 cm ER 54% PR 74% followed by radiation and is currently on adjuvant Arimidex therapy since September 2008 stopped after 5 years  Surveillance: Breast exam was normal today. She will continue annual mammograms and annual physical exams. Survivorship: Encouraged her to do physical exercise activity and to decrease red meat consumption and increase more fruits and vegetables I discussed with her the importance of quitting. I reviewed her blood work which showed elevated white count which I suspect is secondary to chronic bronchitis. Return to clinic one year for followup after a mammogram some to do physical exams of the breasts. We would not do blood work at the next visit since she gets blood work done by her primary care physician annually.

## 2014-08-05 NOTE — Telephone Encounter (Signed)
lmtrc

## 2014-08-05 NOTE — Telephone Encounter (Signed)
Pt states that her cancer md told her that her labs were off and it looked liked she had an infection. Pt states that she has had a lingering cough and she mentioned this to you in her LOV and she was wondering if we could call her in an antibx without her being seen as she feels she has bronchitis!?!? Pt was informed that it would be 08/06/14 before she got a call back but if she gets worse or started to have a fever to call us back asap.

## 2014-08-06 ENCOUNTER — Other Ambulatory Visit: Payer: Self-pay | Admitting: Family Medicine

## 2014-08-06 MED ORDER — ESOMEPRAZOLE MAGNESIUM 40 MG PO CPDR: DELAYED_RELEASE_CAPSULE | ORAL | Status: DC

## 2014-08-06 MED ORDER — AZITHROMYCIN 250 MG PO TABS: ORAL_TABLET | ORAL | Status: DC

## 2014-08-06 NOTE — Telephone Encounter (Signed)
Pt aware, med called out and pt will return for labs in 2 weeks

## 2014-08-06 NOTE — Telephone Encounter (Signed)
LMTRC

## 2014-08-06 NOTE — Telephone Encounter (Signed)
Ok with zpack.  Then recheck cbc in 2 weeks.

## 2014-08-06 NOTE — Telephone Encounter (Signed)
Med sent to pharm 

## 2014-08-17 ENCOUNTER — Telehealth: Payer: Self-pay | Admitting: Family Medicine

## 2014-08-17 DIAGNOSIS — F909 Attention-deficit hyperactivity disorder, unspecified type: Secondary | ICD-10-CM

## 2014-08-17 NOTE — Telephone Encounter (Signed)
Patient is calling to get rx for her adderall if possible [phone]

## 2014-08-18 MED ORDER — AMPHETAMINE-DEXTROAMPHETAMINE 10 MG PO TABS: 10.0000 mg | ORAL_TABLET | Freq: Two times a day (BID) | ORAL | Status: DC

## 2014-08-18 NOTE — Telephone Encounter (Signed)
RX printed, left up front and patient aware to pick up  

## 2014-08-18 NOTE — Telephone Encounter (Signed)
Okay to refill? 

## 2014-08-18 NOTE — Telephone Encounter (Signed)
?   OK to Refill - Last refill 07/20/14

## 2014-08-20 ENCOUNTER — Other Ambulatory Visit

## 2014-08-20 ENCOUNTER — Encounter (INDEPENDENT_AMBULATORY_CARE_PROVIDER_SITE_OTHER): Admitting: Ophthalmology

## 2014-08-20 DIAGNOSIS — D72829 Elevated white blood cell count, unspecified: Secondary | ICD-10-CM

## 2014-08-20 DIAGNOSIS — H43813 Vitreous degeneration, bilateral: Secondary | ICD-10-CM

## 2014-08-20 DIAGNOSIS — H2513 Age-related nuclear cataract, bilateral: Secondary | ICD-10-CM

## 2014-08-20 LAB — CBC WITH DIFFERENTIAL/PLATELET
Basophils Absolute: 0.1 10*3/uL (ref 0.0–0.1)
Basophils Relative: 1 % (ref 0–1)
Eosinophils Absolute: 0.2 10*3/uL (ref 0.0–0.7)
Eosinophils Relative: 2 % (ref 0–5)
HCT: 39.8 % (ref 36.0–46.0)
Hemoglobin: 13.6 g/dL (ref 12.0–15.0)
Lymphocytes Relative: 30 % (ref 12–46)
Lymphs Abs: 2.6 10*3/uL (ref 0.7–4.0)
MCH: 32.8 pg (ref 26.0–34.0)
MCHC: 34.2 g/dL (ref 30.0–36.0)
MCV: 95.9 fL (ref 78.0–100.0)
MPV: 10.4 fL (ref 9.4–12.4)
Monocytes Absolute: 0.4 10*3/uL (ref 0.1–1.0)
Monocytes Relative: 5 % (ref 3–12)
Neutro Abs: 5.4 10*3/uL (ref 1.7–7.7)
Neutrophils Relative %: 62 % (ref 43–77)
Platelets: 529 10*3/uL — ABNORMAL HIGH (ref 150–400)
RBC: 4.15 MIL/uL (ref 3.87–5.11)
RDW: 13.7 % (ref 11.5–15.5)
WBC: 8.7 10*3/uL (ref 4.0–10.5)

## 2014-08-21 ENCOUNTER — Other Ambulatory Visit: Payer: Self-pay | Admitting: Family Medicine

## 2014-08-21 DIAGNOSIS — D72829 Elevated white blood cell count, unspecified: Secondary | ICD-10-CM

## 2014-09-09 ENCOUNTER — Telehealth: Payer: Self-pay | Admitting: Family Medicine

## 2014-09-09 ENCOUNTER — Other Ambulatory Visit

## 2014-09-09 DIAGNOSIS — F909 Attention-deficit hyperactivity disorder, unspecified type: Secondary | ICD-10-CM

## 2014-09-09 NOTE — Telephone Encounter (Signed)
Ok to refill??  Last office visit 07/20/2014.  Last refill 08/18/2014.

## 2014-09-09 NOTE — Telephone Encounter (Signed)
[phone]  Pt is needing a refill on amphetamine-dextroamphetamine (ADDERALL) 10 MG tablet (she knows she is early but she wanted to make sure to call it in because of the holiday's coming up)

## 2014-09-10 MED ORDER — AMPHETAMINE-DEXTROAMPHETAMINE 10 MG PO TABS: 10.0000 mg | ORAL_TABLET | Freq: Two times a day (BID) | ORAL | Status: DC

## 2014-09-10 NOTE — Telephone Encounter (Signed)
ok 

## 2014-09-10 NOTE — Telephone Encounter (Signed)
Prescription printed and patient made aware to come to office to pick up.  

## 2014-09-11 ENCOUNTER — Ambulatory Visit
Admission: RE | Admit: 2014-09-11 | Discharge: 2014-09-11 | Disposition: A | Discharge status: Home / Self Care | Source: Ambulatory Visit | Attending: Hematology and Oncology | Admitting: Hematology and Oncology

## 2014-09-11 DIAGNOSIS — C50412 Malignant neoplasm of upper-outer quadrant of left female breast: Secondary | ICD-10-CM

## 2014-09-14 ENCOUNTER — Other Ambulatory Visit

## 2014-09-14 ENCOUNTER — Other Ambulatory Visit: Payer: Self-pay | Admitting: Family Medicine

## 2014-09-14 DIAGNOSIS — D72829 Elevated white blood cell count, unspecified: Secondary | ICD-10-CM

## 2014-09-14 LAB — CBC WITH DIFFERENTIAL/PLATELET
Basophils Absolute: 0.2 10*3/uL — ABNORMAL HIGH (ref 0.0–0.1)
Basophils Relative: 2 % — ABNORMAL HIGH (ref 0–1)
Eosinophils Absolute: 0.3 10*3/uL (ref 0.0–0.7)
Eosinophils Relative: 4 % (ref 0–5)
HCT: 38.7 % (ref 36.0–46.0)
Hemoglobin: 13.2 g/dL (ref 12.0–15.0)
Lymphocytes Relative: 31 % (ref 12–46)
Lymphs Abs: 2.5 10*3/uL (ref 0.7–4.0)
MCH: 32.6 pg (ref 26.0–34.0)
MCHC: 34.1 g/dL (ref 30.0–36.0)
MCV: 95.6 fL (ref 78.0–100.0)
MPV: 9.9 fL (ref 9.4–12.4)
Monocytes Absolute: 0.6 10*3/uL (ref 0.1–1.0)
Monocytes Relative: 7 % (ref 3–12)
Neutro Abs: 4.5 10*3/uL (ref 1.7–7.7)
Neutrophils Relative %: 56 % (ref 43–77)
Platelets: 477 10*3/uL — ABNORMAL HIGH (ref 150–400)
RBC: 4.05 MIL/uL (ref 3.87–5.11)
RDW: 13.5 % (ref 11.5–15.5)
WBC: 8 10*3/uL (ref 4.0–10.5)

## 2014-09-16 ENCOUNTER — Encounter: Payer: Self-pay | Admitting: *Deleted

## 2014-09-24 ENCOUNTER — Encounter: Payer: Self-pay | Admitting: *Deleted

## 2014-09-24 NOTE — Progress Notes (Signed)
Received bone density report from breast center, sent to scan.

## 2014-09-30 ENCOUNTER — Other Ambulatory Visit: Payer: Self-pay | Admitting: Family Medicine

## 2014-09-30 DIAGNOSIS — Z0000 Encounter for general adult medical examination without abnormal findings: Secondary | ICD-10-CM

## 2014-09-30 MED ORDER — LEVOTHYROXINE SODIUM 75 MCG PO TABS: ORAL_TABLET | ORAL | Status: DC

## 2014-09-30 MED ORDER — ESOMEPRAZOLE MAGNESIUM 40 MG PO CPDR: DELAYED_RELEASE_CAPSULE | ORAL | Status: DC

## 2014-09-30 MED ORDER — ROSUVASTATIN CALCIUM 10 MG PO TABS: 10.0000 mg | ORAL_TABLET | Freq: Every day | ORAL | Status: DC

## 2014-09-30 MED ORDER — NAPROXEN 375 MG PO TABS: ORAL_TABLET | ORAL | Status: DC

## 2014-09-30 NOTE — Telephone Encounter (Signed)
Med sent to pharm 

## 2014-10-06 ENCOUNTER — Telehealth: Payer: Self-pay | Admitting: Family Medicine

## 2014-10-06 DIAGNOSIS — F909 Attention-deficit hyperactivity disorder, unspecified type: Secondary | ICD-10-CM

## 2014-10-06 MED ORDER — AMPHETAMINE-DEXTROAMPHETAMINE 10 MG PO TABS: 10.0000 mg | ORAL_TABLET | Freq: Two times a day (BID) | ORAL | Status: DC

## 2014-10-06 NOTE — Telephone Encounter (Signed)
rx printed

## 2014-10-06 NOTE — Telephone Encounter (Signed)
LRF 09/10/14 #60 !!  LOV 07/20/14     Too early??

## 2014-10-06 NOTE — Telephone Encounter (Signed)
[phone] PT is needing a refill on amphetamine-dextroamphetamine (ADDERALL) 10 MG tablet

## 2014-10-06 NOTE — Telephone Encounter (Signed)
ok 

## 2014-10-07 NOTE — Telephone Encounter (Signed)
Pt called and told Rx will be ready anytime tomorrow

## 2014-11-06 ENCOUNTER — Telehealth: Payer: Self-pay | Admitting: Family Medicine

## 2014-11-06 DIAGNOSIS — F909 Attention-deficit hyperactivity disorder, unspecified type: Secondary | ICD-10-CM

## 2014-11-06 MED ORDER — AMPHETAMINE-DEXTROAMPHETAMINE 10 MG PO TABS: 10.0000 mg | ORAL_TABLET | Freq: Two times a day (BID) | ORAL | Status: DC

## 2014-11-06 NOTE — Telephone Encounter (Signed)
[phone]  PT is needing a refill on amphetamine-dextroamphetamine (ADDERALL) 10 MG tablet

## 2014-11-06 NOTE — Telephone Encounter (Signed)
ok 

## 2014-11-06 NOTE — Telephone Encounter (Signed)
?   OK to Refill  

## 2014-11-06 NOTE — Telephone Encounter (Signed)
RX printed, left up front and patient aware to pick up via vm 

## 2014-11-19 ENCOUNTER — Ambulatory Visit: Payer: Self-pay

## 2014-11-19 ENCOUNTER — Ambulatory Visit (INDEPENDENT_AMBULATORY_CARE_PROVIDER_SITE_OTHER): Admitting: Podiatry

## 2014-11-19 ENCOUNTER — Encounter: Payer: Self-pay | Admitting: Podiatry

## 2014-11-19 VITALS — BP 138/87 | HR 81 | Resp 16 | Ht 65.5 in | Wt 128.0 lb

## 2014-11-19 DIAGNOSIS — M2041 Other hammer toe(s) (acquired), right foot: Secondary | ICD-10-CM

## 2014-11-19 DIAGNOSIS — L600 Ingrowing nail: Secondary | ICD-10-CM

## 2014-11-19 MED ORDER — NEOMYCIN-POLYMYXIN-HC 3.5-10000-1 OT SOLN: OTIC | Status: DC

## 2014-11-19 NOTE — Progress Notes (Signed)
   Subjective:    Patient ID: [name], female    DOB: January 12, 1956, 59 y.o.   MRN: [id number]  HPI Comments: Right #2  Toenail , thick painful , keep it trimmed back as best as possible      Review of Systems  All other systems reviewed and are negative.      Objective:   Physical Exam: I have reviewed her past mental history medications allergies surgery social history and review of systems. She is known to me is a previous patient. Pulses are palpable bilateral. Neurologic sensorium is intact versus once the monofilament. Deep tendon reflexes are intact bilateral muscle strength was 5 over 5 dorsiflexion plantar flexors and inverters everters all into the musculature is intact. Orthopedic evaluation demonstrates hammertoe deformities are noted bilateral. Cutaneous evaluation demonstrates supple well-hydrated cutis thick yellow dystrophic clinic for mycotic and painful nails and a solitary poor keratoma sub-fourth metatarsal head of the left foot. 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        Assessment & Plan:  Assessment: Ingrown nail second digit right foot.  Plan: Total nail avulsion second digit right foot temporary. This was performed after 3 mL of a 50-50 mixture of Marcaine plain and lidocaine plain was in checked it about the second digit right foot. The right toe was then prepped and draped in its normal sterile fashion and the nail was avulsed. Antibiotic solution and a dry sterile compressive dressing was applied. She was given both oral home-going instructions for the care of this foot and how to soak it. She is also given a prescription for Cortisporin Otic. I will follow-up with her in 1 week.

## 2014-11-19 NOTE — Patient Instructions (Signed)

## 2014-11-26 ENCOUNTER — Encounter: Payer: Self-pay | Admitting: Podiatry

## 2014-11-26 ENCOUNTER — Ambulatory Visit (INDEPENDENT_AMBULATORY_CARE_PROVIDER_SITE_OTHER): Admitting: Podiatry

## 2014-11-26 DIAGNOSIS — L608 Other nail disorders: Secondary | ICD-10-CM

## 2014-11-26 DIAGNOSIS — L600 Ingrowing nail: Secondary | ICD-10-CM

## 2014-11-26 DIAGNOSIS — L603 Nail dystrophy: Secondary | ICD-10-CM

## 2014-11-27 NOTE — Progress Notes (Signed)
She presents today for follow-up of nail avulsion right. She continues to soak twice daily. States this seems to be doing well. She is also concerned of thickening of other nails which she thinks may be fungal.  Objective: Vital signs are stable she is alert and oriented 3. Pulses are palpable bilateral. Surgical site appears to be healing well without signs or symptoms of infection. Nail plates do appear to be thickened and discolored and samples will be taken.  Assessment: Well healing surgical toe. Nail dystrophy. Rule out onychomycosis.  Plan: Discontinue Betadine in warm water soaks. Start with Epsom salts and warm water soaks twice daily covered during the day and leave open at night. Samples of the nail and skin were taken today does be sent for pathologic evaluation.

## 2014-11-30 ENCOUNTER — Other Ambulatory Visit: Payer: Self-pay | Admitting: Family Medicine

## 2014-11-30 DIAGNOSIS — F909 Attention-deficit hyperactivity disorder, unspecified type: Secondary | ICD-10-CM

## 2014-11-30 MED ORDER — AMPHETAMINE-DEXTROAMPHETAMINE 10 MG PO TABS: 10.0000 mg | ORAL_TABLET | Freq: Two times a day (BID) | ORAL | Status: DC

## 2014-11-30 NOTE — Telephone Encounter (Signed)
[phone]  PT is needing a refill on amphetamine-dextroamphetamine (ADDERALL) 10 MG tablet

## 2014-11-30 NOTE — Telephone Encounter (Signed)
Script printed ready for provider signature 

## 2014-11-30 NOTE — Telephone Encounter (Signed)
?   OK to Refill  

## 2014-11-30 NOTE — Telephone Encounter (Signed)
ok 

## 2014-12-16 ENCOUNTER — Encounter: Payer: Self-pay | Admitting: Podiatry

## 2014-12-29 ENCOUNTER — Other Ambulatory Visit: Payer: Self-pay | Admitting: Family Medicine

## 2014-12-29 DIAGNOSIS — F909 Attention-deficit hyperactivity disorder, unspecified type: Secondary | ICD-10-CM

## 2014-12-29 MED ORDER — AMPHETAMINE-DEXTROAMPHETAMINE 10 MG PO TABS: 10.0000 mg | ORAL_TABLET | Freq: Two times a day (BID) | ORAL | Status: DC

## 2014-12-29 NOTE — Telephone Encounter (Signed)
Script printed on providers desk will not return until Thursday can pick up then

## 2014-12-29 NOTE — Telephone Encounter (Signed)
ok 

## 2014-12-29 NOTE — Telephone Encounter (Signed)
?   OK to Refill  

## 2014-12-29 NOTE — Telephone Encounter (Signed)
Patient needs refill on adderall  [phone]

## 2014-12-31 ENCOUNTER — Telehealth: Payer: Self-pay | Admitting: Family Medicine

## 2014-12-31 NOTE — Telephone Encounter (Signed)
RX printed, left up front and patient aware to pick up  

## 2014-12-31 NOTE — Telephone Encounter (Signed)
Patient requesting refill on adderall  [phone]

## 2015-01-05 ENCOUNTER — Ambulatory Visit (INDEPENDENT_AMBULATORY_CARE_PROVIDER_SITE_OTHER): Admitting: Podiatry

## 2015-01-05 ENCOUNTER — Encounter: Payer: Self-pay | Admitting: Podiatry

## 2015-01-05 VITALS — BP 149/86 | HR 79 | Resp 11

## 2015-01-05 DIAGNOSIS — B351 Tinea unguium: Secondary | ICD-10-CM | POA: Diagnosis not present

## 2015-01-05 NOTE — Progress Notes (Signed)
She presents today for a follow-up of her fungal culture. It did come back positive for mold. She really only wants to utilize a topical over an oral medication.  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  Assessment: Onychomycosis.  Plan: At this point we discussed laser therapy in great detail since none of the topicals will kill this type of mold. She will follow up with me should she decide laser therapy as for her.

## 2015-01-15 ENCOUNTER — Other Ambulatory Visit: Payer: Self-pay | Admitting: Hematology and Oncology

## 2015-01-15 DIAGNOSIS — Z9889 Other specified postprocedural states: Secondary | ICD-10-CM

## 2015-01-15 DIAGNOSIS — Z853 Personal history of malignant neoplasm of breast: Secondary | ICD-10-CM

## 2015-01-25 ENCOUNTER — Telehealth: Payer: Self-pay | Admitting: Family Medicine

## 2015-01-25 DIAGNOSIS — F909 Attention-deficit hyperactivity disorder, unspecified type: Secondary | ICD-10-CM

## 2015-01-25 MED ORDER — AMPHETAMINE-DEXTROAMPHETAMINE 10 MG PO TABS: 10.0000 mg | ORAL_TABLET | Freq: Two times a day (BID) | ORAL | Status: DC

## 2015-01-25 NOTE — Telephone Encounter (Signed)
Patient calling to get rx for her adderall  [phone]

## 2015-01-25 NOTE — Telephone Encounter (Signed)
?   OK to Refill  

## 2015-01-25 NOTE — Telephone Encounter (Signed)
ok 

## 2015-01-25 NOTE — Telephone Encounter (Signed)
RX printed, left up front and patient aware to pick up  

## 2015-02-16 ENCOUNTER — Telehealth: Payer: Self-pay | Admitting: Family Medicine

## 2015-02-16 DIAGNOSIS — F909 Attention-deficit hyperactivity disorder, unspecified type: Secondary | ICD-10-CM

## 2015-02-16 NOTE — Telephone Encounter (Signed)
ok 

## 2015-02-16 NOTE — Telephone Encounter (Signed)
Patient calling for refill of adderall [phone]

## 2015-02-16 NOTE — Telephone Encounter (Signed)
?   OK to Refill  

## 2015-02-18 MED ORDER — AMPHETAMINE-DEXTROAMPHETAMINE 10 MG PO TABS: 10.0000 mg | ORAL_TABLET | Freq: Two times a day (BID) | ORAL | Status: DC

## 2015-02-18 NOTE — Telephone Encounter (Signed)
RX printed, left up front and patient aware to pick up via vm 

## 2015-03-05 ENCOUNTER — Ambulatory Visit
Admission: RE | Admit: 2015-03-05 | Discharge: 2015-03-05 | Disposition: A | Discharge status: Home / Self Care | Source: Ambulatory Visit | Attending: Hematology and Oncology | Admitting: Hematology and Oncology

## 2015-03-05 DIAGNOSIS — Z9889 Other specified postprocedural states: Secondary | ICD-10-CM

## 2015-03-05 DIAGNOSIS — Z853 Personal history of malignant neoplasm of breast: Secondary | ICD-10-CM

## 2015-03-17 ENCOUNTER — Telehealth: Payer: Self-pay | Admitting: Family Medicine

## 2015-03-17 DIAGNOSIS — F909 Attention-deficit hyperactivity disorder, unspecified type: Secondary | ICD-10-CM

## 2015-03-17 NOTE — Telephone Encounter (Signed)
[phone] Pt is needing a refill on amphetamine-dextroamphetamine (ADDERALL) 10 MG tablet

## 2015-03-17 NOTE — Telephone Encounter (Signed)
ok 

## 2015-03-17 NOTE — Telephone Encounter (Signed)
?   OK to Refill  

## 2015-03-18 MED ORDER — AMPHETAMINE-DEXTROAMPHETAMINE 10 MG PO TABS: 10.0000 mg | ORAL_TABLET | Freq: Two times a day (BID) | ORAL | Status: DC

## 2015-03-18 NOTE — Telephone Encounter (Signed)
RX printed, left up front and patient aware to pick up via vm 

## 2015-04-12 ENCOUNTER — Telehealth: Payer: Self-pay | Admitting: Family Medicine

## 2015-04-12 DIAGNOSIS — F909 Attention-deficit hyperactivity disorder, unspecified type: Secondary | ICD-10-CM

## 2015-04-12 NOTE — Telephone Encounter (Signed)
[phone] Pt is needing a refill on amphetamine-dextroamphetamine (ADDERALL) 10 MG tablet

## 2015-04-12 NOTE — Telephone Encounter (Signed)
?   OK to Refill  

## 2015-04-12 NOTE — Telephone Encounter (Signed)
ok 

## 2015-04-13 MED ORDER — AMPHETAMINE-DEXTROAMPHETAMINE 10 MG PO TABS: 10.0000 mg | ORAL_TABLET | Freq: Two times a day (BID) | ORAL | Status: DC

## 2015-04-13 NOTE — Telephone Encounter (Signed)
RX printed, left up front and patient aware to pick up  

## 2015-04-28 ENCOUNTER — Ambulatory Visit (INDEPENDENT_AMBULATORY_CARE_PROVIDER_SITE_OTHER): Admitting: Physician Assistant

## 2015-04-28 ENCOUNTER — Ambulatory Visit
Admission: RE | Admit: 2015-04-28 | Discharge: 2015-04-28 | Disposition: A | Discharge status: Home / Self Care | Source: Ambulatory Visit | Attending: Physician Assistant | Admitting: Physician Assistant

## 2015-04-28 ENCOUNTER — Encounter: Payer: Self-pay | Admitting: Physician Assistant

## 2015-04-28 VITALS — BP 110/80 | HR 80 | Temp 98.2°F | Resp 18 | Wt 123.5 lb

## 2015-04-28 DIAGNOSIS — M545 Low back pain, unspecified: Secondary | ICD-10-CM

## 2015-04-28 MED ORDER — METAXALONE 800 MG PO TABS: 800.0000 mg | ORAL_TABLET | Freq: Four times a day (QID) | ORAL | Status: DC

## 2015-04-28 MED ORDER — MELOXICAM 7.5 MG PO TABS: 7.5000 mg | ORAL_TABLET | Freq: Every day | ORAL | Status: DC

## 2015-04-28 NOTE — Progress Notes (Signed)
Patient ID: [name] MRN: [id number], DOB: 1955-11-05, 59 y.o. Date of Encounter: 04/28/2015, 10:06 AM    Chief Complaint:  Chief Complaint  Patient presents with  . Back Pain    X 3 weeks mostly left side above hip at waist and now radiates to the front     HPI: 59 y.o. year old white female presents with above.   She reports that she has no prior history of significant back pain. She says that she is always doing a lot of yard work. Says that with this yard work, frequently she will feel some discomfort in her back but it usually resolves. Says that she has been experiencing this pain in her left low back for 3 weeks and it has not gone away so she felt she should come have it evaluated. Says there is a constant achy discomfort in the left low back. Says that at times when she stands it will be a shooting pain towards the left groin. Never radiates down the leg. Never has any weakness or numbness or tingling down the leg. Says that when she sleeps she has to lay flat on her stomach. Cannot lie on the left side because this increases the pain. Says that she has used all of her Naprosyn. Also has been taking multiple ibuprofens every 4 hours. Occasional Tylenol.     Home Meds:   Outpatient Prescriptions Prior to Visit  Medication Sig Dispense Refill  . amphetamine-dextroamphetamine (ADDERALL) 10 MG tablet Take 1 tablet (10 mg total) by mouth 2 (two) times daily. 60 tablet 0  . B Complex-C (B-COMPLEX WITH VITAMIN C) tablet Take 1 tablet by mouth daily.     . cholecalciferol (VITAMIN D) 1000 UNITS tablet Take 1,000 Units by mouth daily.    Marland Kitchen esomeprazole (NEXIUM) 40 MG capsule 1 tab po bid 180 capsule 3  . levothyroxine (SYNTHROID, LEVOTHROID) 75 MCG tablet TAKE 1 TABLET (75 MCG TOTAL) BY MOUTH DAILY. 90 tablet 3  . Multiple Vitamin (MULTIVITAMIN) tablet Take 1 tablet by mouth daily.      . rosuvastatin (CRESTOR) 10 MG tablet Take 1 tablet (10 mg total) by mouth daily. 90  tablet 3  . naproxen (NAPROSYN) 375 MG tablet TAKE 1 TABLET (375 MG TOTAL) BY MOUTH 2 (TWO) TIMES DAILY WITH A MEAL. 180 tablet 3  . neomycin-polymyxin-hydrocortisone (CORTISPORIN) otic solution 1-2 drops to the toe after soaking twice daily (Patient not taking: Reported on 04/28/2015) 10 mL 1   No facility-administered medications prior to visit.    Allergies:  Allergies  Allergen Reactions  . Penicillins Hives      Review of Systems: See HPI for pertinent ROS. All other ROS negative.    Physical Exam: Blood pressure 110/80, pulse 80, temperature 98.2 F (36.8 C), temperature source Oral, resp. rate 18, weight 123 lb 8 oz (56.019 kg)., Body mass index is 20.23 kg/(m^2). General:  WNWD WF. Appears in no acute distress. Neck: Supple. No thyromegaly. No lymphadenopathy. Lungs: Clear bilaterally to auscultation without wheezes, rales, or rhonchi. Breathing is unlabored. Heart: Regular rhythm. No murmurs, rubs, or gallops. Msk:  Strength and tone normal for age. She points to low back on left side, at approximate L3-L5 level as area of pain.  Pain is not really reproduce with palpation. No tenderness with palpation of sciatic notch. Bilateral straight leg raise normal. Bilateral hip abduction normal. Patellar reflexes are intact and equal bilaterally. Extremities/Skin: Warm and dry. Neuro: Alert and oriented X 3. Moves  all extremities spontaneously. Gait is normal. CNII-XII grossly in tact. Psych:  Responds to questions appropriately with a normal affect.     ASSESSMENT AND PLAN:  59 y.o. year old female with  1. Left-sided low back pain without sciatica - DG Lumbar Spine Complete; Future - meloxicam (MOBIC) 7.5 MG tablet; Take 1 tablet (7.5 mg total) by mouth daily.  Dispense: 30 tablet; Refill: 0 - metaxalone (SKELAXIN) 800 MG tablet; Take 1 tablet (800 mg total) by mouth 4 (four) times daily.  Dispense: 30 tablet; Refill: 1  Will obtain x-ray. Will f/u with her once I get  those results. Prescribe Motrin 8 anti-inflammatory to use daily with food as needed. Cautioned that Skelaxin may cause drowsiness. If it does not cause drowsiness than take 4 times daily as needed otherwise only use at night bedtime if causes drowsiness. Also apply heat to the area using heating pad or warm water in the shower. Also discussed specific stretches to do to stretch the low back.  Signed, [address] [place], [place], [facility] 04/28/2015 10:06 AM

## 2015-05-10 ENCOUNTER — Telehealth: Payer: Self-pay | Admitting: Family Medicine

## 2015-05-10 DIAGNOSIS — F909 Attention-deficit hyperactivity disorder, unspecified type: Secondary | ICD-10-CM

## 2015-05-10 NOTE — Telephone Encounter (Signed)
Patient calling for rx on her adderall  [phone]

## 2015-05-10 NOTE — Telephone Encounter (Signed)
?   OK to Refill  

## 2015-05-10 NOTE — Telephone Encounter (Signed)
ok 

## 2015-05-11 MED ORDER — AMPHETAMINE-DEXTROAMPHETAMINE 10 MG PO TABS: 10.0000 mg | ORAL_TABLET | Freq: Two times a day (BID) | ORAL | Status: DC

## 2015-05-11 NOTE — Telephone Encounter (Signed)
RX printed, left up front and patient aware to pick up  

## 2015-05-25 ENCOUNTER — Other Ambulatory Visit: Payer: Self-pay | Admitting: Physician Assistant

## 2015-05-25 ENCOUNTER — Telehealth: Payer: Self-pay | Admitting: Family Medicine

## 2015-05-25 DIAGNOSIS — M545 Low back pain, unspecified: Secondary | ICD-10-CM

## 2015-05-25 DIAGNOSIS — R937 Abnormal findings on diagnostic imaging of other parts of musculoskeletal system: Secondary | ICD-10-CM

## 2015-05-25 NOTE — Telephone Encounter (Signed)
I do not see this message until 948 on Tuesday evening. I will defer this decision to Larena Glassman will be back in office on Thursday

## 2015-05-25 NOTE — Telephone Encounter (Signed)
Patient states she saw Olean Ree last for back pain. She states she has taken all the medication that she was prescribed muscle relaxer and another medication. She states that her back still aches like a pinched nerve and would like to know if an MRI/CT could be ordered. She states that she has had an xray already. Please advise.

## 2015-05-25 NOTE — Telephone Encounter (Signed)
Refill appropriate and filled per protocol. 

## 2015-05-27 NOTE — Telephone Encounter (Signed)
Notify pt and order MRI--Yes. Go ahead and order MRI Lumbar Spine.  Her XRay done 04/28/15 did show significant abnormalities.

## 2015-05-27 NOTE — Telephone Encounter (Signed)
MRI ordered

## 2015-05-27 NOTE — Telephone Encounter (Signed)
Pt is aware MRI to be ordered and the [facility] will be contacting her to schedule the appt d/t being MRI and they have a policy to call and screen.

## 2015-06-08 ENCOUNTER — Telehealth: Payer: Self-pay | Admitting: *Deleted

## 2015-06-08 DIAGNOSIS — F909 Attention-deficit hyperactivity disorder, unspecified type: Secondary | ICD-10-CM

## 2015-06-08 MED ORDER — AMPHETAMINE-DEXTROAMPHETAMINE 10 MG PO TABS: 10.0000 mg | ORAL_TABLET | Freq: Two times a day (BID) | ORAL | Status: DC

## 2015-06-08 NOTE — Telephone Encounter (Signed)
Script printed ready for provider signature 

## 2015-06-08 NOTE — Telephone Encounter (Signed)
Pt calling to get Rx on adderall  Please call [phone] when ready

## 2015-06-08 NOTE — Telephone Encounter (Signed)
ok 

## 2015-06-11 ENCOUNTER — Encounter: Payer: Self-pay | Admitting: Family

## 2015-06-11 ENCOUNTER — Other Ambulatory Visit

## 2015-06-14 ENCOUNTER — Ambulatory Visit (HOSPITAL_COMMUNITY)
Admission: RE | Admit: 2015-06-14 | Discharge: 2015-06-14 | Disposition: A | Discharge status: Home / Self Care | Source: Ambulatory Visit | Attending: Family | Admitting: Family

## 2015-06-14 ENCOUNTER — Ambulatory Visit (INDEPENDENT_AMBULATORY_CARE_PROVIDER_SITE_OTHER): Admitting: Family

## 2015-06-14 ENCOUNTER — Encounter: Payer: Self-pay | Admitting: Family

## 2015-06-14 VITALS — BP 138/86 | HR 77 | Temp 97.2°F | Resp 14 | Ht 65.5 in | Wt 121.0 lb

## 2015-06-14 DIAGNOSIS — I6523 Occlusion and stenosis of bilateral carotid arteries: Secondary | ICD-10-CM | POA: Insufficient documentation

## 2015-06-14 DIAGNOSIS — I773 Arterial fibromuscular dysplasia: Secondary | ICD-10-CM

## 2015-06-14 DIAGNOSIS — F17200 Nicotine dependence, unspecified, uncomplicated: Secondary | ICD-10-CM

## 2015-06-14 DIAGNOSIS — Z720 Tobacco use: Secondary | ICD-10-CM | POA: Diagnosis not present

## 2015-06-14 NOTE — Progress Notes (Signed)
Established Carotid Patient   History of Present Illness  [name] is a 59 y.o. female patient of Dr. 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 About 2009 she had a syncopal episode, was evaluated at [facility], and states that evaluation was inconclusive for TIA or stroke. She denies headaches, denies postprandial abdominal pain, does not have hypertension.   Pt denies claudication symptoms with walking. Patient has not had previous carotid artery intervention.  Pt reports New Medical or Surgical History: low back and right hip to knee pain since July 2016, will have an MRI to evaluate this.  Pt Diabetic: No Pt smoker: smoker (1 ppd, started at age 45 yrs)  Pt meds include: Statin: yes ASA: Yes Other anticoagulants/antiplatelets: no    Past Medical History  Diagnosis Date  . 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 ADHD (attention deficit hyperactivity disorder) 09/07/2011  . DVT (deep venous thrombosis) 09/07/2011  . Hypothyroid 09/07/2011  . Coronary artery disease   . Smoker 02/17/2013  . Carotid artery occlusion     Social History Social History  Substance Use Topics  . Smoking status: Light Tobacco Smoker    Types: E-cigarettes  . Smokeless tobacco: Never Used  . Alcohol Use: No    Family History Family History  Problem Relation Age of Onset  . Cancer Father     Stomach  . Crohn's disease Daughter     Surgical History Past Surgical History  Procedure Laterality Date  . Breast lumpectomy  March 2013    Left breast  . Eye surgery Right Sept. 2014    Lasix - Laser    Allergies  Allergen Reactions  . Penicillins Hives    Current Outpatient Prescriptions  Medication Sig Dispense Refill  . amphetamine-dextroamphetamine (ADDERALL) 10 MG tablet Take 1 tablet (10 mg total) by mouth 2 (two) times daily. 60 tablet 0  . B Complex-C (B-COMPLEX WITH VITAMIN C) tablet Take 1 tablet  by mouth daily.     . cholecalciferol (VITAMIN D) 1000 UNITS tablet Take 1,000 Units by mouth daily.    Marland Kitchen esomeprazole (NEXIUM) 40 MG capsule 1 tab po bid 180 capsule 3  . levothyroxine (SYNTHROID, LEVOTHROID) 75 MCG tablet TAKE 1 TABLET (75 MCG TOTAL) BY MOUTH DAILY. 90 tablet 3  . meloxicam (MOBIC) 7.5 MG tablet TAKE 1 TABLET (7.5 MG TOTAL) BY MOUTH DAILY. 30 tablet 3  . metaxalone (SKELAXIN) 800 MG tablet Take 1 tablet (800 mg total) by mouth 4 (four) times daily. 30 tablet 1  . Multiple Vitamin (MULTIVITAMIN) tablet Take 1 tablet by mouth daily.      . rosuvastatin (CRESTOR) 10 MG tablet Take 1 tablet (10 mg total) by mouth daily. 90 tablet 3   No current facility-administered medications for this visit.    Review of Systems : See HPI for pertinent positives and negatives.  Physical Examination  Filed Vitals:   06/14/15 0917 06/14/15 0919  BP: 138/82 138/86  Pulse: 72 77  Temp: 97.2 F (36.2 C)   Resp: 14   Height: 5' 5.5" (1.664 m)   Weight: 121 lb (54.885 kg)   SpO2: 99%    Body mass index is 19.82 kg/(m^2).  General: WDWN female in NAD GAIT: normal Eyes: PERRLA Pulmonary: Non-labored, CTAB, Negative Rales, Negative rhonchi, & Negative wheezing.  Cardiac: regular Rhythm , Negative detected murmur.  VASCULAR EXAM Carotid Bruits Right Left   Negative Negative   Aorta  is not palpable. Radial pulses are 2+ palpable and equal.      LE Pulses Right Left   POPLITEAL not palpable  1+ palpable   POSTERIOR TIBIAL 2+ palpable  2+ palpable    DORSALIS PEDIS  ANTERIOR TIBIAL not palpable  2+ palpable     Gastrointestinal: soft, nontender, BS WNL, no r/g, negative masses.  Musculoskeletal: Negative muscle atrophy/wasting. M/S 5/5 throughout, Extremities without ischemic changes.  Neurologic: A&O  X 3; Appropriate Affect, Speech is normal CN 2-12 intact, Pain and light touch intact in extremities, Motor exam as listed above.         Non-Invasive Vascular Imaging CAROTID DUPLEX 06/14/2015   CEREBROVASCULAR DUPLEX EVALUATION    INDICATION: Carotid artery disease    PREVIOUS INTERVENTION(S): None    DUPLEX EXAM: Carotid duplex    RIGHT  LEFT  Peak Systolic Velocities (cm/s) End Diastolic Velocities (cm/s) Plaque LOCATION Peak Systolic Velocities (cm/s) End Diastolic Velocities (cm/s) Plaque  100 20 - CCA PROXIMAL 127 27 -  79 20 - CCA MID 115 31 -  72 15 - CCA DISTAL 70 18 -  61 13 - ECA 66 11 -  60 21 HT ICA PROXIMAL 79 27 -  128 52 - ICA MID 82 31 -  130 48 - ICA DISTAL 121 43 -    1.6 ICA / CCA Ratio (PSV) 1.0  Antegrade Vertebral Flow Antegrade  - Brachial Systolic Pressure (mmHg) -  Triphasic Brachial Artery Waveforms Triphasic    Plaque Morphology:  HM = Homogeneous, HT = Heterogeneous, CP = Calcific Plaque, SP = Smooth Plaque, IP = Irregular Plaque     ADDITIONAL FINDINGS: Right subclavian 106 cm/s, left subclavian 149 cm/s, with triphasic waveforms bilaterally    IMPRESSION: 1. Patent bilateral internal carotid arteries with elevated velocity at the mid to distal segments suggestive of 40 - 59% stenosis, lower end of range. No plaque or tortuosity visualized, This may be suggestive of fibromuscular dysplasia.    Compared to the previous exam:  No significant change      Assessment: [name] is a 59 y.o. female who has bilateral internal carotid artery stenosis that remains consistent with fibromuscular dysplasia. Her blood pressure remains in the normal range. She has no history of stroke or TIA. Today's carotid Duplex suggests patent bilateral internal carotid arteries with elevated velocity at the mid to distal segments suggestive of 40 - 59% stenosis, lower end of range. No plaque or tortuosity visualized, This may be suggestive of fibromuscular  dysplasia. No significant change from a year ago.  Fortunately she does not have DM, but unfortunately she continues to smoke, has a history of breast cancer, see Plan.   Plan:  The patient was counseled re smoking cessation and given several free resources re smoking cessation.  The patient was given printed information from the [facility] re fibromuscular dysplasia.  Follow-up in 1 year with Carotid Duplex.   I discussed in depth with the patient the nature of atherosclerosis, and emphasized the importance of maximal medical management including strict control of blood pressure, blood glucose, and lipid levels, obtaining regular exercise, and cessation of smoking.  The patient is aware that without maximal medical management the underlying atherosclerotic disease process will progress, limiting the benefit of any interventions. The patient was given information about stroke prevention and what symptoms should prompt the patient to seek immediate medical care. Thank you for allowing [place] to participate in this patient's care.  Vinnie Level Nickel, RN,  MSN, FNP-C [facility] Office: [place]: Trula Slade  06/14/2015 9:21 AM

## 2015-06-14 NOTE — Patient Instructions (Signed)
Stroke Prevention Some medical conditions and behaviors are associated with an increased chance of having a stroke. You may prevent a stroke by making healthy choices and managing medical conditions. HOW CAN I REDUCE MY RISK OF HAVING A STROKE?   Stay physically active. Get at least 30 minutes of activity on most or all days.  Do not smoke. It may also be helpful to avoid exposure to secondhand smoke.  Limit alcohol use. Moderate alcohol use is considered to be:  No more than 2 drinks per day for men.  No more than 1 drink per day for nonpregnant women.  Eat healthy foods. This involves:  Eating 5 or more servings of fruits and vegetables a day.  Making dietary changes that address high blood pressure (hypertension), high cholesterol, diabetes, or obesity.  Manage your cholesterol levels.  Making food choices that are high in fiber and low in saturated fat, trans fat, and cholesterol may control cholesterol levels.  Take any prescribed medicines to control cholesterol as directed by your health care provider.  Manage your diabetes.  Controlling your carbohydrate and sugar intake is recommended to manage diabetes.  Take any prescribed medicines to control diabetes as directed by your health care provider.  Control your hypertension.  Making food choices that are low in salt (sodium), saturated fat, trans fat, and cholesterol is recommended to manage hypertension.  Take any prescribed medicines to control hypertension as directed by your health care provider.  Maintain a healthy weight.  Reducing calorie intake and making food choices that are low in sodium, saturated fat, trans fat, and cholesterol are recommended to manage weight.  Stop drug abuse.  Avoid taking birth control pills.  Talk to your health care provider about the risks of taking birth control pills if you are over 35 years old, smoke, get migraines, or have ever had a blood clot.  Get evaluated for sleep  disorders (sleep apnea).  Talk to your health care provider about getting a sleep evaluation if you snore a lot or have excessive sleepiness.  Take medicines only as directed by your health care provider.  For some people, aspirin or blood thinners (anticoagulants) are helpful in reducing the risk of forming abnormal blood clots that can lead to stroke. If you have the irregular heart rhythm of atrial fibrillation, you should be on a blood thinner unless there is a good reason you cannot take them.  Understand all your medicine instructions.  Make sure that other conditions (such as anemia or atherosclerosis) are addressed. SEEK IMMEDIATE MEDICAL CARE IF:   You have sudden weakness or numbness of the face, arm, or leg, especially on one side of the body.  Your face or eyelid droops to one side.  You have sudden confusion.  You have trouble speaking (aphasia) or understanding.  You have sudden trouble seeing in one or both eyes.  You have sudden trouble walking.  You have dizziness.  You have a loss of balance or coordination.  You have a sudden, severe headache with no known cause.  You have new chest pain or an irregular heartbeat. Any of these symptoms may represent a serious problem that is an emergency. Do not wait to see if the symptoms will go away. Get medical help at once. Call your local emergency services (911 in [place] Do not drive yourself to the hospital. Document Released: 10/26/2004 Document Revised: 02/02/2014 Document Reviewed: 03/21/2013 ExitCare Patient Information 2015 [employer]. This information is not intended to replace advice given   to you by your health care provider. Make sure you discuss any questions you have with your health care provider.    Smoking Cessation Quitting smoking is important to your health and has many advantages. However, it is not always easy to quit since nicotine is a very addictive drug. Oftentimes, people try 3 times or  more before being able to quit. This document explains the best ways for you to prepare to quit smoking. Quitting takes hard work and a lot of effort, but you can do it. ADVANTAGES OF QUITTING SMOKING  You will live longer, feel better, and live better.  Your body will feel the impact of quitting smoking almost immediately.  Within 20 minutes, blood pressure decreases. Your pulse returns to its normal level.  After 8 hours, carbon monoxide levels in the blood return to normal. Your oxygen level increases.  After 24 hours, the chance of having a heart attack starts to decrease. Your breath, hair, and body stop smelling like smoke.  After 48 hours, damaged nerve endings begin to recover. Your sense of taste and smell improve.  After 72 hours, the body is virtually free of nicotine. Your bronchial tubes relax and breathing becomes easier.  After 2 to 12 weeks, lungs can hold more air. Exercise becomes easier and circulation improves.  The risk of having a heart attack, stroke, cancer, or lung disease is greatly reduced.  After 1 year, the risk of coronary heart disease is cut in half.  After 5 years, the risk of stroke falls to the same as a nonsmoker.  After 10 years, the risk of lung cancer is cut in half and the risk of other cancers decreases significantly.  After 15 years, the risk of coronary heart disease drops, usually to the level of a nonsmoker.  If you are pregnant, quitting smoking will improve your chances of having a healthy baby.  The people you live with, especially any children, will be healthier.  You will have extra money to spend on things other than cigarettes. QUESTIONS TO THINK ABOUT BEFORE ATTEMPTING TO QUIT You may want to talk about your answers with your health care provider.  Why do you want to quit?  If you tried to quit in the past, what helped and what did not?  What will be the most difficult situations for you after you quit? How will you plan to  handle them?  Who can help you through the tough times? Your family? Friends? A health care provider?  What pleasures do you get from smoking? What ways can you still get pleasure if you quit? Here are some questions to ask your health care provider:  How can you help me to be successful at quitting?  What medicine do you think would be best for me and how should I take it?  What should I do if I need more help?  What is smoking withdrawal like? How can I get information on withdrawal? GET READY  Set a quit date.  Change your environment by getting rid of all cigarettes, ashtrays, matches, and lighters in your home, car, or work. Do not let people smoke in your home.  Review your past attempts to quit. Think about what worked and what did not. GET SUPPORT AND ENCOURAGEMENT You have a better chance of being successful if you have help. You can get support in many ways.  Tell your family, friends, and coworkers that you are going to quit and need their support. Ask them   not to smoke around you.  Get individual, group, or telephone counseling and support. Programs are available at local hospitals and health centers. Call your local health department for information about programs in your area.  Spiritual beliefs and practices may help some smokers quit.  Download a "quit meter" on your computer to keep track of quit statistics, such as how long you have gone without smoking, cigarettes not smoked, and money saved.  Get a self-help book about quitting smoking and staying off tobacco. LEARN NEW SKILLS AND BEHAVIORS  Distract yourself from urges to smoke. Talk to someone, go for a walk, or occupy your time with a task.  Change your normal routine. Take a different route to work. Drink tea instead of coffee. Eat breakfast in a different place.  Reduce your stress. Take a hot bath, exercise, or read a book.  Plan something enjoyable to do every day. Reward yourself for not  smoking.  Explore interactive web-based programs that specialize in helping you quit. GET MEDICINE AND USE IT CORRECTLY Medicines can help you stop smoking and decrease the urge to smoke. Combining medicine with the above behavioral methods and support can greatly increase your chances of successfully quitting smoking.  Nicotine replacement therapy helps deliver nicotine to your body without the negative effects and risks of smoking. Nicotine replacement therapy includes nicotine gum, lozenges, inhalers, nasal sprays, and skin patches. Some may be available over-the-counter and others require a prescription.  Antidepressant medicine helps people abstain from smoking, but how this works is unknown. This medicine is available by prescription.  Nicotinic receptor partial agonist medicine simulates the effect of nicotine in your brain. This medicine is available by prescription. Ask your health care provider for advice about which medicines to use and how to use them based on your health history. Your health care provider will tell you what side effects to look out for if you choose to be on a medicine or therapy. Carefully read the information on the package. Do not use any other product containing nicotine while using a nicotine replacement product.  RELAPSE OR DIFFICULT SITUATIONS Most relapses occur within the first 3 months after quitting. Do not be discouraged if you start smoking again. Remember, most people try several times before finally quitting. You may have symptoms of withdrawal because your body is used to nicotine. You may crave cigarettes, be irritable, feel very hungry, cough often, get headaches, or have difficulty concentrating. The withdrawal symptoms are only temporary. They are strongest when you first quit, but they will go away within 10-14 days. To reduce the chances of relapse, try to:  Avoid drinking alcohol. Drinking lowers your chances of successfully quitting.  Reduce the  amount of caffeine you consume. Once you quit smoking, the amount of caffeine in your body increases and can give you symptoms, such as a rapid heartbeat, sweating, and anxiety.  Avoid smokers because they can make you want to smoke.  Do not let weight gain distract you. Many smokers will gain weight when they quit, usually less than 10 pounds. Eat a healthy diet and stay active. You can always lose the weight gained after you quit.  Find ways to improve your mood other than smoking. FOR MORE INFORMATION  www.smokefree.gov  Document Released: 09/12/2001 Document Revised: 02/02/2014 Document Reviewed: 12/28/2011 ExitCare Patient Information 2015 [employer]. This information is not intended to replace advice given to you by your health care provider. Make sure you discuss any questions you have with your health   care provider.    Smoking Cessation, Tips for Success If you are ready to quit smoking, congratulations! You have chosen to help yourself be healthier. Cigarettes bring nicotine, tar, carbon monoxide, and other irritants into your body. Your lungs, heart, and blood vessels will be able to work better without these poisons. There are many different ways to quit smoking. Nicotine gum, nicotine patches, a nicotine inhaler, or nicotine nasal spray can help with physical craving. Hypnosis, support groups, and medicines help break the habit of smoking. WHAT THINGS CAN I DO TO MAKE QUITTING EASIER?  Here are some tips to help you quit for good:  Pick a date when you will quit smoking completely. Tell all of your friends and family about your plan to quit on that date.  Do not try to slowly cut down on the number of cigarettes you are smoking. Pick a quit date and quit smoking completely starting on that day.  Throw away all cigarettes.   Clean and remove all ashtrays from your home, work, and car.  On a card, write down your reasons for quitting. Carry the card with you and read it  when you get the urge to smoke.  Cleanse your body of nicotine. Drink enough water and fluids to keep your urine clear or pale yellow. Do this after quitting to flush the nicotine from your body.  Learn to predict your moods. Do not let a bad situation be your excuse to have a cigarette. Some situations in your life might tempt you into wanting a cigarette.  Never have "just one" cigarette. It leads to wanting another and another. Remind yourself of your decision to quit.  Change habits associated with smoking. If you smoked while driving or when feeling stressed, try other activities to replace smoking. Stand up when drinking your coffee. Brush your teeth after eating. Sit in a different chair when you read the paper. Avoid alcohol while trying to quit, and try to drink fewer caffeinated beverages. Alcohol and caffeine may urge you to smoke.  Avoid foods and drinks that can trigger a desire to smoke, such as sugary or spicy foods and alcohol.  Ask people who smoke not to smoke around you.  Have something planned to do right after eating or having a cup of coffee. For example, plan to take a walk or exercise.  Try a relaxation exercise to calm you down and decrease your stress. Remember, you may be tense and nervous for the first 2 weeks after you quit, but this will pass.  Find new activities to keep your hands busy. Play with a pen, coin, or rubber band. Doodle or draw things on paper.  Brush your teeth right after eating. This will help cut down on the craving for the taste of tobacco after meals. You can also try mouthwash.   Use oral substitutes in place of cigarettes. Try using lemon drops, carrots, cinnamon sticks, or chewing gum. Keep them handy so they are available when you have the urge to smoke.  When you have the urge to smoke, try deep breathing.  Designate your home as a nonsmoking area.  If you are a heavy smoker, ask your health care provider about a prescription for  nicotine chewing gum. It can ease your withdrawal from nicotine.  Reward yourself. Set aside the cigarette money you save and buy yourself something nice.  Look for support from others. Join a support group or smoking cessation program. Ask someone at home or at work   to help you with your plan to quit smoking.  Always ask yourself, "Do I need this cigarette or is this just a reflex?" Tell yourself, "Today, I choose not to smoke," or "I do not want to smoke." You are reminding yourself of your decision to quit.  Do not replace cigarette smoking with electronic cigarettes (commonly called e-cigarettes). The safety of e-cigarettes is unknown, and some may contain harmful chemicals.  If you relapse, do not give up! Plan ahead and think about what you will do the next time you get the urge to smoke. HOW WILL I FEEL WHEN I QUIT SMOKING? You may have symptoms of withdrawal because your body is used to nicotine (the addictive substance in cigarettes). You may crave cigarettes, be irritable, feel very hungry, cough often, get headaches, or have difficulty concentrating. The withdrawal symptoms are only temporary. They are strongest when you first quit but will go away within 10-14 days. When withdrawal symptoms occur, stay in control. Think about your reasons for quitting. Remind yourself that these are signs that your body is healing and getting used to being without cigarettes. Remember that withdrawal symptoms are easier to treat than the major diseases that smoking can cause.  Even after the withdrawal is over, expect periodic urges to smoke. However, these cravings are generally short lived and will go away whether you smoke or not. Do not smoke! WHAT RESOURCES ARE AVAILABLE TO HELP ME QUIT SMOKING? Your health care provider can direct you to community resources or hospitals for support, which may include:  Group support.  Education.  Hypnosis.  Therapy. Document Released: 06/16/2004 Document  Revised: 02/02/2014 Document Reviewed: 03/06/2013 ExitCare Patient Information 2015 ExitCare[employer]. This information is not intended to replace advice given to you by your health care provider. Make sure you discuss any questions you have with your health care provider.  

## 2015-06-15 ENCOUNTER — Ambulatory Visit
Admission: RE | Admit: 2015-06-15 | Discharge: 2015-06-15 | Disposition: A | Discharge status: Home / Self Care | Source: Ambulatory Visit | Attending: Physician Assistant | Admitting: Physician Assistant

## 2015-06-15 DIAGNOSIS — R937 Abnormal findings on diagnostic imaging of other parts of musculoskeletal system: Secondary | ICD-10-CM

## 2015-06-15 DIAGNOSIS — M545 Low back pain, unspecified: Secondary | ICD-10-CM

## 2015-06-15 NOTE — Addendum Note (Signed)
Addended by: Dorthula Rue L on: 06/15/2015 02:38 PM   Modules accepted: Orders

## 2015-06-18 ENCOUNTER — Other Ambulatory Visit: Payer: Self-pay | Admitting: Family Medicine

## 2015-06-18 DIAGNOSIS — R937 Abnormal findings on diagnostic imaging of other parts of musculoskeletal system: Secondary | ICD-10-CM

## 2015-06-18 DIAGNOSIS — M5441 Lumbago with sciatica, right side: Secondary | ICD-10-CM

## 2015-07-05 ENCOUNTER — Telehealth: Payer: Self-pay | Admitting: Family Medicine

## 2015-07-05 DIAGNOSIS — F909 Attention-deficit hyperactivity disorder, unspecified type: Secondary | ICD-10-CM

## 2015-07-05 MED ORDER — AMPHETAMINE-DEXTROAMPHETAMINE 10 MG PO TABS: 10.0000 mg | ORAL_TABLET | Freq: Two times a day (BID) | ORAL | Status: DC

## 2015-07-05 NOTE — Telephone Encounter (Signed)
?   OK to Refill  

## 2015-07-05 NOTE — Telephone Encounter (Signed)
ok 

## 2015-07-05 NOTE — Telephone Encounter (Signed)
PATIENT CALLING TO GET RX FOR HER ADDERALL  PLEASE CALL [phone] WHEN READY

## 2015-07-05 NOTE — Telephone Encounter (Signed)
RX printed, left up front and patient aware to pick up  

## 2015-07-29 ENCOUNTER — Telehealth: Payer: Self-pay | Admitting: *Deleted

## 2015-07-29 DIAGNOSIS — F909 Attention-deficit hyperactivity disorder, unspecified type: Secondary | ICD-10-CM

## 2015-07-29 MED ORDER — AMPHETAMINE-DEXTROAMPHETAMINE 10 MG PO TABS: 10.0000 mg | ORAL_TABLET | Freq: Two times a day (BID) | ORAL | Status: DC

## 2015-07-29 NOTE — Telephone Encounter (Signed)
RX printed, left up front and patient aware to pick up via vm and pt aware via vm to make appt

## 2015-07-29 NOTE — Telephone Encounter (Signed)
1 month supply, ntbs, last ov 1 year ago.

## 2015-07-29 NOTE — Telephone Encounter (Signed)
Pt called needs refill on her adderall, states would like to get in 3 month supply  Call back [place]

## 2015-08-06 ENCOUNTER — Ambulatory Visit (HOSPITAL_BASED_OUTPATIENT_CLINIC_OR_DEPARTMENT_OTHER): Admitting: Hematology and Oncology

## 2015-08-06 ENCOUNTER — Other Ambulatory Visit: Payer: Self-pay | Admitting: Family Medicine

## 2015-08-06 ENCOUNTER — Encounter: Payer: Self-pay | Admitting: Hematology and Oncology

## 2015-08-06 ENCOUNTER — Telehealth: Payer: Self-pay | Admitting: Hematology and Oncology

## 2015-08-06 ENCOUNTER — Encounter: Payer: Self-pay | Admitting: Family Medicine

## 2015-08-06 VITALS — BP 152/84 | HR 80 | Temp 97.7°F | Resp 18 | Ht 65.5 in | Wt 122.6 lb

## 2015-08-06 DIAGNOSIS — Z853 Personal history of malignant neoplasm of breast: Secondary | ICD-10-CM | POA: Diagnosis not present

## 2015-08-06 DIAGNOSIS — C50412 Malignant neoplasm of upper-outer quadrant of left female breast: Secondary | ICD-10-CM

## 2015-08-06 NOTE — Telephone Encounter (Signed)
Gave and pritned appt sched and avs for pt for NOV 2017

## 2015-08-06 NOTE — Telephone Encounter (Signed)
Medication refill for one time only.  Patient needs to be seen.  Letter sent for patient to call and schedule 

## 2015-08-06 NOTE — Addendum Note (Signed)
Addended by: Prentiss Bells on: 08/06/2015 05:45 PM   Modules accepted: Medications

## 2015-08-06 NOTE — Assessment & Plan Note (Signed)
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 Mammogram 03/05/2015 normal breast density category B. Back pain: MRI lumbar spine showed abnormalities related to disks and spurs between L2-S1  Survivorship: Encouraged her to do physical exercise activity and to decrease red meat consumption and increase more fruits and vegetables  Visual symptoms:  Return to clinic in 1 year with survivorship clinic

## 2015-08-06 NOTE — Progress Notes (Signed)
Patient Care Team: [name], MD as PCP - General (Family Medicine)  DIAGNOSIS: No matching staging information was found for the patient.  SUMMARY OF ONCOLOGIC HISTORY:   Breast cancer of upper-outer quadrant of left female breast ([place])   01/30/2007 Surgery Left breast lumpectomy: High-grade DCIS with focal necrosis 1.7 cm EF 54%, PR 74%   03/26/2007 - 05/13/2007 Radiation Therapy Adjuvant radiation therapy   06/05/2007 - 06/05/2012 Anti-estrogen oral therapy Arimidex 1 mg daily    CHIEF COMPLIANT: Surveillance of DCIS  INTERVAL HISTORY: [name] is a 59 year old lady with above-mentioned history of left breast ACR has treated with lumpectomy radiation and has 5 years of Arimidex completed in September 2013. She is here today for annual follow-up and reports no major problems from the breast. She's had back issues and had MRI in the back.  REVIEW OF SYSTEMS:   Constitutional: Denies fevers, chills or abnormal weight loss Eyes: Denies blurriness of vision Ears, nose, mouth, throat, and face: Denies mucositis or sore throat Respiratory: Denies cough, dyspnea or wheezes Cardiovascular: Denies palpitation, chest discomfort or lower extremity swelling Gastrointestinal:  Denies nausea, heartburn or change in bowel habits Skin: Denies abnormal skin rashes Lymphatics: Denies new lymphadenopathy or easy bruising Neurological:Denies numbness, tingling or new weaknesses Behavioral/Psych: Mood is stable, no new changes  Breast:  denies any pain or lumps or nodules in either breasts All other systems were reviewed with the patient and are negative.  I have reviewed the past medical history, past surgical history, social history and family history with the patient and they are unchanged from previous note.  ALLERGIES:  is allergic to penicillins.  MEDICATIONS:  Current Outpatient Prescriptions  Medication Sig Dispense Refill  . amphetamine-dextroamphetamine (ADDERALL) 10 MG tablet  Take 1 tablet (10 mg total) by mouth 2 (two) times daily. 60 tablet 0  . B Complex-C (B-COMPLEX WITH VITAMIN C) tablet Take 1 tablet by mouth daily.     . cholecalciferol (VITAMIN D) 1000 UNITS tablet Take 1,000 Units by mouth daily.    Marland Kitchen esomeprazole (NEXIUM) 40 MG capsule 1 tab po bid 180 capsule 3  . levothyroxine (SYNTHROID, LEVOTHROID) 75 MCG tablet TAKE 1 TABLET (75 MCG TOTAL) BY MOUTH DAILY. 90 tablet 3  . meloxicam (MOBIC) 7.5 MG tablet TAKE 1 TABLET (7.5 MG TOTAL) BY MOUTH DAILY. (Patient not taking: Reported on 06/14/2015) 30 tablet 3  . metaxalone (SKELAXIN) 800 MG tablet Take 1 tablet (800 mg total) by mouth 4 (four) times daily. (Patient not taking: Reported on 06/14/2015) 30 tablet 1  . Multiple Vitamin (MULTIVITAMIN) tablet Take 1 tablet by mouth daily.      . rosuvastatin (CRESTOR) 10 MG tablet Take 1 tablet (10 mg total) by mouth daily. (Patient taking differently: Take 10 mg by mouth daily. Per patient  She is taking one tablet every other day) 90 tablet 3   No current facility-administered medications for this visit.    PHYSICAL EXAMINATION: ECOG PERFORMANCE STATUS: 1 - Symptomatic but completely ambulatory  Filed Vitals:   08/06/15 0832  BP: 152/84  Pulse: 80  Temp: 97.7 F (36.5 C)  Resp: 18   Filed Weights   08/06/15 0832  Weight: 122 lb 9.6 oz (55.611 kg)    GENERAL:alert, no distress and comfortable SKIN: skin color, texture, turgor are normal, no rashes or significant lesions EYES: normal, Conjunctiva are pink and non-injected, sclera clear OROPHARYNX:no exudate, no erythema and lips, buccal mucosa, and tongue normal  NECK: supple, thyroid normal  size, non-tender, without nodularity LYMPH:  no palpable lymphadenopathy in the cervical, axillary or inguinal LUNGS: clear to auscultation and percussion with normal breathing effort HEART: regular rate & rhythm and no murmurs and no lower extremity edema ABDOMEN:abdomen soft, non-tender and normal bowel  sounds Musculoskeletal:no cyanosis of digits and no clubbing  NEURO: alert & oriented x 3 with fluent speech, no focal motor/sensory deficits BREAST: No palpable masses or nodules in either right or left breasts. No palpable axillary supraclavicular or infraclavicular adenopathy no breast tenderness or nipple discharge. (exam performed in the presence of a chaperone)  LABORATORY DATA:  I have reviewed the data as listed   Chemistry      Component Value Date/Time   NA 137 08/04/2014 0826   NA 134* 06/30/2014 1141   K 4.4 08/04/2014 0826   K 4.9 06/30/2014 1141   CL 99 06/30/2014 1141   CL 105 07/03/2012 1416   CO2 23 08/04/2014 0826   CO2 23 06/30/2014 1141   BUN 10.1 08/04/2014 0826   BUN 12 06/30/2014 1141   CREATININE 0.8 08/04/2014 0826   CREATININE 0.64 06/30/2014 1141   CREATININE 0.64 03/07/2012 1206      Component Value Date/Time   CALCIUM 9.4 08/04/2014 0826   CALCIUM 9.7 06/30/2014 1141   ALKPHOS 88 08/04/2014 0826   ALKPHOS 84 06/30/2014 1141   AST 24 08/04/2014 0826   AST 21 06/30/2014 1141   ALT 26 08/04/2014 0826   ALT 18 06/30/2014 1141   BILITOT 0.45 08/04/2014 0826   BILITOT 0.4 06/30/2014 1141       Lab Results  Component Value Date   WBC 8.0 09/14/2014   HGB 13.2 09/14/2014   HCT 38.7 09/14/2014   MCV 95.6 09/14/2014   PLT 477* 09/14/2014   NEUTROABS 4.5 09/14/2014   ASSESSMENT & PLAN:  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 Mammogram 03/05/2015 normal breast density category B. Back pain: MRI lumbar spine showed abnormalities related to disks and spurs between L2-S1  Survivorship: Encouraged her to do physical exercise activity and to decrease red meat  consumption and increase more fruits and vegetables  Return to clinic in 1 year with survivorship clinic for long-term follow-up    Orders Placed This Encounter  Procedures  . Amb Referral to Survivorship Long term    Referral Priority:  Routine    Referral Type:  Consultation    Number of Visits Requested:  1   The patient has a good understanding of the overall plan. she agrees with it. she will call with any problems that may develop before the next visit here.   Rulon Eisenmenger, MD 08/06/2015

## 2015-08-13 ENCOUNTER — Encounter: Admitting: Family Medicine

## 2015-08-15 ENCOUNTER — Other Ambulatory Visit: Payer: Self-pay | Admitting: Family Medicine

## 2015-08-23 ENCOUNTER — Other Ambulatory Visit: Payer: Self-pay | Admitting: Family Medicine

## 2015-08-23 DIAGNOSIS — F909 Attention-deficit hyperactivity disorder, unspecified type: Secondary | ICD-10-CM

## 2015-08-23 NOTE — Telephone Encounter (Signed)
?   OK to Refill  

## 2015-08-23 NOTE — Telephone Encounter (Signed)
Has CPE on12/2/16.  Needs refills Adderall now to hold over and order labs for Wednesday.

## 2015-08-23 NOTE — Telephone Encounter (Signed)
ok 

## 2015-08-24 ENCOUNTER — Other Ambulatory Visit: Payer: Self-pay | Admitting: Family Medicine

## 2015-08-24 DIAGNOSIS — F17200 Nicotine dependence, unspecified, uncomplicated: Secondary | ICD-10-CM

## 2015-08-24 DIAGNOSIS — Z0000 Encounter for general adult medical examination without abnormal findings: Secondary | ICD-10-CM

## 2015-08-24 DIAGNOSIS — E039 Hypothyroidism, unspecified: Secondary | ICD-10-CM

## 2015-08-24 DIAGNOSIS — F909 Attention-deficit hyperactivity disorder, unspecified type: Secondary | ICD-10-CM

## 2015-08-24 DIAGNOSIS — Z79899 Other long term (current) drug therapy: Secondary | ICD-10-CM

## 2015-08-24 MED ORDER — AMPHETAMINE-DEXTROAMPHETAMINE 10 MG PO TABS: 10.0000 mg | ORAL_TABLET | Freq: Two times a day (BID) | ORAL | Status: DC

## 2015-08-24 NOTE — Telephone Encounter (Signed)
Left pt message refill ready and future labs have been ordered for CPE

## 2015-08-25 ENCOUNTER — Other Ambulatory Visit

## 2015-08-25 DIAGNOSIS — E039 Hypothyroidism, unspecified: Secondary | ICD-10-CM

## 2015-08-25 DIAGNOSIS — F909 Attention-deficit hyperactivity disorder, unspecified type: Secondary | ICD-10-CM

## 2015-08-25 DIAGNOSIS — Z0000 Encounter for general adult medical examination without abnormal findings: Secondary | ICD-10-CM

## 2015-08-25 DIAGNOSIS — Z79899 Other long term (current) drug therapy: Secondary | ICD-10-CM

## 2015-08-25 DIAGNOSIS — F17200 Nicotine dependence, unspecified, uncomplicated: Secondary | ICD-10-CM

## 2015-08-25 LAB — COMPLETE METABOLIC PANEL WITH GFR
ALT: 16 U/L (ref 6–29)
AST: 22 U/L (ref 10–35)
Albumin: 4.1 g/dL (ref 3.6–5.1)
Alkaline Phosphatase: 86 U/L (ref 33–130)
BUN: 12 mg/dL (ref 7–25)
CO2: 23 mmol/L (ref 20–31)
Calcium: 9.5 mg/dL (ref 8.6–10.4)
Chloride: 102 mmol/L (ref 98–110)
Creat: 0.57 mg/dL (ref 0.50–1.05)
GFR, Est African American: >89 mL/min (ref >=60)
GFR, Est Non African American: >89 mL/min (ref >=60)
Glucose, Bld: 81 mg/dL (ref 70–99)
Potassium: 4.7 mmol/L (ref 3.5–5.3)
Sodium: 136 mmol/L (ref 135–146)
Total Bilirubin: 0.4 mg/dL (ref 0.2–1.2)
Total Protein: 6.6 g/dL (ref 6.1–8.1)

## 2015-08-25 LAB — CBC WITH DIFFERENTIAL/PLATELET
Basophils Absolute: 0.1 10*3/uL (ref 0.0–0.1)
Basophils Relative: 1 % (ref 0–1)
Eosinophils Absolute: 0.4 10*3/uL (ref 0.0–0.7)
Eosinophils Relative: 4 % (ref 0–5)
HCT: 39.9 % (ref 36.0–46.0)
Hemoglobin: 13.3 g/dL (ref 12.0–15.0)
Lymphocytes Relative: 20 % (ref 12–46)
Lymphs Abs: 2.1 10*3/uL (ref 0.7–4.0)
MCH: 32.4 pg (ref 26.0–34.0)
MCHC: 33.3 g/dL (ref 30.0–36.0)
MCV: 97.3 fL (ref 78.0–100.0)
MPV: 10.3 fL (ref 8.6–12.4)
Monocytes Absolute: 0.7 10*3/uL (ref 0.1–1.0)
Monocytes Relative: 7 % (ref 3–12)
Neutro Abs: 7.1 10*3/uL (ref 1.7–7.7)
Neutrophils Relative %: 68 % (ref 43–77)
Platelets: 496 10*3/uL — ABNORMAL HIGH (ref 150–400)
RBC: 4.1 MIL/uL (ref 3.87–5.11)
RDW: 14 % (ref 11.5–15.5)
WBC: 10.5 10*3/uL (ref 4.0–10.5)

## 2015-08-25 LAB — LIPID PANEL
Cholesterol: 147 mg/dL (ref 125–200)
HDL: 85 mg/dL (ref >=46)
LDL Cholesterol: 49 mg/dL (ref <130)
Total CHOL/HDL Ratio: 1.7 Ratio (ref <=5.0)
Triglycerides: 63 mg/dL (ref <150)
VLDL: 13 mg/dL (ref <30)

## 2015-08-25 LAB — TSH: TSH: 1.488 u[IU]/mL (ref 0.350–4.500)

## 2015-09-03 ENCOUNTER — Ambulatory Visit (INDEPENDENT_AMBULATORY_CARE_PROVIDER_SITE_OTHER): Admitting: Family Medicine

## 2015-09-03 ENCOUNTER — Encounter: Payer: Self-pay | Admitting: Family Medicine

## 2015-09-03 VITALS — BP 130/76 | HR 94 | Temp 98.4°F | Resp 18 | Ht 65.5 in | Wt 126.0 lb

## 2015-09-03 DIAGNOSIS — Z23 Encounter for immunization: Secondary | ICD-10-CM

## 2015-09-03 DIAGNOSIS — G5603 Carpal tunnel syndrome, bilateral upper limbs: Secondary | ICD-10-CM | POA: Diagnosis not present

## 2015-09-03 DIAGNOSIS — Z0000 Encounter for general adult medical examination without abnormal findings: Secondary | ICD-10-CM | POA: Diagnosis not present

## 2015-09-03 NOTE — Addendum Note (Signed)
Addended by: Shary Decamp B on: 09/03/2015 05:09 PM   Modules accepted: Orders

## 2015-09-03 NOTE — Progress Notes (Signed)
Subjective:    Patient ID: [name], female    DOB: 11/10/1955, 59 y.o.   MRN: [id number]  HPI  patient is here today for complete physical exam. She is overdue for colonoscopy. She requested a schedule this for her. Her mammogram was performed in June and was normal. She has a history of a partial abdominal hysterectomy for noncancerous reason and therefore she does not require a Pap smear. Her last bone density was normal in 2015. She is due for a flu shot. She has not yet of age for a shingles vaccine. Because of her smoking she is already had Pneumovax 23. September she was complaining of low back pain and right-sided sciatica. An MRI was obtained at that time which did show right-sided nerve root impingement at L4-L5 and L5-S1. At the present time she is treating it with daily  NSAIDs. However she may be interested in physical therapy or epidural steroid injections in the future should the symptoms worsen. She also complains of bilateral carpal tunnel syndrome. She reports numbness and pain in her hands distal to her wrist every morning. Takes 3 hours for her to "wake her hands up". She is tried anti-inflammatories. She is tried night splints. She is interested in seeing a Psychologist, sport and exercise for correction. Unfortunately she continues to smoke. On MRI in September they mention bilateral adrenal gland fullness left greater than right. However this was also present on a CT scan from 2005. Given the fact there been no symptoms or changes in 11 years I believe this is a benign finding. The patient is comfortable not working this up any further. Appointment on 08/25/2015  Component Date Value Ref Range Status  . WBC 08/25/2015 10.5  4.0 - 10.5 K/uL Final  . RBC 08/25/2015 4.10  3.87 - 5.11 MIL/uL Final  . Hemoglobin 08/25/2015 13.3  12.0 - 15.0 g/dL Final  . HCT 08/25/2015 39.9  36.0 - 46.0 % Final  . MCV 08/25/2015 97.3  78.0 - 100.0 fL Final  . MCH 08/25/2015 32.4  26.0 - 34.0 pg Final  . MCHC  08/25/2015 33.3  30.0 - 36.0 g/dL Final  . RDW 08/25/2015 14.0  11.5 - 15.5 % Final  . Platelets 08/25/2015 496* 150 - 400 K/uL Final  . MPV 08/25/2015 10.3  8.6 - 12.4 fL Final  . Neutrophils Relative % 08/25/2015 68  43 - 77 % Final  . Neutro Abs 08/25/2015 7.1  1.7 - 7.7 K/uL Final  . Lymphocytes Relative 08/25/2015 20  12 - 46 % Final  . Lymphs Abs 08/25/2015 2.1  0.7 - 4.0 K/uL Final  . Monocytes Relative 08/25/2015 7  3 - 12 % Final  . Monocytes Absolute 08/25/2015 0.7  0.1 - 1.0 K/uL Final  . Eosinophils Relative 08/25/2015 4  0 - 5 % Final  . Eosinophils Absolute 08/25/2015 0.4  0.0 - 0.7 K/uL Final  . Basophils Relative 08/25/2015 1  0 - 1 % Final  . Basophils Absolute 08/25/2015 0.1  0.0 - 0.1 K/uL Final  . Smear Review 08/25/2015 Criteria for review not met   Final  . Cholesterol 08/25/2015 147  125 - 200 mg/dL Final  . Triglycerides 08/25/2015 63  <150 mg/dL Final  . HDL 08/25/2015 85  >=46 mg/dL Final  . Total CHOL/HDL Ratio 08/25/2015 1.7  <=5.0 Ratio Final  . VLDL 08/25/2015 13  <30 mg/dL Final  . LDL Cholesterol 08/25/2015 49  <130 mg/dL Final   Comment:   Total Cholesterol/HDL Ratio:CHD  Risk                        Coronary Heart Disease Risk Table                                        Men       Women          1/2 Average Risk              3.4        3.3              Average Risk              5.0        4.4           2X Average Risk              9.6        7.1           3X Average Risk             23.4       11.0 Use the calculated Patient Ratio above and the CHD Risk table  to determine the patient's CHD Risk.   Marland Kitchen TSH 08/25/2015 1.488  0.350 - 4.500 uIU/mL Final  . Sodium 08/25/2015 136  135 - 146 mmol/L Final  . Potassium 08/25/2015 4.7  3.5 - 5.3 mmol/L Final  . Chloride 08/25/2015 102  98 - 110 mmol/L Final  . CO2 08/25/2015 23  20 - 31 mmol/L Final  . Glucose, Bld 08/25/2015 81  70 - 99 mg/dL Final  . BUN 08/25/2015 12  7 - 25 mg/dL Final  . Creat  08/25/2015 0.57  0.50 - 1.05 mg/dL Final  . Total Bilirubin 08/25/2015 0.4  0.2 - 1.2 mg/dL Final  . Alkaline Phosphatase 08/25/2015 86  33 - 130 U/L Final  . AST 08/25/2015 22  10 - 35 U/L Final  . ALT 08/25/2015 16  6 - 29 U/L Final  . Total Protein 08/25/2015 6.6  6.1 - 8.1 g/dL Final  . Albumin 08/25/2015 4.1  3.6 - 5.1 g/dL Final  . Calcium 08/25/2015 9.5  8.6 - 10.4 mg/dL Final  . GFR, Est African American 08/25/2015 >89  >=60 mL/min Final  . GFR, Est Non African American 08/25/2015 >89  >=60 mL/min Final   Comment:   The estimated GFR is a calculation valid for adults (>=42 years old) that uses the CKD-EPI algorithm to adjust for age and sex. It is   not to be used for children, pregnant women, hospitalized patients,    patients on dialysis, or with rapidly changing kidney function. According to the NKDEP, eGFR >89 is normal, 60-89 shows mild impairment, 30-59 shows moderate impairment, 15-29 shows severe impairment and <15 is ESRD.      Past Medical History  Diagnosis Date  . 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 ADHD (attention deficit hyperactivity disorder) 09/07/2011  . DVT (deep venous thrombosis) ([place]) 09/07/2011  . Hypothyroid 09/07/2011  . Coronary artery disease   . Smoker 02/17/2013  . Carotid artery occlusion    Past Surgical History  Procedure Laterality Date  . Breast lumpectomy  March 2013    Left breast  . Eye surgery Right Sept. 2014    Lasix - Laser  .  Abdominal hysterectomy     Current Outpatient Prescriptions on File Prior to Visit  Medication Sig Dispense Refill  . amphetamine-dextroamphetamine (ADDERALL) 10 MG tablet Take 1 tablet (10 mg total) by mouth 2 (two) times daily. 60 tablet 0  . B Complex-C (B-COMPLEX WITH VITAMIN C) tablet Take 1 tablet by mouth daily.     . cholecalciferol (VITAMIN D) 1000 UNITS tablet Take 1,000 Units by mouth daily.    . CRESTOR 10 MG tablet TAKE 1 TABLET DAILY 90 tablet 0  . levothyroxine  (SYNTHROID, LEVOTHROID) 75 MCG tablet TAKE 1 TABLET DAILY 90 tablet 0  . Multiple Vitamin (MULTIVITAMIN) tablet Take 1 tablet by mouth daily.      . naproxen (NAPROSYN) 375 MG tablet TAKE 1 TABLET TWO TIMES DAILY WITH A MEAL 180 tablet 3  . NEXIUM 40 MG capsule TAKE 1 CAPSULE TWICE A DAY 180 capsule 0   No current facility-administered medications on file prior to visit.   Allergies  Allergen Reactions  . Penicillins Hives   Social History   Social History  . Marital Status: Divorced    Spouse Name: N/A  . Number of Children: N/A  . Years of Education: N/A   Occupational History  . Not on file.   Social History Main Topics  . Smoking status: Light Tobacco Smoker    Types: E-cigarettes  . Smokeless tobacco: Never Used  . Alcohol Use: No  . Drug Use: No  . Sexual Activity: No   Other Topics Concern  . Not on file   Social History Narrative   Family History  Problem Relation Age of Onset  . Cancer Father     Stomach  . Crohn's disease Daughter       Review of Systems  All other systems reviewed and are negative.      Objective:   Physical Exam  Constitutional: She is oriented to person, place, and time. She appears well-developed and well-nourished. No distress.  HENT:  Head: Normocephalic and atraumatic.  Right Ear: External ear normal.  Left Ear: External ear normal.  Nose: Nose normal.  Mouth/Throat: Oropharynx is clear and moist. No oropharyngeal exudate.  Eyes: Conjunctivae and EOM are normal. Pupils are equal, round, and reactive to light. Right eye exhibits no discharge. Left eye exhibits no discharge. No scleral icterus.  Neck: Normal range of motion. Neck supple. No JVD present. No tracheal deviation present. No thyromegaly present.  Cardiovascular: Normal rate, regular rhythm, normal heart sounds and intact distal pulses.  Exam reveals no gallop and no friction rub.   No murmur heard. Pulmonary/Chest: Effort normal and breath sounds normal. No  stridor. No respiratory distress. She has no wheezes. She has no rales. She exhibits no tenderness.  Abdominal: Soft. Bowel sounds are normal. She exhibits no distension and no mass. There is no tenderness. There is no rebound and no guarding.  Musculoskeletal: Normal range of motion. She exhibits no edema or tenderness.  Lymphadenopathy:    She has no cervical adenopathy.  Neurological: She is alert and oriented to person, place, and time. She has normal reflexes. She displays normal reflexes. No cranial nerve deficit. She exhibits normal muscle tone. Coordination normal.  Skin: Skin is warm. No rash noted. She is not diaphoretic. No erythema. No pallor.  Psychiatric: She has a normal mood and affect. Her behavior is normal. Judgment and thought content normal.  Vitals reviewed.         Assessment & Plan:  Routine general medical examination at  a health care facility - Plan: Ambulatory referral to Gastroenterology  Bilateral carpal tunnel syndrome - Plan: Ambulatory referral to Orthopedic Surgery   Patient's physical exam today is normal. Her lab work is excellent. She received her flu shot today. The remainder of her immunizations and cancer screening is up-to-date. I will refer the patient back to her gastroenterologist for colonoscopy I will also refer her to a hand specialist for bilateral carpal tunnel syndrome. She defers physical therapy or epidural steroid injections at the present time.

## 2015-09-16 ENCOUNTER — Telehealth: Payer: Self-pay | Admitting: Family Medicine

## 2015-09-16 DIAGNOSIS — F909 Attention-deficit hyperactivity disorder, unspecified type: Secondary | ICD-10-CM

## 2015-09-16 NOTE — Telephone Encounter (Signed)
Requesting a refill on Adderall - ? OK to Refill x 3 months

## 2015-09-17 NOTE — Telephone Encounter (Signed)
ok 

## 2015-09-20 MED ORDER — AMPHETAMINE-DEXTROAMPHETAMINE 10 MG PO TABS: 10.0000 mg | ORAL_TABLET | Freq: Two times a day (BID) | ORAL | Status: DC

## 2015-09-20 NOTE — Telephone Encounter (Signed)
RX printed x 3 months, left up front and patient aware to pick up  

## 2015-09-26 IMAGING — MG MM DIAG BREAST TOMO BILATERAL
9 series · 9 of 25 positions shown · non-contrast
Comparison: Previous examinations.

CLINICAL DATA: Status post left lumpectomy and radiation therapy
for breast cancer in 2222.

EXAM:
DIGITAL DIAGNOSTIC BILATERAL MAMMOGRAM WITH 3D TOMOSYNTHESIS AND CAD

[L TAN]
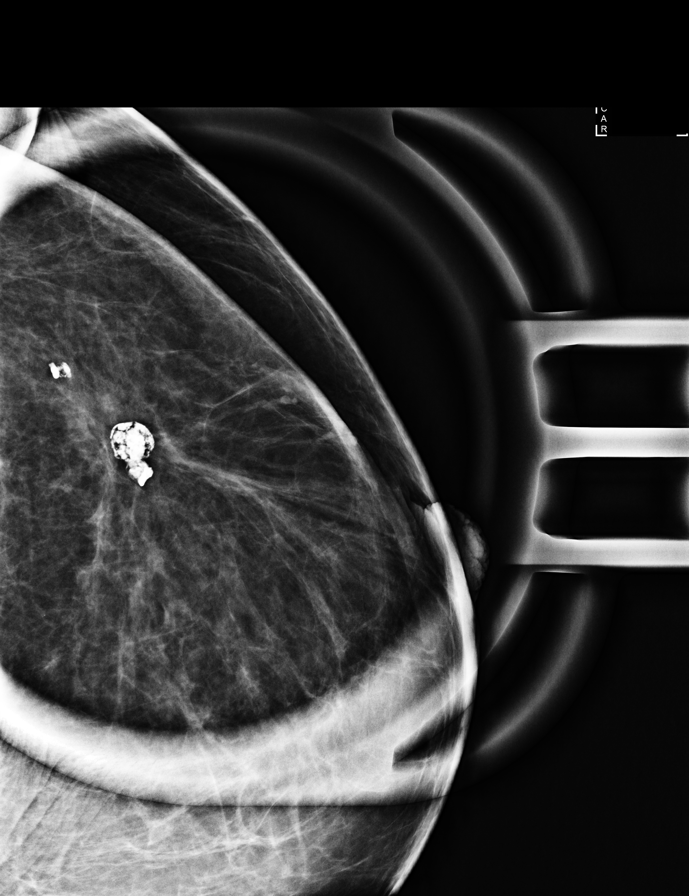

[R CC]
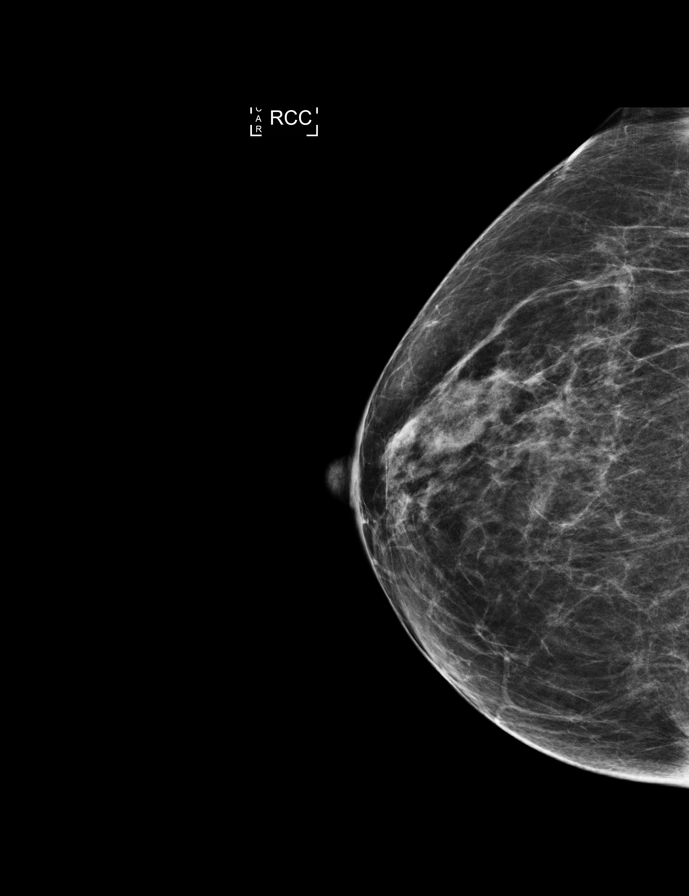

[L MLO]
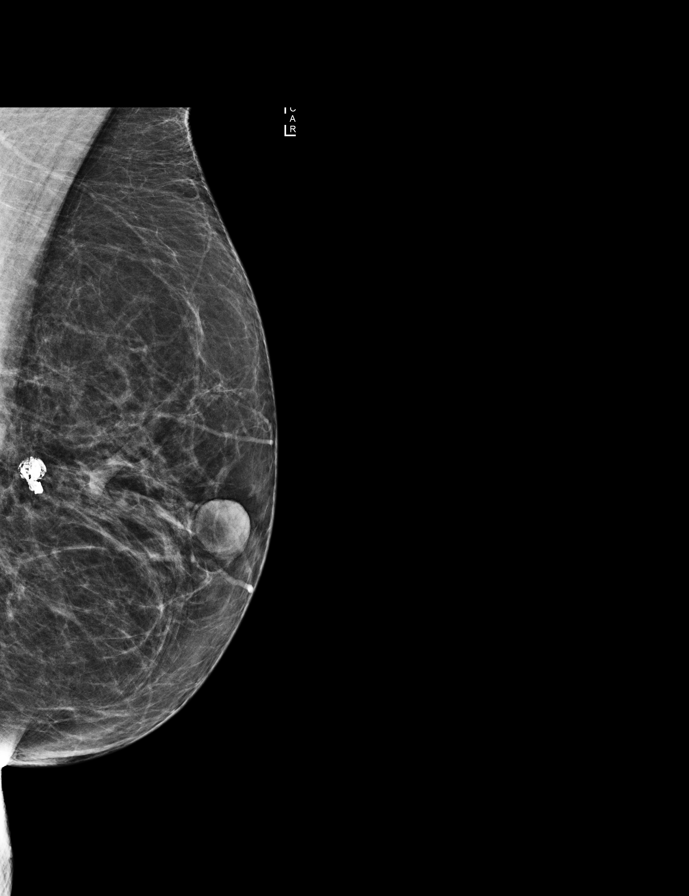

[R MLO]
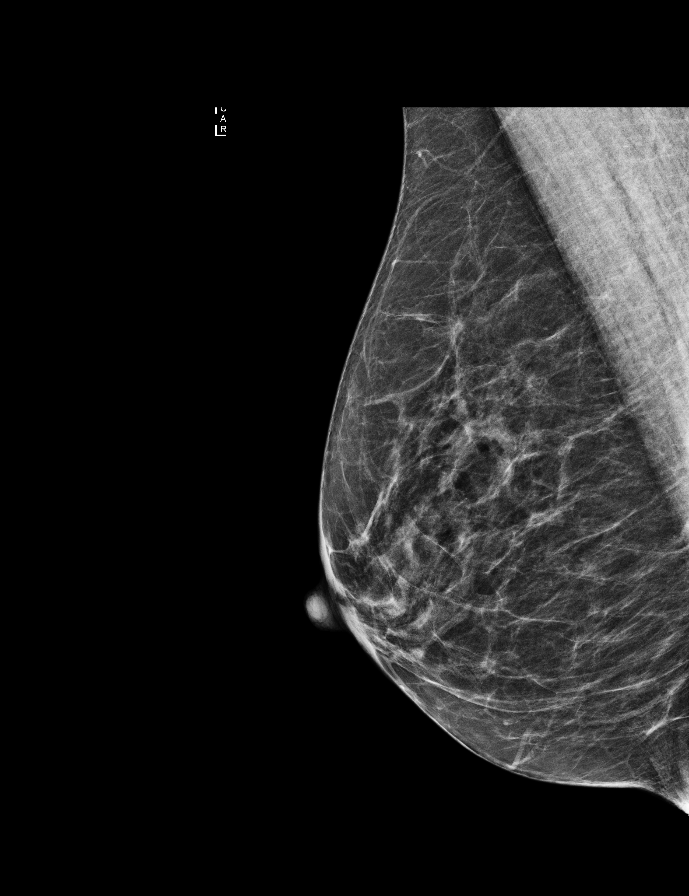

[L CC]
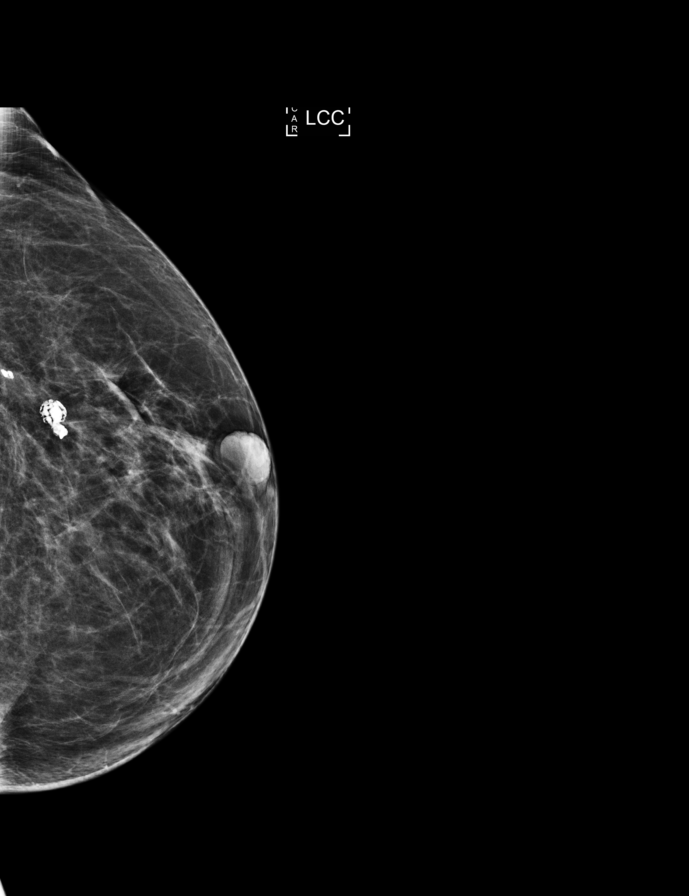

[R MLO tomo · tomo slice 25/50.0]
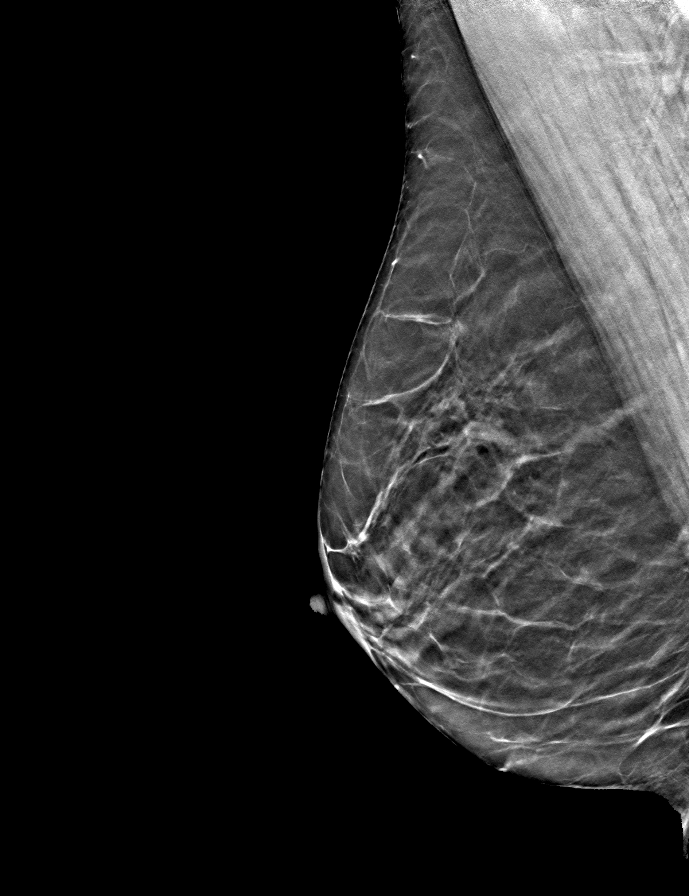

[R CC tomo · tomo slice 27/54.0]
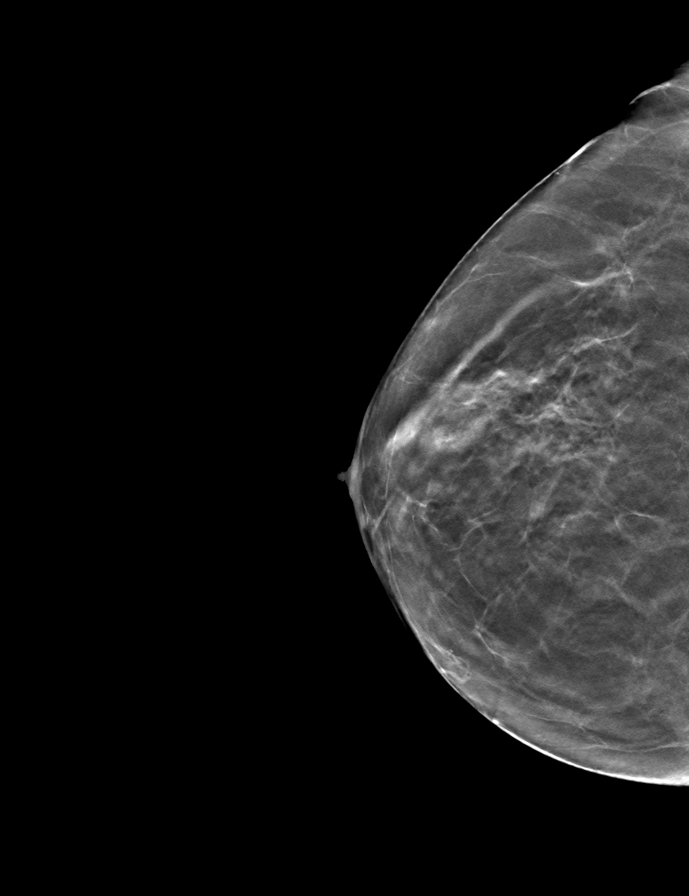

[L CC tomo · tomo slice 27/52.0]
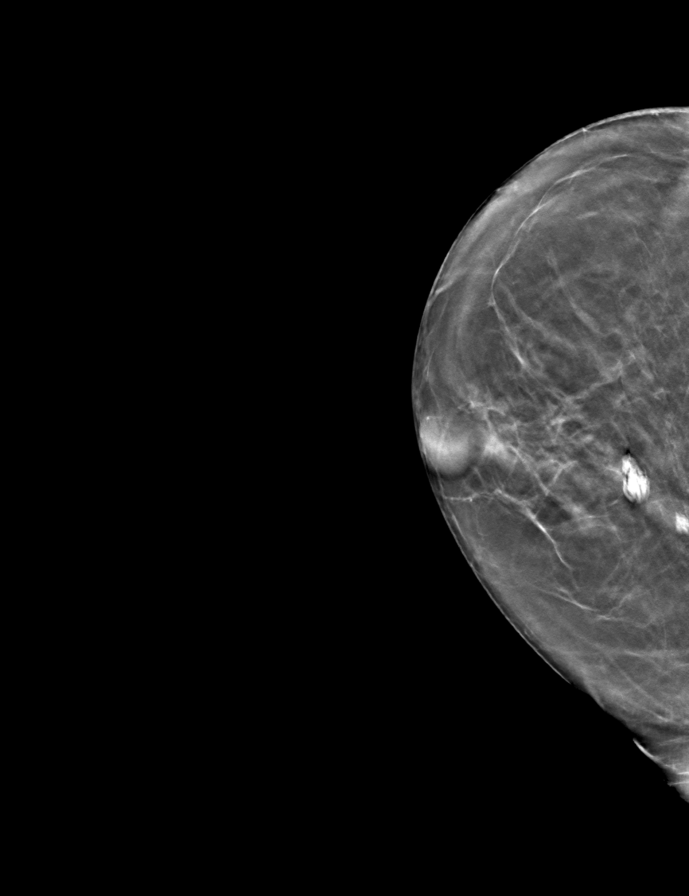

[L MLO tomo · tomo slice 25/50.0]
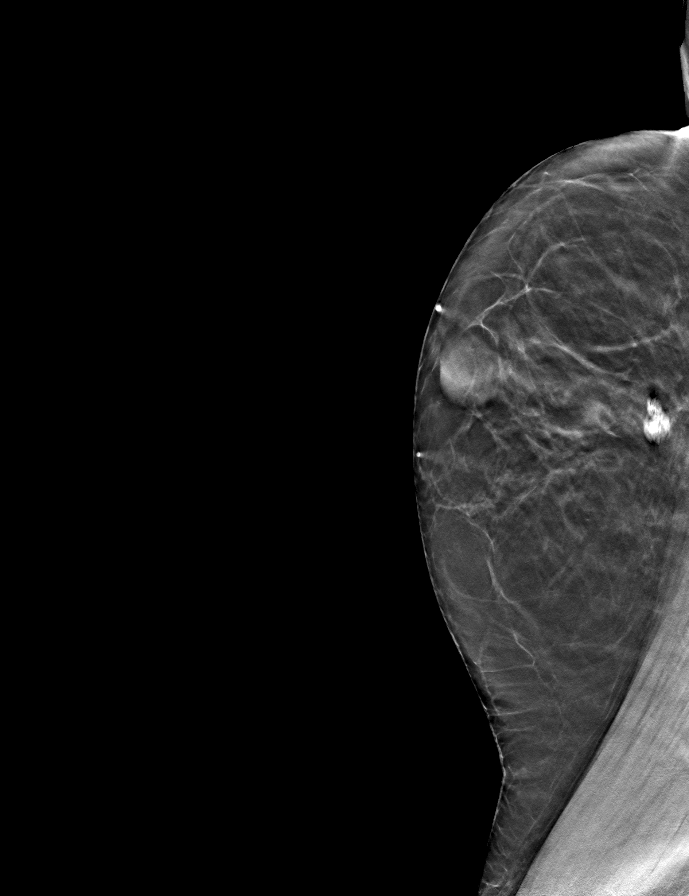

[9 of 25 positions shown; findings below may reference images not displayed]

ACR Breast Density Category b: There are scattered areas of
fibroglandular density.
FINDINGS: Stable post lumpectomy changes on the left. No new findings
suspicious for malignancy in either breast.

Mammographic images were processed with CAD.
IMPRESSION: No evidence of malignancy.

RECOMMENDATION:
Bilateral screening mammogram in 1 year.

I have discussed the findings and recommendations with the patient.
Results were also provided in writing at the conclusion of the
visit. If applicable, a reminder letter will be sent to the patient
regarding the next appointment.

BI-RADS CATEGORY  2: Benign.

## 2015-10-04 ENCOUNTER — Encounter: Payer: Self-pay | Admitting: Family Medicine

## 2015-11-03 ENCOUNTER — Other Ambulatory Visit: Payer: Self-pay | Admitting: Family Medicine

## 2015-11-03 NOTE — Telephone Encounter (Signed)
Medication refilled per protocol. 

## 2015-12-02 ENCOUNTER — Other Ambulatory Visit: Payer: Self-pay | Admitting: Family Medicine

## 2015-12-02 DIAGNOSIS — F909 Attention-deficit hyperactivity disorder, unspecified type: Secondary | ICD-10-CM

## 2015-12-03 ENCOUNTER — Telehealth: Payer: Self-pay | Admitting: *Deleted

## 2015-12-03 DIAGNOSIS — M6281 Muscle weakness (generalized): Secondary | ICD-10-CM

## 2015-12-03 DIAGNOSIS — Y93B9 Activity, other involving muscle strengthening exercises: Secondary | ICD-10-CM

## 2015-12-03 NOTE — Telephone Encounter (Signed)
ok 

## 2015-12-03 NOTE — Telephone Encounter (Signed)
CPE 09/2015  Wants 3 month refills.  OK?

## 2015-12-03 NOTE — Telephone Encounter (Signed)
Pt called stating that she went to her orthopedics and he had suggested to her that she should consult with a  PT to help strenghten her muscles around disc and with R leg weakness.   ?ok to place referral to PT

## 2015-12-06 MED ORDER — AMPHETAMINE-DEXTROAMPHETAMINE 10 MG PO TABS: 10.0000 mg | ORAL_TABLET | Freq: Two times a day (BID) | ORAL | Status: DC

## 2015-12-06 NOTE — Telephone Encounter (Signed)
Referral place to PT

## 2015-12-06 NOTE — Telephone Encounter (Signed)
Rx's ready and pt made aware thru MyChart

## 2016-01-06 ENCOUNTER — Other Ambulatory Visit: Payer: Self-pay | Admitting: Family Medicine

## 2016-01-06 NOTE — Telephone Encounter (Signed)
OK to refill early?

## 2016-01-06 NOTE — Telephone Encounter (Signed)
ok 

## 2016-01-06 NOTE — Telephone Encounter (Signed)
Pt accidentally ruined several Adderall pills and will need a refill of  7 days early. She states that the pharmacy will be contacting us for authorization

## 2016-01-06 NOTE — Telephone Encounter (Signed)
Received call from [facility] stating that pt called wanting refill on her adderall states that pt mentioned she dropped them in water, pharmacy is calling to get authorization to refill early.   Kristopher Oppenheim [phone]

## 2016-01-07 NOTE — Telephone Encounter (Signed)
Spoke to Pharmacy at [facility] and gave verbal stating ok to refill prescription

## 2016-02-18 ENCOUNTER — Telehealth: Payer: Self-pay | Admitting: Family Medicine

## 2016-02-18 DIAGNOSIS — F909 Attention-deficit hyperactivity disorder, unspecified type: Secondary | ICD-10-CM

## 2016-02-18 NOTE — Telephone Encounter (Signed)
Pt is requesting a refill of Adderall 10 mg [phone]

## 2016-02-18 NOTE — Telephone Encounter (Signed)
Ok to refill??  Last office visit 09/07/2015.  Last refill 12/06/2015, # 3 refills.

## 2016-02-21 MED ORDER — AMPHETAMINE-DEXTROAMPHETAMINE 10 MG PO TABS: 10.0000 mg | ORAL_TABLET | Freq: Two times a day (BID) | ORAL | Status: DC

## 2016-02-21 NOTE — Telephone Encounter (Signed)
ok 

## 2016-02-21 NOTE — Telephone Encounter (Signed)
Prescription printed x3 and patient made aware to come to office to pick up after 2pm on 02/21/2016.

## 2016-03-02 ENCOUNTER — Other Ambulatory Visit: Payer: Self-pay | Admitting: Family Medicine

## 2016-03-02 DIAGNOSIS — Z1231 Encounter for screening mammogram for malignant neoplasm of breast: Secondary | ICD-10-CM

## 2016-03-15 ENCOUNTER — Ambulatory Visit

## 2016-03-16 ENCOUNTER — Ambulatory Visit
Admission: RE | Admit: 2016-03-16 | Discharge: 2016-03-16 | Disposition: A | Discharge status: Home / Self Care | Source: Ambulatory Visit | Attending: Family Medicine | Admitting: Family Medicine

## 2016-03-16 DIAGNOSIS — Z1231 Encounter for screening mammogram for malignant neoplasm of breast: Secondary | ICD-10-CM

## 2016-03-22 ENCOUNTER — Other Ambulatory Visit: Payer: Self-pay | Admitting: Family Medicine

## 2016-03-29 ENCOUNTER — Other Ambulatory Visit: Payer: Self-pay | Admitting: Family Medicine

## 2016-03-29 DIAGNOSIS — F909 Attention-deficit hyperactivity disorder, unspecified type: Secondary | ICD-10-CM

## 2016-03-29 MED ORDER — AMPHETAMINE-DEXTROAMPHETAMINE 10 MG PO TABS: 10.0000 mg | ORAL_TABLET | Freq: Two times a day (BID) | ORAL | Status: DC

## 2016-04-12 ENCOUNTER — Ambulatory Visit (INDEPENDENT_AMBULATORY_CARE_PROVIDER_SITE_OTHER): Admitting: Physician Assistant

## 2016-04-12 ENCOUNTER — Encounter: Payer: Self-pay | Admitting: Physician Assistant

## 2016-04-12 VITALS — BP 138/86 | HR 84 | Temp 98.6°F | Resp 18 | Wt 125.0 lb

## 2016-04-12 DIAGNOSIS — J029 Acute pharyngitis, unspecified: Secondary | ICD-10-CM | POA: Diagnosis not present

## 2016-04-12 LAB — STREP GROUP A AG, W/REFLEX TO CULT: STREGTOCOCCUS GROUP A AG SCREEN: NOT DETECTED

## 2016-04-12 MED ORDER — AZITHROMYCIN 250 MG PO TABS: ORAL_TABLET | ORAL | Status: DC

## 2016-04-12 NOTE — Progress Notes (Signed)
Patient ID: [name] MRN: [id number], DOB: [birth year], 60 y.o. Date of Encounter: 04/12/2016, 4:09 PM    Chief Complaint:  Chief Complaint  Patient presents with  . sick x 2 days    sore throat     HPI: 60 y.o. year old female presents with above. Says that today is worse. Could barely swallow secondary to throat being so sore. That she also feels some drainage in her throat. Has had very minimal nasal congestion that just started today. Smoker's cough that is chronic but has not noticed any increased cough. Is that she found out after the fact that her neighbor's child had a sore throat when the child was at her house this weekend and after that child was diagnosed with strep throat. Patient concerned because she is supposed to be with her own grandchild this upcoming Friday and does not want to get them strep throat. Has had no fevers or chills. No other complaints or concerns.     Home Meds:   Outpatient Prescriptions Prior to Visit  Medication Sig Dispense Refill  . [START ON 05/29/2016] amphetamine-dextroamphetamine (ADDERALL) 10 MG tablet Take 1 tablet (10 mg total) by mouth 2 (two) times daily. 60 tablet 0  . B Complex-C (B-COMPLEX WITH VITAMIN C) tablet Take 1 tablet by mouth daily.     . cholecalciferol (VITAMIN D) 1000 UNITS tablet Take 1,000 Units by mouth daily.    Marland Kitchen esomeprazole (NEXIUM) 40 MG capsule Take 1 capsule (40 mg total) by mouth 2 (two) times daily before a meal. 180 capsule 3  . levothyroxine (SYNTHROID, LEVOTHROID) 75 MCG tablet Take 1 tablet (75 mcg total) by mouth daily before breakfast. 90 tablet 1  . Multiple Vitamin (MULTIVITAMIN) tablet Take 1 tablet by mouth daily.      . naproxen (NAPROSYN) 375 MG tablet TAKE 1 TABLET TWO TIMES DAILY WITH A MEAL 180 tablet 3  . rosuvastatin (CRESTOR) 10 MG tablet Take 1 tablet (10 mg total) by mouth daily. 90 tablet 1   No facility-administered medications prior to visit.    Allergies:  Allergies  Allergen  Reactions  . Penicillins Hives      Review of Systems: See HPI for pertinent ROS. All other ROS negative.    Physical Exam: Blood pressure 138/86, pulse 84, temperature 98.6 F (37 C), temperature source Oral, resp. rate 18, weight 125 lb (56.7 kg)., Body mass index is 20.48 kg/(m^2). General:  WNWD WF. Appears in no acute distress. HEENT: Normocephalic, atraumatic, eyes without discharge, sclera non-icteric, nares are without discharge. Bilateral auditory canals clear, TM's are without perforation, pearly grey and translucent with reflective cone of light bilaterally. Oral cavity moist, posterior pharynx with mod erythema. No exudate, no peritonsillar abscess.  Neck: Supple. No thyromegaly. No lymphadenopathy. Lungs: Clear bilaterally to auscultation without wheezes, rales, or rhonchi. Breathing is unlabored. Heart: Regular rhythm. No murmurs, rubs, or gallops. Msk:  Strength and tone normal for age. Extremities/Skin: Warm and dry. Neuro: Alert and oriented X 3. Moves all extremities spontaneously. Gait is normal. CNII-XII grossly in tact. Psych:  Responds to questions appropriately with a normal affect.     ASSESSMENT AND PLAN:  60 y.o. year old female with  1. Acute pharyngitis, unspecified etiology But strep test is negative. However given patient's symptoms and exam findings and recent strep exposure we'll go ahead and treat to cover for strep. She has penicillin allergy so will use second drug choice to cover strep azithromycin 500 mg daily for  5 days. She is to take as directed complete all of it. Follow-up if symptoms do not resolve after completion of antibiotic. Can use lozenges spray Tylenol Motrin for pain relief in the interim. - azithromycin (ZITHROMAX) 250 MG tablet; Take 2 daily for 5 days.  Dispense: 10 tablet; Refill: 0  2. Sorethroat - STREP GROUP A AG, W/REFLEX TO CULT   Signed, [address] [place], [place], [facility] 04/12/2016 4:09 PM

## 2016-05-01 ENCOUNTER — Other Ambulatory Visit: Payer: Self-pay | Admitting: Family Medicine

## 2016-06-09 ENCOUNTER — Telehealth: Payer: Self-pay | Admitting: Family Medicine

## 2016-06-09 DIAGNOSIS — F909 Attention-deficit hyperactivity disorder, unspecified type: Secondary | ICD-10-CM

## 2016-06-09 NOTE — Telephone Encounter (Signed)
Patient requesting a refill on her adderall.  CB#[phone]

## 2016-06-09 NOTE — Telephone Encounter (Signed)
Ok to refill 

## 2016-06-09 NOTE — Telephone Encounter (Signed)
ok 

## 2016-06-12 MED ORDER — AMPHETAMINE-DEXTROAMPHETAMINE 10 MG PO TABS: 10.0000 mg | ORAL_TABLET | Freq: Two times a day (BID) | ORAL | 0 refills | Status: DC

## 2016-06-12 NOTE — Telephone Encounter (Signed)
RX printed x 3 months, left up front and patient aware to pick up  

## 2016-06-19 ENCOUNTER — Ambulatory Visit: Admitting: Family

## 2016-06-19 ENCOUNTER — Encounter (HOSPITAL_COMMUNITY)

## 2016-07-01 ENCOUNTER — Other Ambulatory Visit: Payer: Self-pay | Admitting: Family Medicine

## 2016-07-21 ENCOUNTER — Telehealth: Payer: Self-pay | Admitting: Adult Health

## 2016-07-21 NOTE — Telephone Encounter (Signed)
11/10 Appointment rescheduled per patient request to 12/27 per patient request. Patient prefers early AM appointments per her availability.

## 2016-07-31 ENCOUNTER — Encounter (HOSPITAL_COMMUNITY)

## 2016-07-31 ENCOUNTER — Ambulatory Visit: Admitting: Family

## 2016-08-11 ENCOUNTER — Encounter: Admitting: Adult Health

## 2016-08-17 ENCOUNTER — Encounter: Payer: Self-pay | Admitting: Family Medicine

## 2016-08-21 ENCOUNTER — Encounter: Payer: Self-pay | Admitting: Family Medicine

## 2016-08-21 DIAGNOSIS — Z79899 Other long term (current) drug therapy: Secondary | ICD-10-CM

## 2016-08-21 DIAGNOSIS — E039 Hypothyroidism, unspecified: Secondary | ICD-10-CM

## 2016-08-21 DIAGNOSIS — Z0000 Encounter for general adult medical examination without abnormal findings: Secondary | ICD-10-CM

## 2016-08-23 ENCOUNTER — Encounter: Payer: Self-pay | Admitting: Family

## 2016-08-28 ENCOUNTER — Other Ambulatory Visit

## 2016-08-28 DIAGNOSIS — Z79899 Other long term (current) drug therapy: Secondary | ICD-10-CM

## 2016-08-28 DIAGNOSIS — Z0000 Encounter for general adult medical examination without abnormal findings: Secondary | ICD-10-CM

## 2016-08-28 DIAGNOSIS — E039 Hypothyroidism, unspecified: Secondary | ICD-10-CM

## 2016-08-29 LAB — CBC WITH DIFFERENTIAL/PLATELET
Basophils Absolute: 86 cells/uL (ref 0–200)
Basophils Relative: 1 %
Eosinophils Absolute: 344 cells/uL (ref 15–500)
Eosinophils Relative: 4 %
HCT: 39 % (ref 35.0–45.0)
Hemoglobin: 13 g/dL (ref 12.0–15.0)
Lymphocytes Relative: 19 %
Lymphs Abs: 1634 cells/uL (ref 850–3900)
MCH: 32.7 pg (ref 27.0–33.0)
MCHC: 33.3 g/dL (ref 32.0–36.0)
MCV: 98 fL (ref 80.0–100.0)
MPV: 9.9 fL (ref 7.5–12.5)
Monocytes Absolute: 602 cells/uL (ref 200–950)
Monocytes Relative: 7 %
Neutro Abs: 5934 cells/uL (ref 1500–7800)
Neutrophils Relative %: 69 %
Platelets: 556 10*3/uL — ABNORMAL HIGH (ref 140–400)
RBC: 3.98 MIL/uL (ref 3.80–5.10)
RDW: 13.7 % (ref 11.0–15.0)
WBC: 8.6 10*3/uL (ref 3.8–10.8)

## 2016-08-29 LAB — COMPREHENSIVE METABOLIC PANEL
ALT: 23 U/L (ref 6–29)
AST: 27 U/L (ref 10–35)
Albumin: 4.4 g/dL (ref 3.6–5.1)
Alkaline Phosphatase: 83 U/L (ref 33–130)
BUN: 14 mg/dL (ref 7–25)
CO2: 27 mmol/L (ref 20–31)
Calcium: 9.7 mg/dL (ref 8.6–10.4)
Chloride: 99 mmol/L (ref 98–110)
Creat: 0.65 mg/dL (ref 0.50–0.99)
Glucose, Bld: 89 mg/dL (ref 70–99)
Potassium: 4 mmol/L (ref 3.5–5.3)
Sodium: 135 mmol/L (ref 135–146)
Total Bilirubin: 0.4 mg/dL (ref 0.2–1.2)
Total Protein: 6.8 g/dL (ref 6.1–8.1)

## 2016-08-29 LAB — TSH: TSH: 1.75 mIU/L

## 2016-08-30 ENCOUNTER — Encounter: Payer: Self-pay | Admitting: Family

## 2016-08-30 ENCOUNTER — Ambulatory Visit (HOSPITAL_COMMUNITY)
Admission: RE | Admit: 2016-08-30 | Discharge: 2016-08-30 | Disposition: A | Discharge status: Home / Self Care | Source: Ambulatory Visit | Attending: Family | Admitting: Family

## 2016-08-30 ENCOUNTER — Ambulatory Visit (INDEPENDENT_AMBULATORY_CARE_PROVIDER_SITE_OTHER): Admitting: Family

## 2016-08-30 VITALS — BP 133/78 | HR 79 | Ht 65.5 in | Wt 121.0 lb

## 2016-08-30 DIAGNOSIS — I773 Arterial fibromuscular dysplasia: Secondary | ICD-10-CM

## 2016-08-30 DIAGNOSIS — F17200 Nicotine dependence, unspecified, uncomplicated: Secondary | ICD-10-CM

## 2016-08-30 DIAGNOSIS — I6523 Occlusion and stenosis of bilateral carotid arteries: Secondary | ICD-10-CM

## 2016-08-30 LAB — VAS US CAROTID
LEFT ECA DIAS: -16 cm/s
LEFT VERTEBRAL DIAS: -20 cm/s
Left CCA dist dias: -21 cm/s
Left CCA dist sys: -74 cm/s
Left CCA prox dias: 31 cm/s
Left CCA prox sys: 111 cm/s
Left ICA dist dias: -50 cm/s
Left ICA dist sys: -130 cm/s
Left ICA prox dias: -23 cm/s
Left ICA prox sys: -67 cm/s
RIGHT CCA MID DIAS: -20 cm/s
RIGHT ECA DIAS: -17 cm/s
RIGHT VERTEBRAL DIAS: -22 cm/s
Right CCA prox dias: 23 cm/s
Right CCA prox sys: 99 cm/s
Right cca dist sys: -126 cm/s

## 2016-08-30 NOTE — Progress Notes (Signed)
Chief Complaint: Follow up Extracranial Carotid Artery Stenosis   History of Present Illness  [name] is a 60 y.o. female patient of Dr. 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 About 2009 she had a syncopal episode, was evaluated at [facility], and states that evaluation was inconclusive for TIA or stroke. She denies headaches, denies postprandial abdominal pain, does not have hypertension.   Pt does not seem to have claudication symptoms with walking.  Patient has not had previous carotid artery intervention.  Pt reports New Medical or Surgical History: she is receiving ESI's for 3 lumbar HNP, 2 thoracic spine HNP. Her legs throb and tingle when she stands from a sitting position.  Pt Diabetic: No Pt smoker: smoker (1 ppd, started at age 31 yrs)  Pt meds include: Statin: yes ASA: Yes Other anticoagulants/antiplatelets: no    Past Medical History:  Diagnosis Date  . ADHD (attention deficit hyperactivity disorder) 09/07/2011  . Carotid artery occlusion   . Coronary artery disease   . 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 DVT (deep venous thrombosis) ([place]) 09/07/2011  . Hypothyroid 09/07/2011  . Smoker 02/17/2013    Social History Social History  Substance Use Topics  . Smoking status: Current Every Day Smoker    Types: E-cigarettes  . Smokeless tobacco: Never Used  . Alcohol use No    Family History Family History  Problem Relation Age of Onset  . Cancer Father     Stomach  . Crohn's disease Daughter     Surgical History Past Surgical History:  Procedure Laterality Date  . ABDOMINAL HYSTERECTOMY    . BREAST LUMPECTOMY  March 2013   Left breast  . EYE SURGERY Right Sept. 2014   Lasix - Laser    Allergies  Allergen Reactions  . Penicillins Hives    Current Outpatient Prescriptions  Medication Sig Dispense Refill  . amphetamine-dextroamphetamine (ADDERALL) 10 MG  tablet Take 1 tablet (10 mg total) by mouth 2 (two) times daily. 60 tablet 0  . B Complex-C (B-COMPLEX WITH VITAMIN C) tablet Take 1 tablet by mouth daily.     . cholecalciferol (VITAMIN D) 1000 UNITS tablet Take 1,000 Units by mouth daily.    Marland Kitchen esomeprazole (NEXIUM) 40 MG capsule Take 1 capsule (40 mg total) by mouth 2 (two) times daily before a meal. 180 capsule 3  . levothyroxine (SYNTHROID, LEVOTHROID) 75 MCG tablet TAKE 1 TABLET DAILY BEFORE BREAKFAST 90 tablet 2  . Multiple Vitamin (MULTIVITAMIN) tablet Take 1 tablet by mouth daily.      . naproxen (NAPROSYN) 375 MG tablet TAKE 1 TABLET TWO TIMES DAILY WITH A MEAL 180 tablet 3  . rosuvastatin (CRESTOR) 10 MG tablet TAKE 1 TABLET DAILY 90 tablet 1   No current facility-administered medications for this visit.     Review of Systems : See HPI for pertinent positives and negatives.  Physical Examination  Vitals:   08/30/16 0901 08/30/16 0904  BP: 137/82 133/78  Pulse: 79   SpO2: 100%   Weight: 121 lb (54.9 kg)   Height: 5' 5.5" (1.664 m)    Body mass index is 19.83 kg/m.  General: WDWN thin female in NAD GAIT: normal Eyes: PERRLA Pulmonary: Respirations are non-labored, CTAB, good air movement in all fields.   Cardiac: regular rhythm, no detected murmur.  VASCULAR EXAM Carotid Bruits Right Left   Negative Negative   Aorta is not palpable. Radial pulses  are 2+ palpable and equal.      LE Pulses Right Left   POPLITEAL not palpable  1+ palpable   POSTERIOR TIBIAL 2+ palpable  2+ palpable    DORSALIS PEDIS  ANTERIOR TIBIAL not palpable  1+ palpable     Gastrointestinal: soft, nontender, BS WNL, no r/g,no palpable masses.  Musculoskeletal: No muscle atrophy/wasting. M/S 5/5 throughout, Extremities without ischemic changes.  Neurologic: A&O  X 3; Appropriate Affect, Speech is normal CN 2-12 intact, Pain and light touch intact in extremities, Motor exam as listed above.     Assessment: [name] is a 60 y.o. female who has bilateral internal carotid artery stenosis that remains consistent with fibromuscular dysplasia. Her blood pressure remains in the normal range. She has no history of stroke or TIA.  Fortunately she does not have DM, but unfortunately she continues to smoke, has a history of breast cancer, see Plan.   DATA Today's carotid Duplex suggests patent bilateral internal carotid arteries with elevated velocity at the mid to distal segments suggestive of 40 - 59% stenosis, lower end of range. No plaque or tortuosity visualized, This may be suggestive of fibromuscular dysplasia. No significant change from a year ago.   Plan:  The patient was counseled re smoking cessation and given several free resources re smoking cessation.   Follow-up in 1 year with Carotid Duplex scan.   I discussed in depth with the patient the nature of atherosclerosis, and emphasized the importance of maximal medical management including strict control of blood pressure, blood glucose, and lipid levels, obtaining regular exercise, and cessation of smoking.  The patient is aware that without maximal medical management the underlying atherosclerotic disease process will progress, limiting the benefit of any interventions. The patient was given information about stroke prevention and what symptoms should prompt the patient to seek immediate medical care. Thank you for allowing [place] to participate in this patient's care.  Clemon Chambers, RN, MSN, FNP-C [facility] Office: [phone]  Clinic Physician: Early  08/30/16 9:22 AM

## 2016-08-30 NOTE — Addendum Note (Signed)
Addended by: Lianne Cure A on: 08/30/2016 11:51 AM   Modules accepted: Orders

## 2016-08-30 NOTE — Patient Instructions (Signed)
Steps to Quit Smoking Smoking tobacco can be bad for your health. It can also affect almost every organ in your body. Smoking puts you and people around you at risk for many serious long-lasting (chronic) diseases. Quitting smoking is hard, but it is one of the best things that you can do for your health. It is never too late to quit. What are the benefits of quitting smoking? When you quit smoking, you lower your risk for getting serious diseases and conditions. They can include:  Lung cancer or lung disease.  Heart disease.  Stroke.  Heart attack.  Not being able to have children (infertility).  Weak bones (osteoporosis) and broken bones (fractures). If you have coughing, wheezing, and shortness of breath, those symptoms may get better when you quit. You may also get sick less often. If you are pregnant, quitting smoking can help to lower your chances of having a baby of low birth weight. What can I do to help me quit smoking? Talk with your doctor about what can help you quit smoking. Some things you can do (strategies) include:  Quitting smoking totally, instead of slowly cutting back how much you smoke over a period of time.  Going to in-person counseling. You are more likely to quit if you go to many counseling sessions.  Using resources and support systems, such as:  Online chats with a counselor.  Phone quitlines.  Printed self-help materials.  Support groups or group counseling.  Text messaging programs.  Mobile phone apps or applications.  Taking medicines. Some of these medicines may have nicotine in them. If you are pregnant or breastfeeding, do not take any medicines to quit smoking unless your doctor says it is okay. Talk with your doctor about counseling or other things that can help you. Talk with your doctor about using more than one strategy at the same time, such as taking medicines while you are also going to in-person counseling. This can help make quitting  easier. What things can I do to make it easier to quit? Quitting smoking might feel very hard at first, but there is a lot that you can do to make it easier. Take these steps:  Talk to your family and friends. Ask them to support and encourage you.  Call phone quitlines, reach out to support groups, or work with a counselor.  Ask people who smoke to not smoke around you.  Avoid places that make you want (trigger) to smoke, such as:  Bars.  Parties.  Smoke-break areas at work.  Spend time with people who do not smoke.  Lower the stress in your life. Stress can make you want to smoke. Try these things to help your stress:  Getting regular exercise.  Deep-breathing exercises.  Yoga.  Meditating.  Doing a body scan. To do this, close your eyes, focus on one area of your body at a time from head to toe, and notice which parts of your body are tense. Try to relax the muscles in those areas.  Download or buy apps on your mobile phone or tablet that can help you stick to your quit plan. There are many free apps, such as QuitGuide from the CDC (Centers for Disease Control and Prevention). You can find more support from smokefree.gov and other websites. This information is not intended to replace advice given to you by your health care provider. Make sure you discuss any questions you have with your health care provider. Document Released: 07/15/2009 Document Revised: 05/16/2016 Document   Reviewed: 02/02/2015 Elsevier Interactive Patient Education  2017 Elsevier Inc.  

## 2016-09-02 LAB — CARDIO IQ ADV LIPID AND INFLAMM PNL
Apolipoprotein B: 53 mg/dL (ref 49–103)
Cholesterol, Total: 171 mg/dL (ref <200)
Cholesterol/HDL Ratio: 1.6 calc (ref <5.0)
HDL Cholesterol: 110 mg/dL (ref >50)
LDL Large: 7709 nmol/L (ref 5038–17886)
LDL Medium: 224 nmol/L (ref 121–397)
LDL Particle Number: 1122 nmol/L (ref 1016–2185)
LDL Peak Size: 213.3 Angstrom — ABNORMAL LOW (ref >=218.2)
LDL Small: 240 nmol/L (ref 115–386)
LDL, Calculated: 47 mg/dL (ref <100)
Lipoprotein (a): 11 nmol/L (ref <75)
Lp-PLA2 Activity: 65 nmol/min/mL (ref 50–133)
Non-HDL Cholesterol: 61 mg/dL (calc) (ref <130)
Triglycerides: 67 mg/dL (ref <150)
hs-CRP: 0.9 mg/L

## 2016-09-04 ENCOUNTER — Encounter: Payer: Self-pay | Admitting: Family Medicine

## 2016-09-04 ENCOUNTER — Ambulatory Visit (INDEPENDENT_AMBULATORY_CARE_PROVIDER_SITE_OTHER): Admitting: Family Medicine

## 2016-09-04 ENCOUNTER — Other Ambulatory Visit: Payer: Self-pay | Admitting: Family Medicine

## 2016-09-04 VITALS — BP 150/90 | HR 60 | Temp 97.7°F | Resp 16 | Ht 65.5 in | Wt 126.0 lb

## 2016-09-04 DIAGNOSIS — F909 Attention-deficit hyperactivity disorder, unspecified type: Secondary | ICD-10-CM | POA: Diagnosis not present

## 2016-09-04 DIAGNOSIS — Z0000 Encounter for general adult medical examination without abnormal findings: Secondary | ICD-10-CM

## 2016-09-04 DIAGNOSIS — Z122 Encounter for screening for malignant neoplasm of respiratory organs: Secondary | ICD-10-CM

## 2016-09-04 DIAGNOSIS — Z23 Encounter for immunization: Secondary | ICD-10-CM

## 2016-09-04 DIAGNOSIS — I2510 Atherosclerotic heart disease of native coronary artery without angina pectoris: Secondary | ICD-10-CM

## 2016-09-04 DIAGNOSIS — E039 Hypothyroidism, unspecified: Secondary | ICD-10-CM

## 2016-09-04 MED ORDER — TRIAMCINOLONE ACETONIDE 0.1 % EX CREA: 1.0000 "application " | TOPICAL_CREAM | Freq: Two times a day (BID) | CUTANEOUS | 0 refills | Status: DC

## 2016-09-04 MED ORDER — AMPHETAMINE-DEXTROAMPHETAMINE 10 MG PO TABS: 10.0000 mg | ORAL_TABLET | Freq: Two times a day (BID) | ORAL | 0 refills | Status: DC

## 2016-09-04 MED ORDER — NAPROXEN 375 MG PO TABS: ORAL_TABLET | ORAL | 3 refills | Status: DC

## 2016-09-04 NOTE — Progress Notes (Signed)
Subjective:    Patient ID: [name], female    DOB: Oct 31, 1955, 60 y.o.   MRN: [id number]  HPI Patient is here today for complete physical exam. Colonoscopy was performed within last 12 months and is therefore up-to-date. Mammogram was performed this summer and is up-to-date. She has a history of a partial abdominal hysterectomy for noncancerous reason and therefore she does not require a Pap smear. Her last bone density was normal in 2015. She is due for a flu shot. Because of her smoking she is already had Pneumovax 23. She is due for the shingles vaccine as well as Prevnar 13. She has a known history of ASCVD with less than 60% stenosis bilaterally in the internal carotid arteries by velocity. She continues to smoke. She is taking Crestor 10 mg every other day. Most recent lab work as listed below [place] on 08/30/2016  Component Date Value Ref Range Status  . Right CCA prox sys 08/30/2016 99  cm/s Final  . Right CCA prox dias 08/30/2016 23  cm/s Final  . Right cca dist sys 08/30/2016 -126  cm/s Final  . Left CCA prox sys 08/30/2016 111  cm/s Final  . Left CCA prox dias 08/30/2016 31  cm/s Final  . Left CCA dist sys 08/30/2016 -74  cm/s Final  . Left CCA dist dias 08/30/2016 -21  cm/s Final  . Left ICA prox sys 08/30/2016 -67  cm/s Final  . Left ICA prox dias 08/30/2016 -23  cm/s Final  . Left ICA dist sys 08/30/2016 -130  cm/s Final  . Left ICA dist dias 08/30/2016 -50  cm/s Final  . RIGHT CCA MID DIAS 08/30/2016 -20.00  cm/s Final  . RIGHT ECA DIAS 08/30/2016 -17.00  cm/s Final  . RIGHT VERTEBRAL DIAS 08/30/2016 -22.00  cm/s Final  . LEFT ECA DIAS 08/30/2016 -16.00  cm/s Final  . LEFT VERTEBRAL DIAS 08/30/2016 -20.00  cm/s Final  Lab on 08/28/2016  Component Date Value Ref Range Status  . WBC 08/29/2016 8.6  3.8 - 10.8 K/uL Final  . RBC 08/29/2016 3.98  3.80 - 5.10 MIL/uL Final  . Hemoglobin 08/29/2016 13.0  12.0 - 15.0 g/dL Final  . HCT 08/29/2016 39.0   35.0 - 45.0 % Final  . MCV 08/29/2016 98.0  80.0 - 100.0 fL Final  . MCH 08/29/2016 32.7  27.0 - 33.0 pg Final  . MCHC 08/29/2016 33.3  32.0 - 36.0 g/dL Final  . RDW 08/29/2016 13.7  11.0 - 15.0 % Final  . Platelets 08/29/2016 556* 140 - 400 K/uL Final  . MPV 08/29/2016 9.9  7.5 - 12.5 fL Final  . Neutro Abs 08/29/2016 5934  1,500 - 7,800 cells/uL Final  . Lymphs Abs 08/29/2016 1634  850 - 3,900 cells/uL Final  . Monocytes Absolute 08/29/2016 602  200 - 950 cells/uL Final  . Eosinophils Absolute 08/29/2016 344  15 - 500 cells/uL Final  . Basophils Absolute 08/29/2016 86  0 - 200 cells/uL Final  . Neutrophils Relative % 08/29/2016 69  % Final  . Lymphocytes Relative 08/29/2016 19  % Final  . Monocytes Relative 08/29/2016 7  % Final  . Eosinophils Relative 08/29/2016 4  % Final  . Basophils Relative 08/29/2016 1  % Final  . Smear Review 08/29/2016 Criteria for review not met   Final  . Sodium 08/29/2016 135  135 - 146 mmol/L Final  . Potassium 08/29/2016 4.0  3.5 - 5.3 mmol/L Final  . Chloride 08/29/2016 99  98 - 110 mmol/L Final  . CO2 08/29/2016 27  20 - 31 mmol/L Final  . Glucose, Bld 08/29/2016 89  70 - 99 mg/dL Final  . BUN 08/29/2016 14  7 - 25 mg/dL Final  . Creat 08/29/2016 0.65  0.50 - 0.99 mg/dL Final   Comment:   For patients > or = 60 years of age: The upper reference limit for Creatinine is approximately 13% higher for people identified as African-American.     . Total Bilirubin 08/29/2016 0.4  0.2 - 1.2 mg/dL Final  . Alkaline Phosphatase 08/29/2016 83  33 - 130 U/L Final  . AST 08/29/2016 27  10 - 35 U/L Final  . ALT 08/29/2016 23  6 - 29 U/L Final  . Total Protein 08/29/2016 6.8  6.1 - 8.1 g/dL Final  . Albumin 08/29/2016 4.4  3.6 - 5.1 g/dL Final  . Calcium 08/29/2016 9.7  8.6 - 10.4 mg/dL Final  . TSH 08/29/2016 1.75  mIU/L Final   Comment:   Reference Range   > or = 20 Years  0.40-4.50   Pregnancy Range First trimester  0.26-2.66 Second trimester  0.55-2.73 Third trimester  0.43-2.91     . LDL Particle Number 09/02/2016 1122  1,016 - 2,185 nmol/L Final   Comment:   Risk: Optimal <1260; Moderate 1260-1538; High [id number]   . LDL Small 09/02/2016 240  115 - 386 nmol/L Final   Comment:   Risk: Optimal <162; Moderate 162-217; High >217   . LDL Medium 09/02/2016 224  121 - 397 nmol/L Final   Comment:   Risk: Optimal <201; Moderate 201-271; High >271   . LDL Large 09/02/2016 7709  5,038 - 17,886 nmol/L Final   Comment:   Risk: Optimal >9386; Moderate 0263-7858; High <6996   . LDL Pattern 09/02/2016 B* A Pattern Final   Comment:   Risk: Optimal Pattern A; High Pattern B   . LDL Peak Size 09/02/2016 213.3* >=218.2 Angstrom Final   Comment:   Risk: Optimal >222.5; Moderate 222.5-218.2; High <218.2   Adult cardiovascular event risk category cut points (optimal, moderate, high) are based on adult [place] reference population. Association between lipoprotein subfractions and cardiovascular events is based on Musunuru et al. Christie Nottingham. 8502;77:4128.   This test was developed and its analytical performance characteristics have been determined by [facility]. It has not been cleared or approved by FDA. This assay has been validated pursuant to the CLIA regulations and is used for clinical purposes.     . Cholesterol, Total 09/02/2016 171  <200 mg/dL Final  . HDL Cholesterol 09/02/2016 110  >50 mg/dL Final  . Triglycerides 09/02/2016 67  <150 mg/dL Final  . Lipoprotein (a) 09/02/2016 11  <75 nmol/L Final   Comment:   Risk: Optimal < 75 nmol/L; Moderate 75-125 nmol/L; High > 125 nmol/L Cardiovascular event risk category cut points (optimal, moderate, high) are based on Marcovina et al. Clin Chem. 7867;67:2094 and Nordestgaard et al. European Heart J. [phone] (results of meta-analysis and expert panel recommendations).   . hs-CRP 09/02/2016 0.9  mg/L Final   Comment:   For Ages > 17  Years:   hs-CRP mg/L   Risk According to AHA/CDC Guidelines -----------   ------------------------------------   <1.0      Lower Relative Cardiovascular Risk.  1.0-3.0    Average Relative Cardiovascular Risk  3.1-10.0   Higher Relative Cardiovascular Risk.             Consider  retesting in 1 to 2 weeks to             exclude a benign transient elevation             in the baseline CRP value secondary to             infection or inflammation.   >10.0     Persistent elevations upon retesting,             may be associated with infection and             inflammation.   Marland Kitchen Apolipoprotein B 09/02/2016 53  49 - 103 mg/dL Final   Comment:   Risk: Optimal < 80 mg/dL; Moderate 80-119 mg/dL; High > or = 120 mg/dL Cardiovascular event risk category cut points (optimal, moderate, high) are based on National Lipid Association recommendations - Lynnell Dike al. J Clin Lipidol. 2011;5:338   . LDL, Calculated 09/02/2016 47  <100 mg/dL Final   Comment:   Desirable range <100 mg/dL for patients with CHD or diabetes and <70 mg/dL for diabetic patients with known heart disease.   LDL-C is now calculated using the Martin-Hopkins calculation, which is a validated novel method providing better accuracy than the Friedewald equation in the estimation of LDL-C. Cresenciano Genre et al. Annamaria Helling. 4098;119(14): 2061-2068 (http://education.[facility].com/faq/FAQ164)   . Cholesterol/HDL Ratio 09/02/2016 1.6  <5.0 calc Final  . Non-HDL Cholesterol 09/02/2016 61  <130 mg/dL (calc) Final   Comment:   For patients with diabetes plus 1 major ASCVD risk factor, treating to a non-HDL-C goal of <100 mg/dL (LDL-C of <70 mg/dL) is considered a therapeutic option.   Marland Kitchen Lp-PLA2 Activity 09/02/2016 65  50 - 133 nmol/min/mL Final   Comment:   Risk: Optimal <=123 nmol/min/mL; High >123 nmol/min/mL.   The Lp-PLA2 Activity test measures the function of the Lp-PLA2 enzyme versus the concentration (mass) of the enzyme. As a  result of the differences in reporting ranges, patient test results from the Lp-PLA2 (PLAC(R)) mass assay cannot be used for direct comparison to the results of the Cardio IQ Lp-PLA2 Activity assay, but for your reference the risk cut points for the discontinued Lp-PLA2 Mass test were Optimal <200 ng/mL; Moderate 200-235 ng/mL; High >235 ng/mL.   This test was developed and its analytical performance characteristics have been determined by [facility]. It has not been cleared or approved by FDA. This assay has been validated pursuant to the CLIA regulations and is used for clinical purposes.    Past Medical History:  Diagnosis Date  . ADHD (attention deficit hyperactivity disorder) 09/07/2011  . Carotid artery occlusion   . Coronary artery disease   . 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 DVT (deep venous thrombosis) ([place]) 09/07/2011  . Hypothyroid 09/07/2011  . Smoker 02/17/2013   Past Surgical History:  Procedure Laterality Date  . ABDOMINAL HYSTERECTOMY    . BREAST LUMPECTOMY  March 2013   Left breast  . EYE SURGERY Right Sept. 2014   Lasix - Laser   Current Outpatient Prescriptions on File Prior to Visit  Medication Sig Dispense Refill  . B Complex-C (B-COMPLEX WITH VITAMIN C) tablet Take 1 tablet by mouth daily.     . cholecalciferol (VITAMIN D) 1000 UNITS tablet Take 1,000 Units by mouth daily.    Marland Kitchen esomeprazole (NEXIUM) 40 MG capsule Take 1 capsule (40 mg total) by mouth 2 (two) times daily before a meal. 180 capsule 3  .  levothyroxine (SYNTHROID, LEVOTHROID) 75 MCG tablet TAKE 1 TABLET DAILY BEFORE BREAKFAST 90 tablet 2  . Multiple Vitamin (MULTIVITAMIN) tablet Take 1 tablet by mouth daily.      . rosuvastatin (CRESTOR) 10 MG tablet TAKE 1 TABLET DAILY (Patient taking differently: TAKE 1 TABLET QOD) 90 tablet 1   No current facility-administered medications on file prior to visit.    Allergies  Allergen  Reactions  . Penicillins Hives   Social History   Social History  . Marital status: Divorced    Spouse name: N/A  . Number of children: N/A  . Years of education: N/A   Occupational History  . Not on file.   Social History Main Topics  . Smoking status: Current Every Day Smoker    Types: E-cigarettes  . Smokeless tobacco: Never Used  . Alcohol use No  . Drug use: No  . Sexual activity: No   Other Topics Concern  . Not on file   Social History Narrative  . No narrative on file   Family History  Problem Relation Age of Onset  . Cancer Father     Stomach  . Crohn's disease Daughter       Review of Systems  All other systems reviewed and are negative.      Objective:   Physical Exam  Constitutional: She is oriented to person, place, and time. She appears well-developed and well-nourished. No distress.  HENT:  Head: Normocephalic and atraumatic.  Right Ear: External ear normal.  Left Ear: External ear normal.  Nose: Nose normal.  Mouth/Throat: Oropharynx is clear and moist. No oropharyngeal exudate.  Eyes: Conjunctivae and EOM are normal. Pupils are equal, round, and reactive to light. Right eye exhibits no discharge. Left eye exhibits no discharge. No scleral icterus.  Neck: Normal range of motion. Neck supple. No JVD present. No tracheal deviation present. No thyromegaly present.  Cardiovascular: Normal rate, regular rhythm, normal heart sounds and intact distal pulses.  Exam reveals no gallop and no friction rub.   No murmur heard. Pulmonary/Chest: Effort normal and breath sounds normal. No stridor. No respiratory distress. She has no wheezes. She has no rales. She exhibits no tenderness.  Abdominal: Soft. Bowel sounds are normal. She exhibits no distension and no mass. There is no tenderness. There is no rebound and no guarding.  Musculoskeletal: Normal range of motion. She exhibits no edema or tenderness.  Lymphadenopathy:    She has no cervical adenopathy.    Neurological: She is alert and oriented to person, place, and time. She has normal reflexes. No cranial nerve deficit. She exhibits normal muscle tone. Coordination normal.  Skin: Skin is warm. No rash noted. She is not diaphoretic. No erythema. No pallor.  Psychiatric: She has a normal mood and affect. Her behavior is normal. Judgment and thought content normal.  Vitals reviewed.         Assessment & Plan:  Routine general medical examination at a health care facility - Plan: Hepatitis C Ab Reflex HCV RNA, QUANT, HIV antibody, Pneumococcal conjugate vaccine 13-valent IM  Attention deficit hyperactivity disorder (ADHD), unspecified ADHD type - Plan: amphetamine-dextroamphetamine (ADDERALL) 10 MG tablet  Need for prophylactic vaccination against Streptococcus pneumoniae (pneumococcus) - Plan: Pneumococcal conjugate vaccine 13-valent IM  Hypothyroidism, unspecified type  Annual physical exam  ASCVD (arteriosclerotic cardiovascular disease)  Encounter for screening for lung cancer - Plan: CT CHEST LUNG CA SCREEN LOW DOSE W/O CM   Patient's physical exam today is normal.  Breast cancer screening is  up-to-date. Colon cancer screening is up-to-date. She has a 30+ pack year history of smoking. She continues to smoke. She is over the age of 62. Therefore she meets consensus criteria for lung cancer screening with low-dose spiral CT scan. I had a long discussion with the patient regarding this and the high false positive rate. Through shared decision-making, the patient elects to proceed with a CT scan of the chest to screen for lung cancer. Also stalked extensively about smoking cessation and even discuss quitting strategies including Chantix. The patient at the present time has no desire to quit. She continues to take naproxen daily for her low back pain. She is on aspirin due to her ASCVD. We discussed the risk of cardiovascular disease as well as gastrointestinal bleeding because of this. The  patient is taking Nexium on a daily basis. Her TSH is excellent. She received Prevnar 13 today in office. I recommended a shingles vaccine but she will check on the price first. Continue to encourage smoking cessation

## 2016-09-05 ENCOUNTER — Encounter: Payer: Self-pay | Admitting: Family Medicine

## 2016-09-05 LAB — HEPATITIS C ANTIBODY: HCV Ab: NEGATIVE

## 2016-09-05 LAB — HIV ANTIBODY (ROUTINE TESTING W REFLEX): HIV 1&2 Ab, 4th Generation: NONREACTIVE

## 2016-09-10 ENCOUNTER — Other Ambulatory Visit: Payer: Self-pay | Admitting: Family Medicine

## 2016-09-12 ENCOUNTER — Ambulatory Visit
Admission: RE | Admit: 2016-09-12 | Discharge: 2016-09-12 | Disposition: A | Discharge status: Home / Self Care | Source: Ambulatory Visit | Attending: Family Medicine | Admitting: Family Medicine

## 2016-09-12 ENCOUNTER — Ambulatory Visit

## 2016-09-12 DIAGNOSIS — Z122 Encounter for screening for malignant neoplasm of respiratory organs: Secondary | ICD-10-CM

## 2016-09-14 ENCOUNTER — Encounter: Payer: Self-pay | Admitting: Family Medicine

## 2016-09-14 DIAGNOSIS — I2510 Atherosclerotic heart disease of native coronary artery without angina pectoris: Secondary | ICD-10-CM | POA: Insufficient documentation

## 2016-09-27 ENCOUNTER — Other Ambulatory Visit: Payer: Self-pay | Admitting: Family Medicine

## 2016-09-27 ENCOUNTER — Encounter: Admitting: Adult Health

## 2016-09-27 DIAGNOSIS — F909 Attention-deficit hyperactivity disorder, unspecified type: Secondary | ICD-10-CM

## 2016-09-27 MED ORDER — AMPHETAMINE-DEXTROAMPHETAMINE 10 MG PO TABS: 10.0000 mg | ORAL_TABLET | Freq: Two times a day (BID) | ORAL | 0 refills | Status: DC

## 2016-09-27 NOTE — Telephone Encounter (Signed)
RX printed x 3 months, left up front and patient aware to pick up via mychart

## 2016-10-09 ENCOUNTER — Ambulatory Visit (HOSPITAL_BASED_OUTPATIENT_CLINIC_OR_DEPARTMENT_OTHER): Admitting: Adult Health

## 2016-10-09 ENCOUNTER — Encounter: Payer: Self-pay | Admitting: Adult Health

## 2016-10-09 VITALS — BP 150/85 | HR 73 | Temp 97.3°F | Resp 18 | Ht 65.5 in | Wt 121.5 lb

## 2016-10-09 DIAGNOSIS — C50412 Malignant neoplasm of upper-outer quadrant of left female breast: Secondary | ICD-10-CM

## 2016-10-09 DIAGNOSIS — Z86000 Personal history of in-situ neoplasm of breast: Secondary | ICD-10-CM | POA: Diagnosis not present

## 2016-10-09 DIAGNOSIS — Z1231 Encounter for screening mammogram for malignant neoplasm of breast: Secondary | ICD-10-CM

## 2016-10-09 DIAGNOSIS — Z170 Estrogen receptor positive status [ER+]: Principal | ICD-10-CM

## 2016-10-09 DIAGNOSIS — Z720 Tobacco use: Secondary | ICD-10-CM

## 2016-10-09 DIAGNOSIS — F17200 Nicotine dependence, unspecified, uncomplicated: Secondary | ICD-10-CM

## 2016-10-09 NOTE — Progress Notes (Signed)
CLINIC:  Survivorship   REASON FOR VISIT:  Routine follow-up for history of breast cancer.   BRIEF ONCOLOGIC HISTORY:    Breast cancer of upper-outer quadrant of left female breast ([place])   01/30/2007 Surgery    Left breast lumpectomy: High-grade DCIS with focal necrosis 1.7 cm EF 54%, PR 74%      03/26/2007 - 05/13/2007 Radiation Therapy    Adjuvant radiation therapy      06/05/2007 - 06/05/2012 Anti-estrogen oral therapy    Arimidex 1 mg daily        INTERVAL HISTORY:  [name] [facility] for routine follow-up for her history of breast cancer.  Overall, she reports feeling quite well.  She completed 5 years of anti-estrogen therapy with Arimidex in 06/2012. Her last screening mammogram was done in 03/2016. She has no breast complaints today; also denies vaginal bleeding.   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  She is trying to avoid surgery for as long as possible. This is a chronic issue and is largely stable.  Otherwise, she is largely without complaints today.   She continues to smoke about 1 pack per day. She does not exercise regularly with a formal regimen, but remains very active helping care for her 80 year old grandson who lives with her.      REVIEW OF SYSTEMS:  Review of Systems  Constitutional: Negative.   HENT:  Negative.   Eyes: Negative.   Respiratory: Negative.   Cardiovascular: Negative.   Gastrointestinal: Negative.   Endocrine: Negative.   Genitourinary: Negative.  Negative for vaginal bleeding.   Musculoskeletal: Positive for back pain.  Neurological: Negative.   Hematological: Negative.   Psychiatric/Behavioral: Negative.    Breast: Denies any new nodularity, masses, tenderness, nipple changes, or nipple discharge.    A 14-point review of systems was completed and was negative, except as noted above.    PAST MEDICAL/SURGICAL  HISTORY:  Past Medical History:  Diagnosis Date  . ADHD (attention deficit hyperactivity disorder) 09/07/2011  . Carotid artery occlusion   . Coronary artery calcification seen on CAT scan   . Coronary artery disease   . 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 DVT (deep venous thrombosis) ([place]) 09/07/2011  . Hypothyroid 09/07/2011  . Smoker 02/17/2013   Past Surgical History:  Procedure Laterality Date  . ABDOMINAL HYSTERECTOMY    . BREAST LUMPECTOMY  March 2013   Left breast  . EYE SURGERY Right Sept. 2014   Lasix - Laser     ALLERGIES:  Allergies  Allergen Reactions  . Penicillins Hives     CURRENT MEDICATIONS:  Outpatient Encounter Prescriptions as of 10/09/2016  Medication Sig  . [START ON 11/27/2016] amphetamine-dextroamphetamine (ADDERALL) 10 MG tablet Take 1 tablet (10 mg total) by mouth 2 (two) times daily.  . B Complex-C (B-COMPLEX WITH VITAMIN C) tablet Take 1 tablet by mouth daily.   . cholecalciferol (VITAMIN D) 1000 UNITS tablet Take 1,000 Units by mouth daily.  Marland Kitchen esomeprazole (NEXIUM) 40 MG capsule Take 1 capsule (40 mg total) by mouth 2 (two) times daily before a meal.  . levothyroxine (SYNTHROID, LEVOTHROID) 75 MCG tablet TAKE 1 TABLET DAILY BEFORE BREAKFAST  . Multiple Vitamin (MULTIVITAMIN) tablet Take 1 tablet by mouth daily.    . naproxen (NAPROSYN) 375 MG tablet TAKE 1 TABLET TWO TIMES DAILY WITH A MEAL  . rosuvastatin (  CRESTOR) 10 MG tablet TAKE 1 TABLET DAILY (Patient taking differently: TAKE 1 TABLET QOD)  . triamcinolone cream (KENALOG) 0.1 % Apply 1 application topically 2 (two) times daily.   No facility-administered encounter medications on file as of 10/09/2016.      ONCOLOGIC FAMILY HISTORY:  Family History  Problem Relation Age of Onset  . Cancer Father     Stomach  . Crohn's disease Daughter     GENETIC COUNSELING/TESTING: No records available for review.  SOCIAL HISTORY:  [name] lives in [place], [place].  She  has 2 adult daughters, and one of these daughters lives with her. She has 2 grandchildren; one grandson H7 and 1 granddaughter age 61. She works for [employer] in [employer]; she has worked there for the last 7 years. She currently smokes cigarettes about 1 pack per day; she has smoked about 1 pack per day  40 years. (40 pack year history).    PHYSICAL EXAMINATION:  Vital Signs: Vitals:   10/09/16 0842  BP: (!) 150/85  Pulse: 73  Resp: 18  Temp: 97.3 F (36.3 C)   Filed Weights   10/09/16 0842  Weight: 121 lb 8 oz (55.1 kg)   General: Well-nourished, well-appearing female in no acute distress.  Unaccompanied today.   HEENT: Head is normocephalic.  Pupils equal and reactive to light. Conjunctivae clear without exudate.  Sclerae anicteric. Oral mucosa is pink, moist.  Oropharynx is pink without lesions or erythema.  Lymph: No cervical, supraclavicular, or infraclavicular lymphadenopathy noted on palpation.  Cardiovascular: Regular rate and rhythm.Marland Kitchen Respiratory: Clear to auscultation bilaterally. Chest expansion symmetric; breathing non-labored.  Breast Exam:  -Left breast: No appreciable masses on palpation. No skin redness, thickening, or peau d'orange appearance; no nipple retraction or nipple discharge; mild distortion in symmetry at previous lumpectomy site; healed scar without erythema or nodularity.  -Right breast: No appreciable masses on palpation. No skin redness, thickening, or peau d'orange appearance; no nipple retraction or nipple discharge. -Axilla: No axillary adenopathy bilaterally.  GI: Abdomen soft and round; non-tender, non-distended. Bowel sounds normoactive. No hepatosplenomegaly.   GU: Deferred.  Neuro: No focal deficits. Steady gait.  Psych: Mood and affect normal and appropriate for situation.  Extremities: No edema. Skin: Warm and dry.  LABORATORY DATA:  None for this visit.   DIAGNOSTIC IMAGING:  Most recent mammogram: 03/16/16       ASSESSMENT AND PLAN:  [name] is a pleasant 61 y.o. female with history of Stage 0 left breast DCIS, ER+/PR+, diagnosed in 01/2007; treated with lumpectomy, adjuvant radiation therapy, and anti-estrogen therapy with Arimidex x 5 years from 06/2007-06/2012.  [facility] and routine follow-up.   1. History of Stage 0 left breast cancer:  [name] is currently clinically and radiographically without evidence of disease or recurrence of breast cancer. She will be due for mammogram in 03/2017; orders placed today.  We discussed that she is now nearly 10 years out from her breast cancer diagnosis with no signs of recurrence, which is very favorable. We discussed the option of having her "graduate" from follow-up here at the cancer center.  However, she tells me that her PCP does not routinely do breast exams and she does not see a gynecologist.  Therefore, she feels more comfortable continuing to come to the cancer center once per year for continued surveillance, which is very reasonable.   She will return to the cancer center to see Survivorship  NP in 10/2017.  I encouraged her to call me with any questions or concerns before her next visit at the cancer center, and I would be happy to see her sooner, if needed.    2. Tobacco use disorder: We discussed the pathophysiology of nicotine dependence and different strategies to stop smoking.  I shared with her that there is limited data to suggest that e-cigarettes help in smoking cessation efforts; we do know that many of the flavorings in e-cigarettes have known carcinogens.  The gold standard of tobacco cessation is nicotine replacement (with nicotine patches & gum/lozenges) or Varenicline (Chantix).  We discussed that the typical craving/urge to smoke lasts about 3-5 minutes; her biggest trigger to use cigarettes is smoking in her care.  Encouraged her to set some new "rules" that do not allow her to smoke in the car;  instead she could use a piece of nicotine gum or a lozenge when she has a craving. I gave her instructions on how to use these nicotine replacement products.  Given her likely high dependence (from smoking 1 ppd x 40 years), she will need continued support with regards to smoking cessation.  [name]  understands that data suggests that "cold [place]" is the least effective way to stop using tobacco products.  Having a clear "quit plan" is much more effective and requires a step-wise approach with continued support from a tobacco treatment specialist.  I encouraged her to reach out to me if she has further questions or interest in her continued cessation efforts.  She has had lung cancer screening with low-dose CT in 09/2016, which was negative for any suspicious nodules/masses.  Greater than 5 minutes was spent in smoking cessation counseling with this patient.     3. Bone health:  Given [name]'s age, history of breast cancer, and herprevious anti-estrogen therapy with Arimidex x 5 years, she is at risk for bone demineralization. Her last DEXA scan was on 09/11/14 and was normal.  Since she has completed anti-estrogen therapy that can weaken bone density, I will defer any future DEXA scan imaging to her PCP, as clinically indicated.  In the meantime, she was encouraged to increase her consumption of foods rich in calcium, as well as increase her weight-bearing activities.  She was given education on specific food and activities to promote bone health.     Dispo:  -Mammogram at [facility] in 03/2017. -Return to cancer center to see Survivorship NP in 10/2017.   A total of 20 minutes of face-to-face time was spent with this patient with greater than 50% of that time in counseling and care-coordination.   Mike Craze, NP Survivorship Program [facility] [phone]   Note: PRIMARY CARE PROVIDER Odette Fraction, [place] [phone]

## 2016-12-05 ENCOUNTER — Other Ambulatory Visit: Payer: Self-pay | Admitting: Family Medicine

## 2016-12-05 ENCOUNTER — Encounter: Payer: Self-pay | Admitting: Family Medicine

## 2016-12-05 DIAGNOSIS — F909 Attention-deficit hyperactivity disorder, unspecified type: Secondary | ICD-10-CM

## 2016-12-06 MED ORDER — AMPHETAMINE-DEXTROAMPHETAMINE 10 MG PO TABS: 10.0000 mg | ORAL_TABLET | Freq: Two times a day (BID) | ORAL | 0 refills | Status: DC

## 2016-12-06 NOTE — Telephone Encounter (Signed)
RX's x 3 months printed, left up front and patient aware to pick up tomorrow 12/07/16 via [employer]

## 2016-12-14 MED ORDER — AMPHETAMINE-DEXTROAMPHETAMINE 10 MG PO TABS: 10.0000 mg | ORAL_TABLET | Freq: Two times a day (BID) | ORAL | 0 refills | Status: DC

## 2016-12-14 NOTE — Addendum Note (Signed)
Addended by: Sheral Flow on: 12/14/2016 10:17 AM   Modules accepted: Orders

## 2016-12-18 MED ORDER — AMPHETAMINE-DEXTROAMPHETAMINE 10 MG PO TABS: 10.0000 mg | ORAL_TABLET | Freq: Two times a day (BID) | ORAL | 0 refills | Status: DC

## 2016-12-18 NOTE — Addendum Note (Signed)
Addended by: Sheral Flow on: 12/18/2016 08:25 AM   Modules accepted: Orders

## 2016-12-30 ENCOUNTER — Other Ambulatory Visit: Payer: Self-pay | Admitting: Family Medicine

## 2017-01-26 ENCOUNTER — Other Ambulatory Visit: Payer: Self-pay | Admitting: Family Medicine

## 2017-01-29 ENCOUNTER — Encounter: Payer: Self-pay | Admitting: Family Medicine

## 2017-01-29 DIAGNOSIS — R053 Chronic cough: Secondary | ICD-10-CM

## 2017-01-29 DIAGNOSIS — R05 Cough: Secondary | ICD-10-CM

## 2017-01-31 MED ORDER — PANTOPRAZOLE SODIUM 40 MG PO TBEC: 40.0000 mg | DELAYED_RELEASE_TABLET | Freq: Every day | ORAL | 3 refills | Status: DC

## 2017-01-31 NOTE — Telephone Encounter (Signed)
Pt sent email stating the following: One more question, I have my annual cold/bronchitis or worse!I have been blowing/coughing green for pushing 2 weeks (sorry to be gross!!).It is so difficult to get off of work lately to come see you, is there any way I can run by a [facility] facility for a chest x-ray or can I get an antibiotic called in?What is your suggestion?

## 2017-02-02 ENCOUNTER — Encounter: Payer: Self-pay | Admitting: Family Medicine

## 2017-02-02 ENCOUNTER — Ambulatory Visit
Admission: RE | Admit: 2017-02-02 | Discharge: 2017-02-02 | Disposition: A | Discharge status: Home / Self Care | Source: Ambulatory Visit | Attending: Family Medicine | Admitting: Family Medicine

## 2017-02-02 DIAGNOSIS — R053 Chronic cough: Secondary | ICD-10-CM

## 2017-02-02 DIAGNOSIS — R05 Cough: Secondary | ICD-10-CM

## 2017-03-05 ENCOUNTER — Telehealth: Payer: Self-pay | Admitting: Family Medicine

## 2017-03-05 DIAGNOSIS — F909 Attention-deficit hyperactivity disorder, unspecified type: Secondary | ICD-10-CM

## 2017-03-05 NOTE — Telephone Encounter (Signed)
Ok to refill x 3 month??      (she knows it is early but she goes to [facility] to take care of her aging mother and needs to take them with her - we do this for her)

## 2017-03-05 NOTE — Telephone Encounter (Signed)
Patient calling for rx for adderall, also has questions if you could call her before hand  [phone]

## 2017-03-05 NOTE — Telephone Encounter (Signed)
Okay to refill? 

## 2017-03-06 MED ORDER — AMPHETAMINE-DEXTROAMPHETAMINE 10 MG PO TABS: 10.0000 mg | ORAL_TABLET | Freq: Two times a day (BID) | ORAL | 0 refills | Status: DC

## 2017-03-06 NOTE — Telephone Encounter (Signed)
RX printed, left up front and patient aware to pick up via mychart 

## 2017-03-19 ENCOUNTER — Ambulatory Visit

## 2017-03-22 ENCOUNTER — Other Ambulatory Visit: Payer: Self-pay | Admitting: Family Medicine

## 2017-03-29 ENCOUNTER — Ambulatory Visit

## 2017-04-10 ENCOUNTER — Ambulatory Visit
Admission: RE | Admit: 2017-04-10 | Discharge: 2017-04-10 | Disposition: A | Discharge status: Home / Self Care | Source: Ambulatory Visit | Attending: Adult Health | Admitting: Adult Health

## 2017-04-10 DIAGNOSIS — Z1231 Encounter for screening mammogram for malignant neoplasm of breast: Secondary | ICD-10-CM

## 2017-04-26 ENCOUNTER — Other Ambulatory Visit: Payer: Self-pay | Admitting: Family Medicine

## 2017-06-08 ENCOUNTER — Other Ambulatory Visit: Payer: Self-pay | Admitting: Family Medicine

## 2017-06-08 DIAGNOSIS — F909 Attention-deficit hyperactivity disorder, unspecified type: Secondary | ICD-10-CM

## 2017-06-08 MED ORDER — AMPHETAMINE-DEXTROAMPHETAMINE 10 MG PO TABS: 10.0000 mg | ORAL_TABLET | Freq: Two times a day (BID) | ORAL | 0 refills | Status: DC

## 2017-06-28 ENCOUNTER — Other Ambulatory Visit: Payer: Self-pay | Admitting: Family Medicine

## 2017-07-10 MED FILL — DEXTROAMP-AMP 10 MG TAB: 10 | 30 days supply | Qty: 60 | Fill #0

## 2017-08-07 ENCOUNTER — Encounter: Payer: Self-pay | Admitting: Family Medicine

## 2017-08-20 ENCOUNTER — Other Ambulatory Visit: Payer: Self-pay | Admitting: Family Medicine

## 2017-08-20 DIAGNOSIS — F909 Attention-deficit hyperactivity disorder, unspecified type: Secondary | ICD-10-CM

## 2017-08-21 MED ORDER — AMPHETAMINE-DEXTROAMPHETAMINE 10 MG PO TABS: 10.0000 mg | ORAL_TABLET | Freq: Two times a day (BID) | ORAL | 0 refills | Status: DC

## 2017-08-21 NOTE — Telephone Encounter (Signed)
ok 

## 2017-08-21 NOTE — Telephone Encounter (Signed)
Ok to refill x 3 months??       

## 2017-08-27 ENCOUNTER — Encounter: Payer: Self-pay | Admitting: Family Medicine

## 2017-08-27 MED ORDER — PANTOPRAZOLE SODIUM 40 MG PO TBEC: 40.0000 mg | DELAYED_RELEASE_TABLET | Freq: Two times a day (BID) | ORAL | 3 refills | Status: DC

## 2017-08-31 ENCOUNTER — Encounter (HOSPITAL_COMMUNITY)

## 2017-08-31 ENCOUNTER — Ambulatory Visit: Admitting: Family

## 2017-09-05 ENCOUNTER — Encounter (HOSPITAL_COMMUNITY)

## 2017-09-05 ENCOUNTER — Ambulatory Visit: Admitting: Family

## 2017-10-05 DIAGNOSIS — G5601 Carpal tunnel syndrome, right upper limb: Secondary | ICD-10-CM | POA: Insufficient documentation

## 2017-10-10 ENCOUNTER — Other Ambulatory Visit

## 2017-10-10 DIAGNOSIS — E038 Other specified hypothyroidism: Secondary | ICD-10-CM

## 2017-10-10 DIAGNOSIS — Z0000 Encounter for general adult medical examination without abnormal findings: Secondary | ICD-10-CM

## 2017-10-10 DIAGNOSIS — I2510 Atherosclerotic heart disease of native coronary artery without angina pectoris: Secondary | ICD-10-CM

## 2017-10-10 LAB — COMPREHENSIVE METABOLIC PANEL
AG Ratio: 1.8 (calc) (ref 1.0–2.5)
ALT: 19 U/L (ref 6–29)
AST: 22 U/L (ref 10–35)
Albumin: 4.3 g/dL (ref 3.6–5.1)
Alkaline phosphatase (APISO): 88 U/L (ref 33–130)
BUN: 13 mg/dL (ref 7–25)
CO2: 25 mmol/L (ref 20–32)
Calcium: 10.1 mg/dL (ref 8.6–10.4)
Chloride: 100 mmol/L (ref 98–110)
Creat: 0.59 mg/dL (ref 0.50–0.99)
Globulin: 2.4 g/dL (calc) (ref 1.9–3.7)
Glucose, Bld: 95 mg/dL (ref 65–99)
Potassium: 4.7 mmol/L (ref 3.5–5.3)
Sodium: 135 mmol/L (ref 135–146)
Total Bilirubin: 0.4 mg/dL (ref 0.2–1.2)
Total Protein: 6.7 g/dL (ref 6.1–8.1)

## 2017-10-10 LAB — LIPID PANEL
Cholesterol: 146 mg/dL (ref <200)
HDL: 86 mg/dL (ref >50)
LDL Cholesterol (Calc): 43 mg/dL (calc)
Non-HDL Cholesterol (Calc): 60 mg/dL (calc) (ref <130)
Total CHOL/HDL Ratio: 1.7 (calc) (ref <5.0)
Triglycerides: 85 mg/dL (ref <150)

## 2017-10-10 LAB — CBC WITH DIFFERENTIAL/PLATELET
Basophils Absolute: 77 cells/uL (ref 0–200)
Basophils Relative: 0.9 %
Eosinophils Absolute: 275 cells/uL (ref 15–500)
Eosinophils Relative: 3.2 %
HCT: 38.1 % (ref 35.0–45.0)
Hemoglobin: 13 g/dL (ref 11.7–15.5)
Lymphs Abs: 1643 cells/uL (ref 850–3900)
MCH: 32.3 pg (ref 27.0–33.0)
MCHC: 34.1 g/dL (ref 32.0–36.0)
MCV: 94.8 fL (ref 80.0–100.0)
MPV: 10.3 fL (ref 7.5–12.5)
Monocytes Relative: 6.3 %
Neutro Abs: 6063 cells/uL (ref 1500–7800)
Neutrophils Relative %: 70.5 %
Platelets: 518 10*3/uL — ABNORMAL HIGH (ref 140–400)
RBC: 4.02 10*6/uL (ref 3.80–5.10)
RDW: 12.1 % (ref 11.0–15.0)
Total Lymphocyte: 19.1 %
WBC mixed population: 542 cells/uL (ref 200–950)
WBC: 8.6 10*3/uL (ref 3.8–10.8)

## 2017-10-10 LAB — TSH: TSH: 1.51 mIU/L (ref 0.40–4.50)

## 2017-10-11 ENCOUNTER — Encounter: Admitting: Adult Health

## 2017-10-12 ENCOUNTER — Inpatient Hospital Stay: Attending: Adult Health | Admitting: Adult Health

## 2017-10-12 ENCOUNTER — Encounter: Payer: Self-pay | Admitting: Adult Health

## 2017-10-12 VITALS — BP 148/83 | HR 96 | Temp 98.3°F | Resp 20 | Ht 65.5 in | Wt 124.4 lb

## 2017-10-12 DIAGNOSIS — Z853 Personal history of malignant neoplasm of breast: Secondary | ICD-10-CM

## 2017-10-12 DIAGNOSIS — Z1239 Encounter for other screening for malignant neoplasm of breast: Secondary | ICD-10-CM

## 2017-10-12 NOTE — Progress Notes (Signed)
CLINIC:  Survivorship   REASON FOR VISIT:  Routine follow-up for history of breast cancer.   BRIEF ONCOLOGIC HISTORY:    Breast cancer of upper-outer quadrant of left female breast ([place])   01/30/2007 Surgery    Left breast lumpectomy: High-grade DCIS with focal necrosis 1.7 cm ER 54%, PR 74%      03/26/2007 - 05/13/2007 Radiation Therapy    Adjuvant radiation therapy      06/05/2007 - 06/05/2012 Anti-estrogen oral therapy    Arimidex 1 mg daily        INTERVAL HISTORY:  [name] presents to the [facility] today for routine follow-up for her history of breast cancer.  Overall, she reports feeling quite well.  She has a PCP, Dr. Dennard Schaumann that she sees regularly.  She exercises regularly.      REVIEW OF SYSTEMS:  Review of Systems  Constitutional: Negative for appetite change, chills, fatigue, fever and unexpected weight change.  HENT:   Negative for hearing loss, lump/mass and sore throat.   Eyes: Negative for eye problems and icterus.  Respiratory: Negative for chest tightness, cough and shortness of breath.   Cardiovascular: Negative for chest pain, leg swelling and palpitations.  Gastrointestinal: Negative for abdominal distention, abdominal pain, blood in stool, constipation, diarrhea, nausea and vomiting.  Endocrine: Negative for hot flashes.  Genitourinary: Negative for difficulty urinating.   Musculoskeletal: Negative for arthralgias.  Skin: Negative for itching and rash.  Neurological: Negative for dizziness, extremity weakness, headaches and numbness.  Hematological: Negative for adenopathy. Does not bruise/bleed easily.  Psychiatric/Behavioral: Negative for depression. The patient is not nervous/anxious.   Breast: Denies any new nodularity, masses, tenderness, nipple changes, or nipple discharge.       PAST MEDICAL/SURGICAL HISTORY:  Past Medical History:  Diagnosis Date  . ADHD (attention deficit hyperactivity disorder) 09/07/2011  . Carotid artery  occlusion   . Coronary artery calcification seen on CAT scan   . Coronary artery disease   . 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 DVT (deep venous thrombosis) ([place]) 09/07/2011  . Hypothyroid 09/07/2011  . Smoker 02/17/2013   Past Surgical History:  Procedure Laterality Date  . ABDOMINAL HYSTERECTOMY    . BREAST LUMPECTOMY  March 2013   Left breast  . EYE SURGERY Right Sept. 2014   Lasix - Laser     ALLERGIES:  Allergies  Allergen Reactions  . Penicillins Hives     CURRENT MEDICATIONS:  Outpatient Encounter Medications as of 10/12/2017  Medication Sig  . amphetamine-dextroamphetamine (ADDERALL) 10 MG tablet Take 1 tablet (10 mg total) by mouth 2 (two) times daily.  . B Complex-C (B-COMPLEX WITH VITAMIN C) tablet Take 1 tablet by mouth daily.   . cholecalciferol (VITAMIN D) 1000 UNITS tablet Take 1,000 Units by mouth daily.  Marland Kitchen levothyroxine (SYNTHROID, LEVOTHROID) 75 MCG tablet TAKE 1 TABLET DAILY BEFORE BREAKFAST  . Multiple Vitamin (MULTIVITAMIN) tablet Take 1 tablet by mouth daily.    . naproxen (NAPROSYN) 375 MG tablet TAKE 1 TABLET TWO TIMES DAILY WITH A MEAL  . pantoprazole (PROTONIX) 40 MG tablet Take 1 tablet (40 mg total) by mouth 2 (two) times daily.  . rosuvastatin (CRESTOR) 10 MG tablet TAKE 1 TABLET DAILY  . [DISCONTINUED] esomeprazole (NEXIUM) 40 MG capsule Take 1 capsule (40 mg total) by mouth 2 (two) times daily before a meal. (Patient not taking: Reported on 10/12/2017)  . [DISCONTINUED] triamcinolone cream (KENALOG) 0.1 % APPLY ONE APPLICATION TOPICALLY TWICE DAILY (Patient not  taking: Reported on 10/12/2017)   No facility-administered encounter medications on file as of 10/12/2017.      ONCOLOGIC FAMILY HISTORY:  Family History  Problem Relation Age of Onset  . Cancer Father        Stomach  . Crohn's disease Daughter     GENETIC COUNSELING/TESTING: Not at this time  SOCIAL HISTORY:  [name] is divorced and lives with her  daughter and grandson in [place], [place].  She has another daughter who lives on her own in [place].  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  She denies any current or history of or illicit drug use.  She is a current every day smoker of 1ppd for 30+ years.  Not ready to quit today.  Occasional drink of beer.     PHYSICAL EXAMINATION:  Vital Signs: Vitals:   10/12/17 1510  BP: (!) 148/83  Pulse: 96  Resp: 20  Temp: 98.3 F (36.8 C)  SpO2: 98%   Filed Weights   10/12/17 1510  Weight: 124 lb 6.4 oz (56.4 kg)   General: Well-nourished, well-appearing female in no acute distress.  Unaccompanied today.   HEENT: Head is normocephalic.  Pupils equal and reactive to light. Conjunctivae clear without exudate.  Sclerae anicteric. Oral mucosa is pink, moist.  Oropharynx is pink without lesions or erythema.  Lymph: No cervical, supraclavicular, or infraclavicular lymphadenopathy noted on palpation.  Cardiovascular: Regular rate and rhythm.Marland Kitchen Respiratory: Clear to auscultation bilaterally. Chest expansion symmetric; breathing non-labored.  Breast Exam:  -Left breast: No appreciable masses on palpation. No skin redness, thickening, or peau d'orange appearance; no nipple retraction or nipple discharge; mild distortion in symmetry at previous lumpectomy site well healed scar without erythema or nodularity.  -Right breast: No appreciable masses on palpation. No skin redness, thickening, or peau d'orange appearance; no nipple retraction or nipple discharge; -Axilla: No axillary adenopathy bilaterally.  GI: Abdomen soft and round; non-tender, non-distended. Bowel sounds normoactive. No hepatosplenomegaly.   GU: Deferred.  Neuro: No focal deficits. Steady gait.  Psych: Mood and affect normal and appropriate for situation.  MSK: No focal spinal tenderness to palpation, full range of motion in bilateral upper  extremities Extremities: No edema. Skin: Warm and dry.  LABORATORY DATA:  None for this visit   DIAGNOSTIC IMAGING:  Most recent mammogram:      ASSESSMENT AND PLAN:  Ms.. Ryan is a pleasant 62 y.o. 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  She [facility] the [facility] for surveillance and routine follow-up.   1. History of breast cancer:  Ms. Gaulin is currently clinically and radiographically without evidence of disease or recurrence of breast cancer. She will be due for mammogram in 04/2018; orders placed today.   I encouraged her to call me with any questions or concerns before her next visit at the cancer center, and I would be happy to see her sooner, if needed.    2. Bone health:  Given Ms. Newbrough's age, history of breast cancer, and her previous anti-estrogen therapy with Anastrozole, she is at risk for bone demineralization. Her last DEXA scan was in 2015 and was normal.  I will defer to her PCP regarding bone density testing and management as she is no longer on anti estrogen therapy.  She was  given education on specific food and activities to promote bone health.  3. Cancer screening:  Due to Ms. Ingerson's history and her age, she should receive screening for skin cancers, colon cancer, lung cancer and gynecologic cancers. She was encouraged to follow-up with her PCP for appropriate cancer screenings.   4. Health maintenance and wellness promotion: Ms. Mallozzi was encouraged to consume 5-7 servings of fruits and vegetables per day. She was also encouraged to engage in moderate to vigorous exercise for 30 minutes per day most days of the week. She was instructed to limit her alcohol consumption and stop smoking.    Dispo:  -Return to cancer center in one year for LTS follow up -Mammogram in 04/2018   A  total of (30) minutes of face-to-face time was spent with this patient with greater than 50% of that time in counseling and care-coordination.   Gardenia Phlegm, NP Survivorship Program [facility] [phone]   Note: PRIMARY CARE PROVIDER [name], [place] [phone]

## 2017-10-16 ENCOUNTER — Telehealth: Payer: Self-pay | Admitting: Adult Health

## 2017-10-16 LAB — CARDIO IQ(R) ADVANCED LIPID PANEL
Apolipoprotein B: 53 mg/dL (ref 49–103)
Cholesterol: 157 mg/dL (ref <200)
HDL: 88 mg/dL (ref >50)
LDL Cholesterol (Calc): 52 mg/dL (ref <100)
LDL Large: 7175 nmol/L (ref 3966–11938)
LDL Medium: 160 nmol/L (ref 122–498)
LDL Particle Number: 864 nmol/L (ref 732–2035)
LDL Peak Size: 216.8 Angstrom — ABNORMAL LOW (ref >=217)
LDL Small: 147 nmol/L (ref 75–452)
Lipoprotein (a): 21 nmol/L (ref <75)
Non-HDL Cholesterol (Calc): 69 mg/dL (calc) (ref <130)
Total CHOL/HDL Ratio: 1.8 calc (ref <5.0)
Triglycerides: 86 mg/dL (ref <150)

## 2017-10-16 NOTE — Telephone Encounter (Signed)
Spoke with patient re January 2019. Patent to arrange annual mammo.

## 2017-10-18 ENCOUNTER — Other Ambulatory Visit: Payer: Self-pay

## 2017-10-18 ENCOUNTER — Encounter: Payer: Self-pay | Admitting: Family

## 2017-10-18 ENCOUNTER — Ambulatory Visit (INDEPENDENT_AMBULATORY_CARE_PROVIDER_SITE_OTHER): Admitting: Family

## 2017-10-18 ENCOUNTER — Other Ambulatory Visit: Payer: Self-pay | Admitting: Family Medicine

## 2017-10-18 ENCOUNTER — Ambulatory Visit (HOSPITAL_COMMUNITY)
Admission: RE | Admit: 2017-10-18 | Discharge: 2017-10-18 | Disposition: A | Discharge status: Home / Self Care | Source: Ambulatory Visit | Attending: Family | Admitting: Family

## 2017-10-18 VITALS — BP 170/92 | HR 79 | Temp 97.6°F | Resp 16 | Ht 65.5 in | Wt 122.0 lb

## 2017-10-18 DIAGNOSIS — I773 Arterial fibromuscular dysplasia: Secondary | ICD-10-CM

## 2017-10-18 DIAGNOSIS — F17200 Nicotine dependence, unspecified, uncomplicated: Secondary | ICD-10-CM | POA: Diagnosis not present

## 2017-10-18 LAB — VAS US CAROTID
LEFT ECA DIAS: -21 cm/s
LEFT VERTEBRAL DIAS: 15 cm/s
Left CCA dist dias: -22 cm/s
Left CCA dist sys: -64 cm/s
Left CCA prox dias: 36 cm/s
Left CCA prox sys: 120 cm/s
Left ICA dist dias: -56 cm/s
Left ICA dist sys: -130 cm/s
Left ICA prox dias: -28 cm/s
Left ICA prox sys: -77 cm/s
RIGHT CCA MID DIAS: -20 cm/s
RIGHT ECA DIAS: -21 cm/s
RIGHT VERTEBRAL DIAS: -22 cm/s
Right CCA prox dias: 18 cm/s
Right CCA prox sys: 79 cm/s
Right cca dist sys: -98 cm/s

## 2017-10-18 NOTE — Progress Notes (Signed)
Chief Complaint: Follow up Extracranial Carotid Artery Stenosis   History of Present Illness  [name] is a 62 y.o. female whom Dr. 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  About 2009 she had a syncopal episode, was evaluated at [facility], and states that evaluation was inconclusive for TIA or stroke. She denies headaches, denies postprandial abdominal pain.  Pt does not seem to have claudication symptoms with walking.  Patient has not had previous carotid artery intervention.   She is receiving ESI's for 3 lumbar HNP, 2 thoracic spine HNP. Her legs throb and tingle when she stands from a sitting position.  Pt Diabetic: No Pt smoker: smoker (1 ppd, started at age 23 yrs)  Pt meds include: Statin: yes ASA: Yes Other anticoagulants/antiplatelets: no     Past Medical History:  Diagnosis Date  . ADHD (attention deficit hyperactivity disorder) 09/07/2011  . Carotid artery occlusion   . Coronary artery calcification seen on CAT scan   . Coronary artery disease   . 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 DVT (deep venous thrombosis) ([facility]) 09/07/2011  . Hypothyroid 09/07/2011  . Smoker 02/17/2013    Social History Social History   Tobacco Use  . Smoking status: Current Every Day Smoker    Packs/day: 1.00    Years: 40.00    Pack years: 40.00    Types: E-cigarettes, Cigarettes  . Smokeless tobacco: Never Used  Substance Use Topics  . Alcohol use: No    Alcohol/week: 0.0 oz  . Drug use: No    Family History Family History  Problem Relation Age of Onset  . Cancer Father        Stomach  . Crohn's disease Daughter     Surgical History Past Surgical History:  Procedure Laterality Date  . ABDOMINAL HYSTERECTOMY    . BREAST LUMPECTOMY  March 2013   Left breast  . EYE SURGERY Right Sept. 2014   Lasix - Laser    Allergies  Allergen Reactions  . Penicillins Hives    Current Outpatient  Medications  Medication Sig Dispense Refill  . amphetamine-dextroamphetamine (ADDERALL) 10 MG tablet Take 1 tablet (10 mg total) by mouth 2 (two) times daily. 60 tablet 0  . B Complex-C (B-COMPLEX WITH VITAMIN C) tablet Take 1 tablet by mouth daily.     . cholecalciferol (VITAMIN D) 1000 UNITS tablet Take 1,000 Units by mouth daily.    Marland Kitchen levothyroxine (SYNTHROID, LEVOTHROID) 75 MCG tablet TAKE 1 TABLET DAILY BEFORE BREAKFAST 90 tablet 2  . Multiple Vitamin (MULTIVITAMIN) tablet Take 1 tablet by mouth daily.      . naproxen (NAPROSYN) 375 MG tablet TAKE 1 TABLET TWO TIMES DAILY WITH A MEAL 180 tablet 3  . pantoprazole (PROTONIX) 40 MG tablet Take 1 tablet (40 mg total) by mouth 2 (two) times daily. 180 tablet 3  . rosuvastatin (CRESTOR) 10 MG tablet TAKE 1 TABLET DAILY 90 tablet 1   No current facility-administered medications for this visit.     Review of Systems : See HPI for pertinent positives and negatives.  Physical Examination  Vitals:   10/18/17 0833 10/18/17 0836 10/18/17 0838  BP: (!) 158/85 (!) 151/87 (!) 170/92  Pulse: 80 79 79  Resp: 16    Temp: 97.6 F (36.4 C)    SpO2: 98%    Weight: 122 lb (55.3 kg)    Height: 5' 5.5" (1.664 m)     Body mass index  is 19.99 kg/m.  General: WDWN thin female in NAD GAIT:normal HENT: No gross abnormalities. Voice is slightly husky.  Eyes: PERRLA Pulmonary: Respirations are non-labored, CTAB, good air movement in all fields.   Cardiac: regular rhythm, no detected murmur.  VASCULAR EXAM Carotid Bruits Right Left   Negative Negative    Abdominal aortic pulse is not palpable. Radial pulses are 2+ palpable and equal.      LE Pulses Right Left   POPLITEAL not palpable  1+ palpable   POSTERIOR TIBIAL 1+ palpable  2+ palpable    DORSALIS PEDIS  ANTERIOR  TIBIAL not palpable  faintly palpable     Gastrointestinal: soft, nontender, BS WNL, no r/g,no palpable masses.  Musculoskeletal: No muscle atrophy/wasting. M/S 5/5 throughout, Extremities without ischemic changes.  Skin: No rash, no cellulitis, no ulcers noted.   Neurologic: A&O X 3; appropriate affect,speech is normal, CN 2-12 intact, Pain and light touch intact in extremities, Motor exam as listed above.    Psychiatric: Normal thought content, mood appropriate to clinical situation.       Assessment: [name] is a 62 y.o. female who has bilateral internal carotid artery stenosis that remains consistent with fibromuscular dysplasia. Her blood pressure remains in the normal range. She has no history of stroke or TIA.  Fortunately she does not have DM, but unfortunately she continues to smoke, has a history of breast cancer, see Plan.   She has an appointment tomorrow to see her PCP tomorrow; I advised her to discuss her uncontrolled hypertension.    DATA Carotid Duplex (01/17/190: Right distal ICA: 1-39% stenosis. Left distal ICA: 40-59% stenosis. No plaque visualized. Increased velocities in bilateral disatal ICA consistent with fibromuscular dysplasia.  Bilateral vertebral artery flow is antegrade.  Bilateral subclavian artery waveforms are normal.  No significant change compared to the exam on 08-30-16.    Plan:  The patient was counseled re smoking cessation and given several free resources re smoking cessation.   Follow-up in 1 year with Carotid Duplex scan.   I discussed in depth with the patient the nature of atherosclerosis, and emphasized the importance of maximal medical management including strict control of blood pressure, blood glucose, and lipid levels, obtaining regular exercise, and cessation of smoking.  The patient is aware that without maximal medical management the underlying atherosclerotic disease process will progress, limiting  the benefit of any interventions. The patient was given information about stroke prevention and what symptoms should prompt the patient to seek immediate medical care. Thank you for allowing [place] to participate in this patient's care.  Clemon Chambers, RN, MSN, FNP-C [facility] Office: [phone]  Clinic Physician: Oneida Alar  10/18/17 8:50 AM

## 2017-10-18 NOTE — Patient Instructions (Addendum)
Steps to Quit Smoking Smoking tobacco can be bad for your health. It can also affect almost every organ in your body. Smoking puts you and people around you at risk for many serious long-lasting (chronic) diseases. Quitting smoking is hard, but it is one of the best things that you can do for your health. It is never too late to quit. What are the benefits of quitting smoking? When you quit smoking, you lower your risk for getting serious diseases and conditions. They can include:  Lung cancer or lung disease.  Heart disease.  Stroke.  Heart attack.  Not being able to have children (infertility).  Weak bones (osteoporosis) and broken bones (fractures).  If you have coughing, wheezing, and shortness of breath, those symptoms may get better when you quit. You may also get sick less often. If you are pregnant, quitting smoking can help to lower your chances of having a baby of low birth weight. What can I do to help me quit smoking? Talk with your doctor about what can help you quit smoking. Some things you can do (strategies) include:  Quitting smoking totally, instead of slowly cutting back how much you smoke over a period of time.  Going to in-person counseling. You are more likely to quit if you go to many counseling sessions.  Using resources and support systems, such as: ? Online chats with a counselor. ? Phone quitlines. ? Printed self-help materials. ? Support groups or group counseling. ? Text messaging programs. ? Mobile phone apps or applications.  Taking medicines. Some of these medicines may have nicotine in them. If you are pregnant or breastfeeding, do not take any medicines to quit smoking unless your doctor says it is okay. Talk with your doctor about counseling or other things that can help you.  Talk with your doctor about using more than one strategy at the same time, such as taking medicines while you are also going to in-person counseling. This can help make  quitting easier. What things can I do to make it easier to quit? Quitting smoking might feel very hard at first, but there is a lot that you can do to make it easier. Take these steps:  Talk to your family and friends. Ask them to support and encourage you.  Call phone quitlines, reach out to support groups, or work with a counselor.  Ask people who smoke to not smoke around you.  Avoid places that make you want (trigger) to smoke, such as: ? Bars. ? Parties. ? Smoke-break areas at work.  Spend time with people who do not smoke.  Lower the stress in your life. Stress can make you want to smoke. Try these things to help your stress: ? Getting regular exercise. ? Deep-breathing exercises. ? Yoga. ? Meditating. ? Doing a body scan. To do this, close your eyes, focus on one area of your body at a time from head to toe, and notice which parts of your body are tense. Try to relax the muscles in those areas.  Download or buy apps on your mobile phone or tablet that can help you stick to your quit plan. There are many free apps, such as QuitGuide from the CDC (Centers for Disease Control and Prevention). You can find more support from smokefree.gov and other websites.  This information is not intended to replace advice given to you by your health care provider. Make sure you discuss any questions you have with your health care provider. Document Released: 07/15/2009 Document   Revised: 05/16/2016 Document Reviewed: 02/02/2015 Elsevier Interactive Patient Education  2018 [employer]     Stroke Prevention Some health problems and behaviors may make it more likely for you to have a stroke. Below are ways to lessen your risk of having a stroke.  Be active for at least 30 minutes on most or all days.  Do not smoke. Try not to be around others who smoke.  Do not drink too much alcohol. ? Do not have more than 2 drinks a day if you are a man. ? Do not have more than 1 drink a day if you  are a woman and are not pregnant.  Eat healthy foods, such as fruits and vegetables. If you were put on a specific diet, follow the diet as told.  Keep your cholesterol levels under control through diet and medicines. Look for foods that are low in saturated fat, trans fat, cholesterol, and are high in fiber.  If you have diabetes, follow all diet plans and take your medicine as told.  Ask your doctor if you need treatment to lower your blood pressure. If you have high blood pressure (hypertension), follow all diet plans and take your medicine as told by your doctor.  If you are 18-39 years old, have your blood pressure checked every 3-5 years. If you are age 40 or older, have your blood pressure checked every year.  Keep a healthy weight. Eat foods that are low in calories, salt, saturated fat, trans fat, and cholesterol.  Do not take drugs.  Avoid birth control pills, if this applies. Talk to your doctor about the risks of taking birth control pills.  Talk to your doctor if you have sleep problems (sleep apnea).  Take all medicine as told by your doctor. ? You may be told to take aspirin or blood thinner medicine. Take this medicine as told by your doctor. ? Understand your medicine instructions.  Make sure any other conditions you have are being taken care of.  Get help right away if:  You suddenly lose feeling (you feel numb) or have weakness in your face, arm, or leg.  Your face or eyelid hangs down to one side.  You suddenly feel confused.  You have trouble talking (aphasia) or understanding what people are saying.  You suddenly have trouble seeing in one or both eyes.  You suddenly have trouble walking.  You are dizzy.  You lose your balance or your movements are clumsy (uncoordinated).  You suddenly have a very bad headache and you do not know the cause.  You have new chest pain.  Your heart feels like it is fluttering or skipping a beat (irregular  heartbeat). Do not wait to see if the symptoms above go away. Get help right away. Call your local emergency services (911 in [place] Do not drive yourself to the hospital. This information is not intended to replace advice given to you by your health care provider. Make sure you discuss any questions you have with your health care provider. Document Released: 03/19/2012 Document Revised: 02/24/2016 Document Reviewed: 03/21/2013 Elsevier Interactive Patient Education  2018 Elsevier Inc.  

## 2017-10-18 NOTE — Progress Notes (Signed)
Vitals:   10/18/17 0833 10/18/17 0836  BP: (!) 158/85 (!) 151/87  Pulse: 80 79  Resp: 16   Temp: 97.6 F (36.4 C)   SpO2: 98%   Weight: 122 lb (55.3 kg)   Height: 5' 5.5" (1.664 m)

## 2017-10-19 ENCOUNTER — Encounter: Payer: Self-pay | Admitting: Family Medicine

## 2017-10-19 ENCOUNTER — Ambulatory Visit (INDEPENDENT_AMBULATORY_CARE_PROVIDER_SITE_OTHER): Admitting: Family Medicine

## 2017-10-19 VITALS — BP 146/90 | HR 82 | Temp 98.6°F | Resp 16 | Ht 65.5 in | Wt 126.0 lb

## 2017-10-19 DIAGNOSIS — Z0000 Encounter for general adult medical examination without abnormal findings: Secondary | ICD-10-CM | POA: Diagnosis not present

## 2017-10-19 DIAGNOSIS — E039 Hypothyroidism, unspecified: Secondary | ICD-10-CM

## 2017-10-19 DIAGNOSIS — F17200 Nicotine dependence, unspecified, uncomplicated: Secondary | ICD-10-CM

## 2017-10-19 DIAGNOSIS — I2510 Atherosclerotic heart disease of native coronary artery without angina pectoris: Secondary | ICD-10-CM

## 2017-10-19 DIAGNOSIS — M436 Torticollis: Secondary | ICD-10-CM | POA: Diagnosis not present

## 2017-10-19 NOTE — Progress Notes (Signed)
Subjective:    Patient ID: [name], female    DOB: 03/05/1956, 62 y.o.   MRN: [id number]  HPI Patient is a very pleasant 61 year old white female who has a known history of carotid artery stenosis, hyperlipidemia, tobacco abuse, coronary artery calcification, and history of cancer who presents today for her complete physical exam. Blood pressure recently at the vascular doctor was elevated. It is also elevated here today but the patient states because she is anxious. However she is not checking her blood pressure at home. She denies any chest pain shortness of breath or dyspnea on exertion. Her carotid artery stenosis is stable. She is interested in trying to quit smoking and plans on using vaping to help wean away from cigarettes. Her mammogram was performed in 2018 and is up-to-date. Her colonoscopy was performed in 2017 and is up-to-date for 5 years. She does not require a Pap smear because of a history of a hysterectomy. She also complains of left-sided neck pain for more than a month. She woke up with it. The pain is located in the lateral aspect of the trapezius. It hurts more when she turns her head to the right. She denies any numbness or tingling radiating down her left arm or her right arm. She denies any falls or injuries. She reports feeling like a stiff muscle in her neck. However the symptoms are not improving.  Past Medical History:  Diagnosis Date  . ADHD (attention deficit hyperactivity disorder) 09/07/2011  . Carotid artery occlusion   . Coronary artery calcification seen on CAT scan   . Coronary artery disease   . 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 DVT (deep venous thrombosis) ([place]) 09/07/2011  . Hypothyroid 09/07/2011  . Smoker 02/17/2013   Past Surgical History:  Procedure Laterality Date  . ABDOMINAL HYSTERECTOMY    . BREAST LUMPECTOMY  March 2013   Left breast  . EYE SURGERY Right Sept. 2014   Lasix - Laser   Current Outpatient Medications on  File Prior to Visit  Medication Sig Dispense Refill  . amphetamine-dextroamphetamine (ADDERALL) 10 MG tablet Take 1 tablet (10 mg total) by mouth 2 (two) times daily. 60 tablet 0  . B Complex-C (B-COMPLEX WITH VITAMIN C) tablet Take 1 tablet by mouth daily.     . cholecalciferol (VITAMIN D) 1000 UNITS tablet Take 1,000 Units by mouth daily.    Marland Kitchen levothyroxine (SYNTHROID, LEVOTHROID) 75 MCG tablet TAKE 1 TABLET DAILY BEFORE BREAKFAST 90 tablet 2  . Multiple Vitamin (MULTIVITAMIN) tablet Take 1 tablet by mouth daily.      . naproxen (NAPROSYN) 375 MG tablet TAKE 1 TABLET TWO TIMES DAILY WITH A MEAL 180 tablet 3  . pantoprazole (PROTONIX) 40 MG tablet Take 1 tablet (40 mg total) by mouth 2 (two) times daily. 180 tablet 3  . rosuvastatin (CRESTOR) 10 MG tablet TAKE 1 TABLET DAILY 90 tablet 1   No current facility-administered medications on file prior to visit.    Allergies  Allergen Reactions  . Penicillins Hives   Social History   Socioeconomic History  . Marital status: Divorced    Spouse name: Not on file  . Number of children: Not on file  . Years of education: Not on file  . Highest education level: Not on file  Social Needs  . Financial resource strain: Not on file  . Food insecurity - worry: Not on file  . Food insecurity - inability: Not on file  . Transportation  needs - medical: Not on file  . Transportation needs - non-medical: Not on file  Occupational History  . Not on file  Tobacco Use  . Smoking status: Current Every Day Smoker    Packs/day: 1.00    Years: 40.00    Pack years: 40.00    Types: E-cigarettes, Cigarettes  . Smokeless tobacco: Never Used  Substance and Sexual Activity  . Alcohol use: No    Alcohol/week: 0.0 oz  . Drug use: No  . Sexual activity: No  Other Topics Concern  . Not on file  Social History Narrative  . Not on file   Family History  Problem Relation Age of Onset  . Cancer Father        Stomach  . Crohn's disease Daughter      .psh   Review of Systems  All other systems reviewed and are negative.      Objective:   Physical Exam  Constitutional: She is oriented to person, place, and time. She appears well-developed and well-nourished. No distress.  HENT:  Head: Normocephalic and atraumatic.  Right Ear: External ear normal.  Left Ear: External ear normal.  Nose: Nose normal.  Mouth/Throat: Oropharynx is clear and moist. No oropharyngeal exudate.  Eyes: Conjunctivae and EOM are normal. Pupils are equal, round, and reactive to light. Right eye exhibits no discharge. Left eye exhibits no discharge. No scleral icterus.  Neck: Normal range of motion. Neck supple. No JVD present. No tracheal deviation present. No thyromegaly present.  Cardiovascular: Normal rate, regular rhythm and intact distal pulses. Exam reveals no gallop and no friction rub.  No murmur heard. Pulmonary/Chest: Effort normal and breath sounds normal. No stridor. No respiratory distress. She has no wheezes. She has no rales. She exhibits no tenderness.  Abdominal: Soft. Bowel sounds are normal. She exhibits no distension and no mass. There is no tenderness. There is no rebound and no guarding.  Musculoskeletal: Normal range of motion. She exhibits no edema, tenderness or deformity.  Lymphadenopathy:    She has no cervical adenopathy.  Neurological: She is alert and oriented to person, place, and time. She has normal reflexes. She displays normal reflexes. No cranial nerve deficit. She exhibits normal muscle tone. Coordination normal.  Skin: Skin is warm. No rash noted. She is not diaphoretic. No erythema. No pallor.  Psychiatric: She has a normal mood and affect. Her behavior is normal. Judgment and thought content normal.  Vitals reviewed.         Assessment & Plan:  Wry neck - Plan: DG Cervical Spine Complete  Annual physical exam  ASCVD (arteriosclerotic cardiovascular disease)  Hypothyroidism, unspecified type  Smoker I  believe the patient has a muscle spasm in her neck. I recommended an x-ray of the cervical spine. If x-ray of the cervical spine is relatively normal, I will recommend physical therapy. Mammogram is up-to-date. Colonoscopy is up-to-date. She does not require a Pap smear. Patient's immunizations are up-to-date: Immunization History  Administered Date(s) Administered  . Influenza,inj,Quad PF,6+ Mos 07/20/2014, 09/03/2015  . Influenza-Unspecified 10/08/2013, 08/16/2016, 08/07/2017  . Pneumococcal Conjugate-13 09/04/2016  . Pneumococcal Polysaccharide-23 09/05/2012  . Zoster Recombinat (Shingrix) 06/19/2017   Her most recent lab work as listed below: Hospital Outpatient Visit on 10/18/2017  Component Date Value Ref Range Status  . Right CCA prox sys 10/18/2017 79  cm/s Final  . Right CCA prox dias 10/18/2017 18  cm/s Final  . Right cca dist sys 10/18/2017 -98  cm/s Final  . Left CCA  prox sys 10/18/2017 120  cm/s Final  . Left CCA prox dias 10/18/2017 36  cm/s Final  . Left CCA dist sys 10/18/2017 -64  cm/s Final  . Left CCA dist dias 10/18/2017 -22  cm/s Final  . Left ICA prox sys 10/18/2017 -77  cm/s Final  . Left ICA prox dias 10/18/2017 -28  cm/s Final  . Left ICA dist sys 10/18/2017 -130  cm/s Final  . Left ICA dist dias 10/18/2017 -56  cm/s Final  . RIGHT CCA MID DIAS 10/18/2017 -20.00  cm/s Final  . RIGHT ECA DIAS 10/18/2017 -21.00  cm/s Final  . RIGHT VERTEBRAL DIAS 10/18/2017 -22.00  cm/s Final  . LEFT ECA DIAS 10/18/2017 -21.00  cm/s Final  . LEFT VERTEBRAL DIAS 10/18/2017 15.00  cm/s Final  Lab on 10/10/2017  Component Date Value Ref Range Status  . WBC 10/10/2017 8.6  3.8 - 10.8 Thousand/uL Final  . RBC 10/10/2017 4.02  3.80 - 5.10 Million/uL Final  . Hemoglobin 10/10/2017 13.0  11.7 - 15.5 g/dL Final  . HCT 10/10/2017 38.1  35.0 - 45.0 % Final  . MCV 10/10/2017 94.8  80.0 - 100.0 fL Final  . MCH 10/10/2017 32.3  27.0 - 33.0 pg Final  . MCHC 10/10/2017 34.1  32.0 - 36.0  g/dL Final  . RDW 10/10/2017 12.1  11.0 - 15.0 % Final  . Platelets 10/10/2017 518* 140 - 400 Thousand/uL Final  . MPV 10/10/2017 10.3  7.5 - 12.5 fL Final  . Neutro Abs 10/10/2017 6,063  1,500 - 7,800 cells/uL Final  . Lymphs Abs 10/10/2017 1,643  850 - 3,900 cells/uL Final  . WBC mixed population 10/10/2017 542  200 - 950 cells/uL Final  . Eosinophils Absolute 10/10/2017 275  15 - 500 cells/uL Final  . Basophils Absolute 10/10/2017 77  0 - 200 cells/uL Final  . Neutrophils Relative % 10/10/2017 70.5  % Final  . Total Lymphocyte 10/10/2017 19.1  % Final  . Monocytes Relative 10/10/2017 6.3  % Final  . Eosinophils Relative 10/10/2017 3.2  % Final  . Basophils Relative 10/10/2017 0.9  % Final  . Glucose, Bld 10/10/2017 95  65 - 99 mg/dL Final   Comment: .            Fasting reference interval .   . BUN 10/10/2017 13  7 - 25 mg/dL Final  . Creat 10/10/2017 0.59  0.50 - 0.99 mg/dL Final   Comment: For patients >18 years of age, the reference limit for Creatinine is approximately 13% higher for people identified as African-American. .   Havery Moros Ratio 95/62/1308 NOT APPLICABLE  6 - 22 (calc) Final  . Sodium 10/10/2017 135  135 - 146 mmol/L Final  . Potassium 10/10/2017 4.7  3.5 - 5.3 mmol/L Final  . Chloride 10/10/2017 100  98 - 110 mmol/L Final  . CO2 10/10/2017 25  20 - 32 mmol/L Final  . Calcium 10/10/2017 10.1  8.6 - 10.4 mg/dL Final  . Total Protein 10/10/2017 6.7  6.1 - 8.1 g/dL Final  . Albumin 10/10/2017 4.3  3.6 - 5.1 g/dL Final  . Globulin 10/10/2017 2.4  1.9 - 3.7 g/dL (calc) Final  . AG Ratio 10/10/2017 1.8  1.0 - 2.5 (calc) Final  . Total Bilirubin 10/10/2017 0.4  0.2 - 1.2 mg/dL Final  . Alkaline phosphatase (APISO) 10/10/2017 88  33 - 130 U/L Final  . AST 10/10/2017 22  10 - 35 U/L Final  . ALT 10/10/2017 19  6 -  29 U/L Final  . TSH 10/10/2017 1.51  0.40 - 4.50 mIU/L Final  . Cholesterol 10/10/2017 157  <200 mg/dL Final  . HDL 10/10/2017 88  >50 mg/dL  Final  . Triglycerides 10/10/2017 86  <150 mg/dL Final  . LDL Cholesterol (Calc) 10/10/2017 52  <100 mg/dL Final   Comment: . Desirable range <100 mg/dL for primary prevention; <70 mg/dL for patients with CHD or diabetic patients with > or = 2 CHD risk factors. Marland Kitchen LDL-C is now calculated using the Martin-Hopkins calculation, which is a validated novel method providing better accuracy than the Friedewald equation in the estimation of LDL-C. Cresenciano Genre et al. Annamaria Helling. 7893;810(17): 2061-2068 . For additional information, please refer to http://education.[facility].com/faq/FAQ164 (This link is being provided for informational/educational purposes only.)   . Total CHOL/HDL Ratio 10/10/2017 1.8  <5.0 calc Final  . Non-HDL Cholesterol (Calc) 10/10/2017 69  <130 mg/dL (calc) Final   Comment: . For patients with diabetes plus 1 major ASCVD risk factor, treating to a non-HDL-C goal of <100 mg/dL (LDL-C of <70 mg/dL) is considered a therapeutic option.   . LDL Particle Number 10/10/2017 864  732 - 2,035 nmol/L Final   Comment: . Risk: Optimal <1138; Moderate [id number]; High >1409   . LDL Small 10/10/2017 147  75 - 452 nmol/L Final   Comment: . Risk: Optimal <142; Moderate 142-219; High >219   . LDL Medium 10/10/2017 160  122 - 498 nmol/L Final   Comment: . Risk: Optimal <215; Moderate 215-301; High >301   . LDL Large 10/10/2017 7,175  3,966 - 11,938 nmol/L Final   Comment: . Risk: Optimal >6729; Moderate [id number]; High <5353   . LDL Pattern 10/10/2017 B* A Pattern Final   Comment: . Risk: Optimal Pattern A; High Pattern B   . LDL Peak Size 10/10/2017 216.8* > OR = 217 Angstrom Final   Comment: . Risk: Optimal >222.9; Moderate 222.9-217.4; High <217.4 . Adult cardiovascular event risk category cut points (optimal, moderate, high) are based on adult [place] reference population. Association between lipoprotein subfractions and cardiovascular events is based on Musunuru et al.  Christie Nottingham. 5102;58:5277. Marland Kitchen For additional information, please refer to http://education.[facility].com/faq/FAQ134 (This link is being provided for informational/educational purposes only.) . This test was developed and its analytical performance characteristics have been determined by [facility]. It has not been cleared or approved by FDA. This assay has been validated pursuant to the CLIA regulations and is used for clinical purposes. .   . Apolipoprotein B 10/10/2017 53  49 - 103 mg/dL Final   Comment: . Risk: Optimal < 80 mg/dL; Moderate 80-119 mg/dL; High > or = 120 mg/dL Cardiovascular event risk category cut points (optimal, moderate, high) are based on National Lipid Association recommendations - Lynnell Dike al. J Clin Lipidol. 2011;5:338   . Lipoprotein (a) 10/10/2017 21  <75 nmol/L Final   Comment: . Risk: Optimal < 75 nmol/L; Moderate 75-125 nmol/L; High > 125 nmol/L Cardiovascular event risk category cut points (optimal, moderate, high) are based on Marcovina et al. Clin Chem. 8242;35:3614 and Nordestgaard et al. European Heart J. [phone] (results of meta-analysis and expert panel recommendations).   . Cholesterol 10/10/2017 146  <200 mg/dL Final  . HDL 10/10/2017 86  >50 mg/dL Final  . Triglycerides 10/10/2017 85  <150 mg/dL Final  . LDL Cholesterol (Calc) 10/10/2017 43  mg/dL (calc) Final   Comment: Reference range: <100 . Desirable range <100 mg/dL for primary prevention;   <70 mg/dL  for patients with CHD or diabetic patients  with > or = 2 CHD risk factors. Marland Kitchen LDL-C is now calculated using the Martin-Hopkins  calculation, which is a validated novel method providing  better accuracy than the Friedewald equation in the  estimation of LDL-C.  Cresenciano Genre et al. Annamaria Helling. 1388;719(59): 2061-2068  (http://education.[facility].com/faq/FAQ164)   . Total CHOL/HDL Ratio 10/10/2017 1.7  <5.0 (calc) Final  .  Non-HDL Cholesterol (Calc) 10/10/2017 60  <130 mg/dL (calc) Final   Comment: For patients with diabetes plus 1 major ASCVD risk  factor, treating to a non-HDL-C goal of <100 mg/dL  (LDL-C of <70 mg/dL) is considered a therapeutic  option.    Goal LDL cholesterol is less than 70. Hers is 65. HDL is 86. Particle numbers are 860. Goal particle numbers are less than 700 however given her elevated HDL I believe this is acceptable. Therefore I strenuously encouraged the patient quit smoking as this is her biggest risk factor. She will check her blood pressure everyday and email me in one week with the values. If greater than 140/90, we will need to increase medication to manage her blood pressure. Cancer screening is up-to-date. Immunizations are up-to-date. TSH is within therapeutic range

## 2017-10-30 DIAGNOSIS — Z4789 Encounter for other orthopedic aftercare: Secondary | ICD-10-CM | POA: Insufficient documentation

## 2017-11-19 ENCOUNTER — Encounter: Payer: Self-pay | Admitting: Family Medicine

## 2017-11-19 DIAGNOSIS — F909 Attention-deficit hyperactivity disorder, unspecified type: Secondary | ICD-10-CM

## 2017-11-20 MED ORDER — AMPHETAMINE-DEXTROAMPHETAMINE 10 MG PO TABS: 10.0000 mg | ORAL_TABLET | Freq: Two times a day (BID) | ORAL | 0 refills | Status: DC

## 2017-12-17 ENCOUNTER — Encounter: Payer: Self-pay | Admitting: Family Medicine

## 2017-12-17 DIAGNOSIS — F909 Attention-deficit hyperactivity disorder, unspecified type: Secondary | ICD-10-CM

## 2017-12-17 MED ORDER — AMPHETAMINE-DEXTROAMPHETAMINE 10 MG PO TABS: 10.0000 mg | ORAL_TABLET | Freq: Two times a day (BID) | ORAL | 0 refills | Status: DC

## 2017-12-17 NOTE — Telephone Encounter (Signed)
Pt requesting refill on Adderall      LOV: 10/19/17  LRF:   11/20/17

## 2018-01-14 ENCOUNTER — Encounter: Payer: Self-pay | Admitting: Family Medicine

## 2018-01-14 NOTE — Telephone Encounter (Signed)
Pt requesting refill on Adderall      LOV: 10/19/17  LRF:   12/17/17

## 2018-01-15 ENCOUNTER — Other Ambulatory Visit: Payer: Self-pay | Admitting: Family Medicine

## 2018-01-15 DIAGNOSIS — F909 Attention-deficit hyperactivity disorder, unspecified type: Secondary | ICD-10-CM

## 2018-01-15 MED ORDER — AMPHETAMINE-DEXTROAMPHETAMINE 10 MG PO TABS: 10.0000 mg | ORAL_TABLET | Freq: Two times a day (BID) | ORAL | 0 refills | Status: DC

## 2018-02-06 ENCOUNTER — Encounter: Payer: Self-pay | Admitting: Family Medicine

## 2018-02-06 DIAGNOSIS — F909 Attention-deficit hyperactivity disorder, unspecified type: Secondary | ICD-10-CM

## 2018-02-06 NOTE — Telephone Encounter (Signed)
Ok to refill??  Last office visit 10/19/2017.  Last refill 01/15/2018.

## 2018-02-07 MED ORDER — AMPHETAMINE-DEXTROAMPHETAMINE 10 MG PO TABS: 10.0000 mg | ORAL_TABLET | Freq: Two times a day (BID) | ORAL | 0 refills | Status: DC

## 2018-03-01 ENCOUNTER — Other Ambulatory Visit: Payer: Self-pay | Admitting: Family Medicine

## 2018-03-21 ENCOUNTER — Encounter: Payer: Self-pay | Admitting: Family Medicine

## 2018-03-21 DIAGNOSIS — F909 Attention-deficit hyperactivity disorder, unspecified type: Secondary | ICD-10-CM

## 2018-03-21 MED ORDER — AMPHETAMINE-DEXTROAMPHETAMINE 10 MG PO TABS: 10.0000 mg | ORAL_TABLET | Freq: Two times a day (BID) | ORAL | 0 refills | Status: DC

## 2018-03-21 NOTE — Telephone Encounter (Signed)
Pt requesting refill on Adderall      LOV: 10/12/17  LRF:   02/07/18

## 2018-04-11 ENCOUNTER — Ambulatory Visit
Admission: RE | Admit: 2018-04-11 | Discharge: 2018-04-11 | Disposition: A | Discharge status: Home / Self Care | Source: Ambulatory Visit | Attending: Adult Health | Admitting: Adult Health

## 2018-04-11 DIAGNOSIS — Z1239 Encounter for other screening for malignant neoplasm of breast: Secondary | ICD-10-CM

## 2018-04-24 ENCOUNTER — Encounter: Payer: Self-pay | Admitting: Family Medicine

## 2018-04-24 DIAGNOSIS — F909 Attention-deficit hyperactivity disorder, unspecified type: Secondary | ICD-10-CM

## 2018-04-24 NOTE — Telephone Encounter (Signed)
Pt requesting refill on Adderall      LOV: 10/19/17  LRF:   03/21/18

## 2018-04-25 MED ORDER — AMPHETAMINE-DEXTROAMPHETAMINE 10 MG PO TABS: 10.0000 mg | ORAL_TABLET | Freq: Two times a day (BID) | ORAL | 0 refills | Status: DC

## 2018-05-21 ENCOUNTER — Encounter: Payer: Self-pay | Admitting: Family Medicine

## 2018-05-21 DIAGNOSIS — F909 Attention-deficit hyperactivity disorder, unspecified type: Secondary | ICD-10-CM

## 2018-05-21 NOTE — Telephone Encounter (Signed)
Ok to refill??  Last office visit 10/19/2017.  Last refill 04/25/2018.

## 2018-05-22 MED ORDER — AMPHETAMINE-DEXTROAMPHETAMINE 10 MG PO TABS: 10.0000 mg | ORAL_TABLET | Freq: Two times a day (BID) | ORAL | 0 refills | Status: DC

## 2018-06-23 ENCOUNTER — Encounter: Payer: Self-pay | Admitting: Family Medicine

## 2018-06-23 DIAGNOSIS — F909 Attention-deficit hyperactivity disorder, unspecified type: Secondary | ICD-10-CM

## 2018-06-24 MED ORDER — AMPHETAMINE-DEXTROAMPHETAMINE 10 MG PO TABS: 10.0000 mg | ORAL_TABLET | Freq: Two times a day (BID) | ORAL | 0 refills | Status: DC

## 2018-06-24 NOTE — Telephone Encounter (Signed)
Pt requesting refill on Adderall      LOV: 10/19/17  LRF:   05/22/18

## 2018-07-25 ENCOUNTER — Encounter: Payer: Self-pay | Admitting: Family Medicine

## 2018-07-25 DIAGNOSIS — F909 Attention-deficit hyperactivity disorder, unspecified type: Secondary | ICD-10-CM

## 2018-07-25 MED ORDER — AMPHETAMINE-DEXTROAMPHETAMINE 10 MG PO TABS: 10.0000 mg | ORAL_TABLET | Freq: Two times a day (BID) | ORAL | 0 refills | Status: DC

## 2018-07-25 NOTE — Telephone Encounter (Signed)
Ok to refill??  Last office visit 10/19/2017.  Last refill 06/24/2018.

## 2018-08-14 ENCOUNTER — Other Ambulatory Visit: Payer: Self-pay | Admitting: Family Medicine

## 2018-08-22 ENCOUNTER — Other Ambulatory Visit: Payer: Self-pay | Admitting: Family Medicine

## 2018-09-01 ENCOUNTER — Encounter: Payer: Self-pay | Admitting: Family Medicine

## 2018-09-01 DIAGNOSIS — F909 Attention-deficit hyperactivity disorder, unspecified type: Secondary | ICD-10-CM

## 2018-09-02 MED ORDER — AMPHETAMINE-DEXTROAMPHETAMINE 10 MG PO TABS: 10.0000 mg | ORAL_TABLET | Freq: Two times a day (BID) | ORAL | 0 refills | Status: DC

## 2018-09-02 NOTE — Telephone Encounter (Signed)
Pt requesting refill on Adderall      LOV: 10/19/17  LRF:  07/25/18

## 2018-09-27 ENCOUNTER — Encounter: Payer: Self-pay | Admitting: Family Medicine

## 2018-09-27 DIAGNOSIS — F909 Attention-deficit hyperactivity disorder, unspecified type: Secondary | ICD-10-CM

## 2018-09-27 MED ORDER — AMPHETAMINE-DEXTROAMPHETAMINE 10 MG PO TABS: 10.0000 mg | ORAL_TABLET | Freq: Two times a day (BID) | ORAL | 0 refills | Status: DC

## 2018-09-27 NOTE — Telephone Encounter (Signed)
Ok to refill??  Last office visit 10/19/2017.  Last refill 09/02/2018.

## 2018-10-09 ENCOUNTER — Encounter: Payer: Self-pay | Admitting: Family Medicine

## 2018-10-10 ENCOUNTER — Other Ambulatory Visit

## 2018-10-10 DIAGNOSIS — E785 Hyperlipidemia, unspecified: Secondary | ICD-10-CM

## 2018-10-10 DIAGNOSIS — I2510 Atherosclerotic heart disease of native coronary artery without angina pectoris: Secondary | ICD-10-CM

## 2018-10-10 DIAGNOSIS — E039 Hypothyroidism, unspecified: Secondary | ICD-10-CM

## 2018-10-13 ENCOUNTER — Other Ambulatory Visit: Payer: Self-pay | Admitting: Family Medicine

## 2018-10-16 ENCOUNTER — Encounter: Payer: Self-pay | Admitting: Family Medicine

## 2018-10-16 LAB — CBC WITH DIFFERENTIAL/PLATELET
Absolute Monocytes: 610 cells/uL (ref 200–950)
Basophils Absolute: 100 cells/uL (ref 0–200)
Basophils Relative: 1.1 %
Eosinophils Absolute: 373 cells/uL (ref 15–500)
Eosinophils Relative: 4.1 %
HCT: 37.1 % (ref 35.0–45.0)
Hemoglobin: 12.5 g/dL (ref 11.7–15.5)
Lymphs Abs: 1693 cells/uL (ref 850–3900)
MCH: 32 pg (ref 27.0–33.0)
MCHC: 33.7 g/dL (ref 32.0–36.0)
MCV: 94.9 fL (ref 80.0–100.0)
MPV: 10.5 fL (ref 7.5–12.5)
Monocytes Relative: 6.7 %
Neutro Abs: 6325 cells/uL (ref 1500–7800)
Neutrophils Relative %: 69.5 %
Platelets: 558 10*3/uL — ABNORMAL HIGH (ref 140–400)
RBC: 3.91 10*6/uL (ref 3.80–5.10)
RDW: 12.4 % (ref 11.0–15.0)
Total Lymphocyte: 18.6 %
WBC: 9.1 10*3/uL (ref 3.8–10.8)

## 2018-10-16 LAB — COMPLETE METABOLIC PANEL WITH GFR
AG Ratio: 1.7 (calc) (ref 1.0–2.5)
ALT: 23 U/L (ref 6–29)
AST: 25 U/L (ref 10–35)
Albumin: 4.2 g/dL (ref 3.6–5.1)
Alkaline phosphatase (APISO): 85 U/L (ref 33–130)
BUN: 14 mg/dL (ref 7–25)
CO2: 25 mmol/L (ref 20–32)
Calcium: 9.6 mg/dL (ref 8.6–10.4)
Chloride: 101 mmol/L (ref 98–110)
Creat: 0.62 mg/dL (ref 0.50–0.99)
GFR, Est African American: 112 mL/min/{1.73_m2} (ref >=60)
GFR, Est Non African American: 97 mL/min/{1.73_m2} (ref >=60)
Globulin: 2.5 g/dL (calc) (ref 1.9–3.7)
Glucose, Bld: 77 mg/dL (ref 65–99)
Potassium: 4.5 mmol/L (ref 3.5–5.3)
Sodium: 137 mmol/L (ref 135–146)
Total Bilirubin: 0.4 mg/dL (ref 0.2–1.2)
Total Protein: 6.7 g/dL (ref 6.1–8.1)

## 2018-10-16 LAB — CARDIO IQ(R) ADVANCED LIPID PANEL
Apolipoprotein B: 52 mg/dL
Cholesterol: 157 mg/dL (ref <200)
HDL: 89 mg/dL (ref >50)
LDL Cholesterol (Calc): 51 mg/dL (calc) (ref <100)
LDL Large: 7963 nmol/L (ref 3966–11938)
LDL Medium: 140 nmol/L (ref 122–498)
LDL Particle Number: 866 nmol/L (ref 732–2035)
LDL Peak Size: 220.5 Angstrom (ref >=217)
LDL Small: 136 nmol/L (ref 75–452)
Lipoprotein (a): 20 nmol/L (ref <75)
Non-HDL Cholesterol (Calc): 68 mg/dL (calc) (ref <130)
Total CHOL/HDL Ratio: 1.8 calc (ref <5.0)
Triglycerides: 88 mg/dL (ref <150)

## 2018-10-16 LAB — TSH: TSH: 0.96 mIU/L (ref 0.40–4.50)

## 2018-10-18 ENCOUNTER — Encounter: Payer: Self-pay | Admitting: Adult Health

## 2018-10-18 ENCOUNTER — Telehealth: Payer: Self-pay | Admitting: Adult Health

## 2018-10-18 ENCOUNTER — Inpatient Hospital Stay: Attending: Adult Health | Admitting: Adult Health

## 2018-10-18 VITALS — BP 137/80 | HR 84 | Temp 98.3°F | Resp 18 | Ht 65.5 in | Wt 128.8 lb

## 2018-10-18 DIAGNOSIS — Z1239 Encounter for other screening for malignant neoplasm of breast: Secondary | ICD-10-CM

## 2018-10-18 DIAGNOSIS — E2839 Other primary ovarian failure: Secondary | ICD-10-CM

## 2018-10-18 DIAGNOSIS — F17200 Nicotine dependence, unspecified, uncomplicated: Secondary | ICD-10-CM

## 2018-10-18 DIAGNOSIS — C50412 Malignant neoplasm of upper-outer quadrant of left female breast: Secondary | ICD-10-CM

## 2018-10-18 DIAGNOSIS — Z86000 Personal history of in-situ neoplasm of breast: Secondary | ICD-10-CM | POA: Diagnosis not present

## 2018-10-18 DIAGNOSIS — Z720 Tobacco use: Secondary | ICD-10-CM | POA: Insufficient documentation

## 2018-10-18 DIAGNOSIS — Z170 Estrogen receptor positive status [ER+]: Secondary | ICD-10-CM

## 2018-10-18 NOTE — Patient Instructions (Signed)

## 2018-10-18 NOTE — Progress Notes (Signed)
CLINIC:  Survivorship   REASON FOR VISIT:  Routine follow-up for history of breast cancer.   BRIEF ONCOLOGIC HISTORY:    Breast cancer of upper-outer quadrant of left female breast ([name])   01/30/2007 Surgery    Left breast lumpectomy: High-grade DCIS with focal necrosis 1.7 cm ER 54%, PR 74%    03/26/2007 - 05/13/2007 Radiation Therapy    Adjuvant radiation therapy    06/05/2007 - 06/05/2012 Anti-estrogen oral therapy    Arimidex 1 mg daily      INTERVAL HISTORY:  Ms. [name] presents to the [name] today for routine follow-up for her history of breast cancer.  She is still seeing her PCP Dr. Dennard Schaumann regularly.  She says her daughter and 63 year old grandson lives with her so she gets exercise by helping with him.  She is up to date with colon cancer screening.  She sees her PCP on Monday for her physical, and has not undergone skin cancer screening, but plans to do so.  She underwent a bilateral mammogram in 04/2018 that was negative and showed no evidence of malignancy, breast density was category B.  She continues to smoke.  She has a 40 pack year tobacco history.  Her last lung cancer screening was in 09/2016.      REVIEW OF SYSTEMS:  Review of Systems  Constitutional: Negative for appetite change, chills, fatigue, fever and unexpected weight change.  HENT:   Negative for hearing loss, lump/mass and sore throat.   Eyes: Negative for eye problems and icterus.  Respiratory: Negative for chest tightness, cough and shortness of breath.   Cardiovascular: Negative for chest pain, leg swelling and palpitations.  Gastrointestinal: Negative for abdominal distention, abdominal pain, blood in stool, constipation, diarrhea, nausea and vomiting.  Endocrine: Negative for hot flashes.  Genitourinary: Negative for difficulty urinating.   Musculoskeletal: Negative for arthralgias.  Skin: Negative for itching and rash.  Neurological: Negative for dizziness, extremity weakness, headaches  and numbness.  Hematological: Negative for adenopathy. Does not bruise/bleed easily.  Psychiatric/Behavioral: Negative for depression. The patient is not nervous/anxious.   Breast: Denies any new nodularity, masses, tenderness, nipple changes, or nipple discharge.       PAST MEDICAL/SURGICAL HISTORY:  Past Medical History:  Diagnosis Date  . ADHD (attention deficit hyperactivity disorder) 09/07/2011  . Carotid artery occlusion   . Coronary artery calcification seen on CAT scan   . Coronary artery disease   . 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 DVT (deep venous thrombosis) ([place]) 09/07/2011  . Hypothyroid 09/07/2011  . Smoker 02/17/2013   Past Surgical History:  Procedure Laterality Date  . ABDOMINAL HYSTERECTOMY    . BREAST LUMPECTOMY  March 2013   Left breast  . EYE SURGERY Right Sept. 2014   Lasix - Laser     ALLERGIES:  Allergies  Allergen Reactions  . Penicillins Hives     CURRENT MEDICATIONS:  Outpatient Encounter Medications as of 10/18/2018  Medication Sig  . amphetamine-dextroamphetamine (ADDERALL) 10 MG tablet Take 1 tablet (10 mg total) by mouth 2 (two) times daily.  . B Complex-C (B-COMPLEX WITH VITAMIN C) tablet Take 1 tablet by mouth daily.   . cholecalciferol (VITAMIN D) 1000 UNITS tablet Take 1,000 Units by mouth daily.  Marland Kitchen levothyroxine (SYNTHROID, LEVOTHROID) 75 MCG tablet TAKE 1 TABLET DAILY BEFORE BREAKFAST  . Multiple Vitamin (MULTIVITAMIN) tablet Take 1 tablet by mouth daily.    . naproxen (NAPROSYN) 375 MG tablet TAKE 1 TABLET TWO  TIMES DAILY WITH A MEAL  . pantoprazole (PROTONIX) 40 MG tablet TAKE 1 TABLET TWICE A DAY  . rosuvastatin (CRESTOR) 10 MG tablet TAKE 1 TABLET DAILY   No facility-administered encounter medications on file as of 10/18/2018.      ONCOLOGIC FAMILY HISTORY:  Family History  Problem Relation Age of Onset  . Cancer Father        Stomach  . Crohn's disease Daughter     GENETIC COUNSELING/TESTING: Not  at this time  SOCIAL HISTORY:  [name] is divorced and lives with her daughter and grandson in [place], [place].  She has another daughter who lives on her own in [place].  Ms. 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  She denies any current or history of or illicit drug use.  She is a current every day smoker of 1ppd for 30+ years.  Not ready to quit today.  Occasional drink of beer.  (updated and reviewed on 10/18/2018)   PHYSICAL EXAMINATION:  Vital Signs: Vitals:   10/18/18 1518  BP: 137/80  Pulse: 84  Resp: 18  Temp: 98.3 F (36.8 C)  SpO2: 100%   Filed Weights   10/18/18 1518  Weight: 128 lb 12.8 oz (58.4 kg)   General: Well-nourished, well-appearing female in no acute distress.  Unaccompanied today.   HEENT: Head is normocephalic.  Pupils equal and reactive to light. Conjunctivae clear without exudate.  Sclerae anicteric. Oral mucosa is pink, moist.  Oropharynx is pink without lesions or erythema.  Lymph: No cervical, supraclavicular, or infraclavicular lymphadenopathy noted on palpation.  Cardiovascular: Regular rate and rhythm.Marland Kitchen Respiratory: Clear to auscultation bilaterally. Chest expansion symmetric; breathing non-labored.  Breast Exam:  -Left breast: No appreciable masses on palpation. No skin redness, thickening, or peau d'orange appearance; no nipple retraction or nipple discharge; mild distortion in symmetry at previous lumpectomy site well healed scar without erythema or nodularity.  -Right breast: No appreciable masses on palpation. No skin redness, thickening, or peau d'orange appearance; no nipple retraction or nipple discharge; -Axilla: No axillary adenopathy bilaterally.  GI: Abdomen soft and round; non-tender, non-distended. Bowel sounds normoactive. No hepatosplenomegaly.   GU: Deferred.  Neuro: No focal deficits. Steady gait.  Psych: Mood and affect normal and appropriate for  situation.  MSK: No focal spinal tenderness to palpation, full range of motion in bilateral upper extremities Extremities: No edema. Skin: Warm and dry.  LABORATORY DATA:  None for this visit   DIAGNOSTIC IMAGING:  Most recent mammogram:      ASSESSMENT AND PLAN:  Ms.. Dignan is a pleasant 63 y.o. 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  She [facility] for surveillance and routine follow-up.   1. History of breast cancer:  Ms. Payer is currently clinically and radiographically without evidence of disease or recurrence of breast cancer.  She will be due for repeat mammgoram in 04/2019.  She will return to see [place] next year for LTS follow up.  Declined graduation today.   I encouraged her to call me with any questions or concerns before her next visit at the cancer center, and I would be happy to see her sooner, if needed.    2. Bone health:  Given Ms. Kouba's age, history of breast cancer, and her previous anti-estrogen therapy  with Anastrozole, she is at risk for bone demineralization. Her last DEXA scan was in 2015 and was normal. I ordered repeat bone density in 04/2019 when she has her mammogram.  She was given education on specific food and activities to promote bone health.  3. Cancer screening:  Due to Ms. Chaddock's history and her age, she should receive screening for skin cancers, colon cancer, lung cancer and gynecologic cancers. Her lung cancer screening is overdue, I ordered repeat imaging today.  She was encouraged to follow-up with her PCP for appropriate cancer screenings.   4. Health maintenance and wellness promotion: Ms. Frede was encouraged to consume 5-7 servings of fruits and vegetables per day. She was also encouraged to engage in moderate to vigorous exercise for 30 minutes per day  most days of the week. She was instructed to limit her alcohol consumption and stop smoking.    Dispo:  -Return to cancer center in one year for LTS follow up -Mammogram in 04/2019 -Bone density in 04/2019 -Lung Cancer screening   A total of (30) minutes of face-to-face time was spent with this patient with greater than 50% of that time in counseling and care-coordination.   Gardenia Phlegm, NP Survivorship Program [facility] [phone]   Note: PRIMARY CARE PROVIDER [name], [place] [phone]

## 2018-10-18 NOTE — Telephone Encounter (Signed)
Gave avs and calendar ° °

## 2018-10-21 ENCOUNTER — Encounter: Payer: Self-pay | Admitting: Family Medicine

## 2018-10-21 ENCOUNTER — Ambulatory Visit (INDEPENDENT_AMBULATORY_CARE_PROVIDER_SITE_OTHER): Admitting: Family Medicine

## 2018-10-21 VITALS — BP 156/88 | HR 80 | Temp 97.7°F | Resp 16 | Ht 65.5 in | Wt 126.0 lb

## 2018-10-21 DIAGNOSIS — F17200 Nicotine dependence, unspecified, uncomplicated: Secondary | ICD-10-CM

## 2018-10-21 DIAGNOSIS — Z23 Encounter for immunization: Secondary | ICD-10-CM

## 2018-10-21 DIAGNOSIS — Z0000 Encounter for general adult medical examination without abnormal findings: Secondary | ICD-10-CM | POA: Diagnosis not present

## 2018-10-21 DIAGNOSIS — Z122 Encounter for screening for malignant neoplasm of respiratory organs: Secondary | ICD-10-CM | POA: Diagnosis not present

## 2018-10-21 DIAGNOSIS — Z780 Asymptomatic menopausal state: Secondary | ICD-10-CM | POA: Diagnosis not present

## 2018-10-21 DIAGNOSIS — I2510 Atherosclerotic heart disease of native coronary artery without angina pectoris: Secondary | ICD-10-CM

## 2018-10-21 DIAGNOSIS — E039 Hypothyroidism, unspecified: Secondary | ICD-10-CM

## 2018-10-21 NOTE — Addendum Note (Signed)
Addended by: Shary Decamp B on: 10/21/2018 04:29 PM   Modules accepted: Orders

## 2018-10-21 NOTE — Progress Notes (Signed)
Subjective:    Patient ID: [name], female    DOB: Jan 18, 1956, 63 y.o.   MRN: [id number]  HPI Patient is here today for complete physical exam.  Patient's last colonoscopy was in 2017.  It is due to be repeated in 5 years so he is due in 2022.  She has her mammogram scheduled for July.  She does not require Pap smear because she is had a hysterectomy.  Her last bone density test was performed 5 years ago and is due to be repeated.  At that time it was normal.  She has a 40+-pack-year history of smoking.  She continues to smoke.  Therefore she is due for lung cancer screening with a low-dose CT scan.  She has a known history of ASCVD with less than 60% stenosis bilaterally in the internal carotid arteries by velocity. She continues to smoke.  Her blood pressure is elevated today however she is checking it every day at home and finding it to be between 120 and 130/80. Most recent lab work as listed below Lab on 10/10/2018  Component Date Value Ref Range Status  . WBC 10/10/2018 9.1  3.8 - 10.8 Thousand/uL Final  . RBC 10/10/2018 3.91  3.80 - 5.10 Million/uL Final  . Hemoglobin 10/10/2018 12.5  11.7 - 15.5 g/dL Final  . HCT 10/10/2018 37.1  35.0 - 45.0 % Final  . MCV 10/10/2018 94.9  80.0 - 100.0 fL Final  . MCH 10/10/2018 32.0  27.0 - 33.0 pg Final  . MCHC 10/10/2018 33.7  32.0 - 36.0 g/dL Final  . RDW 10/10/2018 12.4  11.0 - 15.0 % Final  . Platelets 10/10/2018 558* 140 - 400 Thousand/uL Final  . MPV 10/10/2018 10.5  7.5 - 12.5 fL Final  . Neutro Abs 10/10/2018 6,325  1,500 - 7,800 cells/uL Final  . Lymphs Abs 10/10/2018 1,693  850 - 3,900 cells/uL Final  . Absolute Monocytes 10/10/2018 610  200 - 950 cells/uL Final  . Eosinophils Absolute 10/10/2018 373  15 - 500 cells/uL Final  . Basophils Absolute 10/10/2018 100  0 - 200 cells/uL Final  . Neutrophils Relative % 10/10/2018 69.5  % Final  . Total Lymphocyte 10/10/2018 18.6  % Final  . Monocytes Relative 10/10/2018 6.7  % Final  .  Eosinophils Relative 10/10/2018 4.1  % Final  . Basophils Relative 10/10/2018 1.1  % Final  . Glucose, Bld 10/10/2018 77  65 - 99 mg/dL Final   Comment: .            Fasting reference interval .   . BUN 10/10/2018 14  7 - 25 mg/dL Final  . Creat 10/10/2018 0.62  0.50 - 0.99 mg/dL Final   Comment: For patients >67 years of age, the reference limit for Creatinine is approximately 13% higher for people identified as African-American. .   . GFR, Est Non African American 10/10/2018 97  > OR = 60 mL/min/1.46m2 Final  . GFR, Est African American 10/10/2018 112  > OR = 60 mL/min/1.13m2 Final  . BUN/Creatinine Ratio 01/75/1025 NOT APPLICABLE  6 - 22 (calc) Final  . Sodium 10/10/2018 137  135 - 146 mmol/L Final  . Potassium 10/10/2018 4.5  3.5 - 5.3 mmol/L Final  . Chloride 10/10/2018 101  98 - 110 mmol/L Final  . CO2 10/10/2018 25  20 - 32 mmol/L Final  . Calcium 10/10/2018 9.6  8.6 - 10.4 mg/dL Final  . Total Protein 10/10/2018 6.7  6.1 - 8.1 g/dL Final  .  Albumin 10/10/2018 4.2  3.6 - 5.1 g/dL Final  . Globulin 10/10/2018 2.5  1.9 - 3.7 g/dL (calc) Final  . AG Ratio 10/10/2018 1.7  1.0 - 2.5 (calc) Final  . Total Bilirubin 10/10/2018 0.4  0.2 - 1.2 mg/dL Final  . Alkaline phosphatase (APISO) 10/10/2018 85  33 - 130 U/L Final  . AST 10/10/2018 25  10 - 35 U/L Final  . ALT 10/10/2018 23  6 - 29 U/L Final  . TSH 10/10/2018 0.96  0.40 - 4.50 mIU/L Final  . Cholesterol 10/10/2018 157  <200 mg/dL Final  . HDL 10/10/2018 89  >50 mg/dL Final  . Triglycerides 10/10/2018 88  <150 mg/dL Final  . LDL Cholesterol (Calc) 10/10/2018 51  <100 mg/dL (calc) Final   Comment: . Desirable range <100 mg/dL for primary prevention; <70 mg/dL for patients with CHD or diabetic patients with > or = 2 CHD risk factors. Marland Kitchen LDL-C is now calculated using the Martin-Hopkins calculation, which is a validated novel method providing better accuracy than the Friedewald equation in the estimation of LDL-C. Cresenciano Genre  et al. Annamaria Helling. 7106;269(48): 2061-2068 . For additional information, please refer to http://education.[facility].com/faq/FAQ164 (This link is being provided for informational/educational purposes only.)   . Total CHOL/HDL Ratio 10/10/2018 1.8  <5.0 calc Final  . Non-HDL Cholesterol (Calc) 10/10/2018 68  <130 mg/dL (calc) Final   Comment: . For patients with diabetes plus 1 major ASCVD risk factor, treating to a non-HDL-C goal of <100 mg/dL (LDL-C of <70 mg/dL) is considered a therapeutic option.   . LDL Particle Number 10/10/2018 866  732 - 2,035 nmol/L Final   Comment: . Risk: Optimal <1138; Moderate [id number]; High >1409   . LDL Small 10/10/2018 136  75 - 452 nmol/L Final   Comment: . Risk: Optimal <142; Moderate 142-219; High >219   . LDL Medium 10/10/2018 140  122 - 498 nmol/L Final   Comment: . Risk: Optimal <215; Moderate 215-301; High >301   . LDL Large 10/10/2018 7,963  3,966 - 11,938 nmol/L Final   Comment: . Risk: Optimal >6729; Moderate [id number]; High <5353   . LDL Pattern 10/10/2018 A  A Pattern Final   Comment: . Risk: Optimal Pattern A; High Pattern B   . LDL Peak Size 10/10/2018 220.5  > OR = 217 Angstrom Final   Comment: . Risk: Optimal >222.9; Moderate 222.9-217.4; High <217.4 . Adult cardiovascular event risk category cut points (optimal, moderate, high) are based on adult [place] reference population. Association between lipoprotein subfractions and cardiovascular events is based on Musunuru et al. Christie Nottingham. 5462;70:3500. Marland Kitchen For additional information, please refer to http://education.[facility].com/faq/FAQ134 (This link is being provided for informational/educational purposes only.) . This test was developed and its analytical performance characteristics have been determined by [facility]. It has not been cleared or approved by FDA. This assay has been validated pursuant to the CLIA regulations  and is used for clinical purposes. .   . Apolipoprotein B 10/10/2018 52  mg/dL Final   Comment: . Reference Range: <90 . Risk Category: Optimal    <90 Moderate   90-119 High       > or = 120 . Cardiovascular event risk category cut points (optimal, moderate, high) are based on National Lipid Association recommendations - Yates Decamp TA et al. J of Clin Lipid. 9381;8:299-371 and Jellinger PS et al. [facility] [place] Pract. 2017;23(Suppl 2):1-87.   . Lipoprotein (a) 10/10/2018 20  <75 nmol/L Final   Comment: . Risk:  Optimal < 75 nmol/L; Moderate 75-125 nmol/L; High > 125 nmol/L Cardiovascular event risk category cut points (optimal, moderate, high) are based on Marcovina et al. Clin Chem. 5400;86:7619 and Nordestgaard et al. European Heart J. [phone] (results of meta-analysis and expert panel recommendations).    Past Medical History:  Diagnosis Date  . ADHD (attention deficit hyperactivity disorder) 09/07/2011  . Carotid artery occlusion   . Coronary artery calcification seen on CAT scan   . Coronary artery disease   . 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 DVT (deep venous thrombosis) ([place]) 09/07/2011  . Hypothyroid 09/07/2011  . Smoker 02/17/2013   Past Surgical History:  Procedure Laterality Date  . ABDOMINAL HYSTERECTOMY    . BREAST LUMPECTOMY  March 2013   Left breast  . EYE SURGERY Right Sept. 2014   Lasix - Laser   Current Outpatient Medications on File Prior to Visit  Medication Sig Dispense Refill  . amphetamine-dextroamphetamine (ADDERALL) 10 MG tablet Take 1 tablet (10 mg total) by mouth 2 (two) times daily. 60 tablet 0  . B Complex-C (B-COMPLEX WITH VITAMIN C) tablet Take 1 tablet by mouth daily.     . cholecalciferol (VITAMIN D) 1000 UNITS tablet Take 1,000 Units by mouth daily.    Marland Kitchen levothyroxine (SYNTHROID, LEVOTHROID) 75 MCG tablet TAKE 1 TABLET DAILY BEFORE BREAKFAST 90 tablet 2  . Multiple Vitamin (MULTIVITAMIN) tablet Take 1 tablet by mouth  daily.      . naproxen (NAPROSYN) 375 MG tablet TAKE 1 TABLET TWO TIMES DAILY WITH A MEAL 180 tablet 4  . pantoprazole (PROTONIX) 40 MG tablet TAKE 1 TABLET TWICE A DAY 180 tablet 4  . rosuvastatin (CRESTOR) 10 MG tablet TAKE 1 TABLET DAILY 90 tablet 4   No current facility-administered medications on file prior to visit.    Allergies  Allergen Reactions  . Penicillins Hives   Social History   Socioeconomic History  . Marital status: Widowed    Spouse name: Not on file  . Number of children: Not on file  . Years of education: Not on file  . Highest education level: Not on file  Occupational History  . Not on file  Social Needs  . Financial resource strain: Not on file  . Food insecurity:    Worry: Not on file    Inability: Not on file  . Transportation needs:    Medical: Not on file    Non-medical: Not on file  Tobacco Use  . Smoking status: Current Every Day Smoker    Packs/day: 1.00    Years: 40.00    Pack years: 40.00    Types: E-cigarettes, Cigarettes  . Smokeless tobacco: Never Used  Substance and Sexual Activity  . Alcohol use: No    Alcohol/week: 0.0 standard drinks  . Drug use: No  . Sexual activity: Never  Lifestyle  . Physical activity:    Days per week: Not on file    Minutes per session: Not on file  . Stress: Not on file  Relationships  . Social connections:    Talks on phone: Not on file    Gets together: Not on file    Attends religious service: Not on file    Active member of club or organization: Not on file    Attends meetings of clubs or organizations: Not on file    Relationship status: Not on file  . Intimate partner violence:    Fear of current or ex partner: Not on file  Emotionally abused: Not on file    Physically abused: Not on file    Forced sexual activity: Not on file  Other Topics Concern  . Not on file  Social History Narrative  . Not on file   Family History  Problem Relation Age of Onset  . Cancer Father         Stomach  . Crohn's disease Daughter       Review of Systems  All other systems reviewed and are negative.      Objective:   Physical Exam  Constitutional: She is oriented to person, place, and time. She appears well-developed and well-nourished. No distress.  HENT:  Head: Normocephalic and atraumatic.  Right Ear: External ear normal.  Left Ear: External ear normal.  Nose: Nose normal.  Mouth/Throat: Oropharynx is clear and moist. No oropharyngeal exudate.  Eyes: Pupils are equal, round, and reactive to light. Conjunctivae and EOM are normal. Right eye exhibits no discharge. Left eye exhibits no discharge. No scleral icterus.  Neck: Normal range of motion. Neck supple. No JVD present. No tracheal deviation present. No thyromegaly present.  Cardiovascular: Normal rate, regular rhythm, normal heart sounds and intact distal pulses. Exam reveals no gallop and no friction rub.  No murmur heard. Pulmonary/Chest: Effort normal and breath sounds normal. No stridor. No respiratory distress. She has no wheezes. She has no rales. She exhibits no tenderness.  Abdominal: Soft. Bowel sounds are normal. She exhibits no distension and no mass. There is no abdominal tenderness. There is no rebound and no guarding.  Musculoskeletal: Normal range of motion.        General: No tenderness or edema.  Lymphadenopathy:    She has no cervical adenopathy.  Neurological: She is alert and oriented to person, place, and time. She has normal reflexes. No cranial nerve deficit. She exhibits normal muscle tone. Coordination normal.  Skin: Skin is warm. No rash noted. She is not diaphoretic. No erythema. No pallor.  Psychiatric: She has a normal mood and affect. Her behavior is normal. Judgment and thought content normal.  Vitals reviewed.         Assessment & Plan:  Postmenopausal estrogen deficiency - Plan: DG Bone Density  Encounter for screening for malignant neoplasm of respiratory organs - Plan: CT  CHEST LUNG CA SCREEN LOW DOSE W/O CM  Encounter for screening for lung cancer - Plan: CT CHEST LUNG CA SCREEN LOW DOSE W/O CM  ASCVD (arteriosclerotic cardiovascular disease)  Annual physical exam  Smoker  Hypothyroidism, unspecified type  Patient's physical exam is completely normal aside from her elevated blood pressure however this seems to be a false positive as her blood pressure at home is well controlled.  She is already scheduled her mammogram.  Her colonoscopy is up-to-date.  I will schedule the patient for a bone density test.  I will also schedule the patient for lung cancer screening with low-dose CT scan.  Continue to encourage smoking cessation and I discussed Chantix however she is not interested at the present time.  Reviewed her lab work with her in detail.  Cholesterol is excellent.  Thyroid test is normal.  Therefore I will make no changes in her medication at this time.  We discussed weaning off Adderall or discontinuing it altogether however she declines this at the present time as she still requires the medication to function at work.

## 2018-11-07 ENCOUNTER — Ambulatory Visit
Admission: RE | Admit: 2018-11-07 | Discharge: 2018-11-07 | Disposition: A | Discharge status: Home / Self Care | Source: Ambulatory Visit | Attending: Family Medicine | Admitting: Family Medicine

## 2018-11-07 DIAGNOSIS — Z122 Encounter for screening for malignant neoplasm of respiratory organs: Secondary | ICD-10-CM

## 2018-11-09 ENCOUNTER — Encounter: Payer: Self-pay | Admitting: Family Medicine

## 2018-11-09 DIAGNOSIS — F909 Attention-deficit hyperactivity disorder, unspecified type: Secondary | ICD-10-CM

## 2018-11-11 MED ORDER — AMPHETAMINE-DEXTROAMPHETAMINE 10 MG PO TABS: 10.0000 mg | ORAL_TABLET | Freq: Two times a day (BID) | ORAL | 0 refills | Status: DC

## 2018-11-11 NOTE — Telephone Encounter (Signed)
Ok to refill??  Last office visit 10/21/2018.  Last refill 09/27/2018.

## 2018-11-16 ENCOUNTER — Other Ambulatory Visit: Payer: Self-pay | Admitting: Family Medicine

## 2018-12-09 ENCOUNTER — Encounter: Payer: Self-pay | Admitting: Family Medicine

## 2018-12-09 DIAGNOSIS — F909 Attention-deficit hyperactivity disorder, unspecified type: Secondary | ICD-10-CM

## 2018-12-09 MED ORDER — AMPHETAMINE-DEXTROAMPHETAMINE 10 MG PO TABS: 10.0000 mg | ORAL_TABLET | Freq: Two times a day (BID) | ORAL | 0 refills | Status: DC

## 2018-12-09 NOTE — Telephone Encounter (Signed)
Ok to refill??  Last office visit 10/21/2018.  Last refill 11/11/2018.

## 2019-01-10 ENCOUNTER — Encounter: Payer: Self-pay | Admitting: Family Medicine

## 2019-01-10 DIAGNOSIS — F909 Attention-deficit hyperactivity disorder, unspecified type: Secondary | ICD-10-CM

## 2019-01-13 MED ORDER — AMPHETAMINE-DEXTROAMPHETAMINE 10 MG PO TABS: 10.0000 mg | ORAL_TABLET | Freq: Two times a day (BID) | ORAL | 0 refills | Status: DC

## 2019-01-13 NOTE — Telephone Encounter (Signed)
Ok to refill??  Last office visit 10/21/2018.  Last refill 12/09/2018.

## 2019-02-10 ENCOUNTER — Encounter: Payer: Self-pay | Admitting: Family Medicine

## 2019-02-10 DIAGNOSIS — F909 Attention-deficit hyperactivity disorder, unspecified type: Secondary | ICD-10-CM

## 2019-02-10 MED ORDER — AMPHETAMINE-DEXTROAMPHETAMINE 10 MG PO TABS: 10.0000 mg | ORAL_TABLET | Freq: Two times a day (BID) | ORAL | 0 refills | Status: DC

## 2019-02-10 NOTE — Telephone Encounter (Signed)
1. Ok to refill?? Last office visit 10/21/2018. Last refill 01/13/2019.  2. Do you need to see her for the Stye?

## 2019-02-11 ENCOUNTER — Other Ambulatory Visit: Payer: Self-pay | Admitting: Family Medicine

## 2019-02-11 ENCOUNTER — Encounter: Payer: Self-pay | Admitting: Family Medicine

## 2019-02-11 MED ORDER — ERYTHROMYCIN 5 MG/GM OP OINT: 1.0000 "application " | TOPICAL_OINTMENT | Freq: Every day | OPHTHALMIC | 0 refills | Status: DC

## 2019-03-20 ENCOUNTER — Encounter: Payer: Self-pay | Admitting: Family Medicine

## 2019-03-20 DIAGNOSIS — F909 Attention-deficit hyperactivity disorder, unspecified type: Secondary | ICD-10-CM

## 2019-03-20 MED ORDER — AMPHETAMINE-DEXTROAMPHETAMINE 10 MG PO TABS
10.0000 mg | ORAL_TABLET | Freq: Two times a day (BID) | ORAL | 0 refills | Status: DC
Start: 2019-03-20 — End: 2019-04-28

## 2019-03-20 NOTE — Telephone Encounter (Signed)
Pt requesting refill on Adderall      LOV: 10/21/18  LRF:  02/10/19

## 2019-04-18 ENCOUNTER — Ambulatory Visit
Admission: RE | Admit: 2019-04-18 | Discharge: 2019-04-18 | Disposition: A | Discharge status: Home / Self Care | Source: Ambulatory Visit | Attending: Family Medicine | Admitting: Family Medicine

## 2019-04-18 ENCOUNTER — Ambulatory Visit
Admission: RE | Admit: 2019-04-18 | Discharge: 2019-04-18 | Disposition: A | Discharge status: Home / Self Care | Source: Ambulatory Visit | Attending: Adult Health | Admitting: Adult Health

## 2019-04-18 DIAGNOSIS — Z780 Asymptomatic menopausal state: Secondary | ICD-10-CM

## 2019-04-18 DIAGNOSIS — Z1239 Encounter for other screening for malignant neoplasm of breast: Secondary | ICD-10-CM

## 2019-04-28 ENCOUNTER — Encounter: Payer: Self-pay | Admitting: Family Medicine

## 2019-04-28 DIAGNOSIS — F909 Attention-deficit hyperactivity disorder, unspecified type: Secondary | ICD-10-CM

## 2019-04-28 MED ORDER — AMPHETAMINE-DEXTROAMPHETAMINE 10 MG PO TABS: 10.0000 mg | ORAL_TABLET | Freq: Two times a day (BID) | ORAL | 0 refills | Status: DC

## 2019-04-28 NOTE — Telephone Encounter (Signed)
Pt requesting refill on Adderall      LOV: 10/21/18  LRF:   03/20/19

## 2019-05-28 ENCOUNTER — Encounter: Payer: Self-pay | Admitting: Family Medicine

## 2019-05-28 DIAGNOSIS — F909 Attention-deficit hyperactivity disorder, unspecified type: Secondary | ICD-10-CM

## 2019-05-28 NOTE — Telephone Encounter (Signed)
Ok to refill??  Last office visit 10/21/2018.  Last refill 04/28/2019.

## 2019-05-29 MED ORDER — AMPHETAMINE-DEXTROAMPHETAMINE 10 MG PO TABS: 10.0000 mg | ORAL_TABLET | Freq: Two times a day (BID) | ORAL | 0 refills | Status: DC

## 2019-06-27 ENCOUNTER — Encounter: Payer: Self-pay | Admitting: Family Medicine

## 2019-06-27 DIAGNOSIS — F909 Attention-deficit hyperactivity disorder, unspecified type: Secondary | ICD-10-CM

## 2019-06-27 MED ORDER — AMPHETAMINE-DEXTROAMPHETAMINE 10 MG PO TABS: 10.0000 mg | ORAL_TABLET | Freq: Two times a day (BID) | ORAL | 0 refills | Status: DC

## 2019-06-27 NOTE — Telephone Encounter (Signed)
Ok to refill??  Last office visit 10/21/2018.  Last refill 05/29/2019.

## 2019-07-24 ENCOUNTER — Encounter: Payer: Self-pay | Admitting: Family Medicine

## 2019-07-24 DIAGNOSIS — F909 Attention-deficit hyperactivity disorder, unspecified type: Secondary | ICD-10-CM

## 2019-07-25 MED ORDER — AMPHETAMINE-DEXTROAMPHETAMINE 10 MG PO TABS: 10.0000 mg | ORAL_TABLET | Freq: Two times a day (BID) | ORAL | 0 refills | Status: DC

## 2019-07-25 NOTE — Telephone Encounter (Signed)
Ok to refill??  Last office visit 10/21/2018.  Last refill 06/27/2019.

## 2019-08-22 ENCOUNTER — Other Ambulatory Visit: Payer: Self-pay | Admitting: Family Medicine

## 2019-08-22 ENCOUNTER — Encounter: Payer: Self-pay | Admitting: Family Medicine

## 2019-08-22 MED ORDER — AZITHROMYCIN 250 MG PO TABS: ORAL_TABLET | ORAL | 0 refills | Status: DC

## 2019-08-25 ENCOUNTER — Encounter: Payer: Self-pay | Admitting: Family Medicine

## 2019-08-25 DIAGNOSIS — F909 Attention-deficit hyperactivity disorder, unspecified type: Secondary | ICD-10-CM

## 2019-08-25 MED ORDER — AMPHETAMINE-DEXTROAMPHETAMINE 10 MG PO TABS: 10.0000 mg | ORAL_TABLET | Freq: Two times a day (BID) | ORAL | 0 refills | Status: DC

## 2019-08-25 NOTE — Telephone Encounter (Signed)
Pt needs OV with Dr. Dennard Schaumann

## 2019-08-25 NOTE — Telephone Encounter (Signed)
Ok to refill?? [place] patient.  Last office visit 10/21/2018.  Last refill 07/25/2019.

## 2019-09-14 ENCOUNTER — Encounter: Payer: Self-pay | Admitting: Family Medicine

## 2019-09-22 ENCOUNTER — Encounter: Payer: Self-pay | Admitting: Family Medicine

## 2019-09-22 DIAGNOSIS — F909 Attention-deficit hyperactivity disorder, unspecified type: Secondary | ICD-10-CM

## 2019-09-22 MED ORDER — AMPHETAMINE-DEXTROAMPHETAMINE 10 MG PO TABS: 10.0000 mg | ORAL_TABLET | Freq: Two times a day (BID) | ORAL | 0 refills | Status: DC

## 2019-09-22 NOTE — Telephone Encounter (Signed)
Ok to refill??  Last office visit 10/21/2018.  Last refill 08/25/2019.

## 2019-10-13 ENCOUNTER — Other Ambulatory Visit: Payer: Self-pay

## 2019-10-13 ENCOUNTER — Other Ambulatory Visit

## 2019-10-13 ENCOUNTER — Encounter: Payer: Self-pay | Admitting: Family Medicine

## 2019-10-13 DIAGNOSIS — E039 Hypothyroidism, unspecified: Secondary | ICD-10-CM

## 2019-10-13 DIAGNOSIS — I2510 Atherosclerotic heart disease of native coronary artery without angina pectoris: Secondary | ICD-10-CM

## 2019-10-13 DIAGNOSIS — Z0000 Encounter for general adult medical examination without abnormal findings: Secondary | ICD-10-CM

## 2019-10-14 LAB — COMPLETE METABOLIC PANEL WITH GFR
AG Ratio: 1.6 (calc) (ref 1.0–2.5)
ALT: 21 U/L (ref 6–29)
AST: 26 U/L (ref 10–35)
Albumin: 4.1 g/dL (ref 3.6–5.1)
Alkaline phosphatase (APISO): 84 U/L (ref 37–153)
BUN: 15 mg/dL (ref 7–25)
CO2: 23 mmol/L (ref 20–32)
Calcium: 9.6 mg/dL (ref 8.6–10.4)
Chloride: 105 mmol/L (ref 98–110)
Creat: 0.66 mg/dL (ref 0.50–0.99)
GFR, Est African American: 109 mL/min/{1.73_m2} (ref >=60)
GFR, Est Non African American: 94 mL/min/{1.73_m2} (ref >=60)
Globulin: 2.5 g/dL (calc) (ref 1.9–3.7)
Glucose, Bld: 93 mg/dL (ref 65–99)
Potassium: 4.4 mmol/L (ref 3.5–5.3)
Sodium: 138 mmol/L (ref 135–146)
Total Bilirubin: 0.3 mg/dL (ref 0.2–1.2)
Total Protein: 6.6 g/dL (ref 6.1–8.1)

## 2019-10-14 LAB — CBC WITH DIFFERENTIAL/PLATELET
Absolute Monocytes: 483 cells/uL (ref 200–950)
Basophils Absolute: 82 cells/uL (ref 0–200)
Basophils Relative: 1.2 %
Eosinophils Absolute: 292 cells/uL (ref 15–500)
Eosinophils Relative: 4.3 %
HCT: 32.2 % — ABNORMAL LOW (ref 35.0–45.0)
Hemoglobin: 10 g/dL — ABNORMAL LOW (ref 11.7–15.5)
Lymphs Abs: 1544 cells/uL (ref 850–3900)
MCH: 27.4 pg (ref 27.0–33.0)
MCHC: 31.1 g/dL — ABNORMAL LOW (ref 32.0–36.0)
MCV: 88.2 fL (ref 80.0–100.0)
MPV: 9.9 fL (ref 7.5–12.5)
Monocytes Relative: 7.1 %
Neutro Abs: 4400 cells/uL (ref 1500–7800)
Neutrophils Relative %: 64.7 %
Platelets: 658 10*3/uL — ABNORMAL HIGH (ref 140–400)
RBC: 3.65 10*6/uL — ABNORMAL LOW (ref 3.80–5.10)
RDW: 13.1 % (ref 11.0–15.0)
Total Lymphocyte: 22.7 %
WBC: 6.8 10*3/uL (ref 3.8–10.8)

## 2019-10-14 LAB — TSH: TSH: 1.36 mIU/L (ref 0.40–4.50)

## 2019-10-18 LAB — CARDIO IQ(R) ADVANCED LIPID PANEL
Apolipoprotein B: 55 mg/dL
Cholesterol: 153 mg/dL (ref <200)
HDL: 83 mg/dL (ref >=50)
LDL Cholesterol (Calc): 55 mg/dL (calc) (ref <100)
LDL Large: 6824 nmol/L (ref >6729)
LDL Medium: 148 nmol/L (ref <215)
LDL Particle Number: 885 nmol/L (ref <1138)
LDL Peak Size: 218.7 Angstrom — ABNORMAL LOW (ref >222.9)
LDL Small: 147 nmol/L — ABNORMAL HIGH (ref <142)
Lipoprotein (a): 24 nmol/L (ref <75)
Non-HDL Cholesterol (Calc): 70 mg/dL (calc) (ref <130)
Total CHOL/HDL Ratio: 1.8 calc (ref <5.0)
Triglycerides: 73 mg/dL (ref <150)

## 2019-10-22 ENCOUNTER — Encounter: Payer: Self-pay | Admitting: Family Medicine

## 2019-10-22 DIAGNOSIS — F909 Attention-deficit hyperactivity disorder, unspecified type: Secondary | ICD-10-CM

## 2019-10-22 NOTE — Telephone Encounter (Signed)
Pt requesting refill on Adderall      LOV: 10/21/18  LRF:   09/22/19 Has apt today (10/23/19)

## 2019-10-23 ENCOUNTER — Encounter: Payer: Self-pay | Admitting: Family Medicine

## 2019-10-23 ENCOUNTER — Other Ambulatory Visit: Payer: Self-pay

## 2019-10-23 ENCOUNTER — Encounter: Payer: Self-pay | Admitting: Adult Health

## 2019-10-23 ENCOUNTER — Inpatient Hospital Stay: Attending: Adult Health | Admitting: Adult Health

## 2019-10-23 ENCOUNTER — Ambulatory Visit (INDEPENDENT_AMBULATORY_CARE_PROVIDER_SITE_OTHER): Admitting: Family Medicine

## 2019-10-23 VITALS — BP 133/73 | HR 86 | Temp 98.3°F | Resp 18 | Ht 65.5 in | Wt 127.3 lb

## 2019-10-23 VITALS — BP 170/94 | HR 84 | Temp 97.7°F | Resp 16 | Ht 65.5 in | Wt 127.0 lb

## 2019-10-23 DIAGNOSIS — Z23 Encounter for immunization: Secondary | ICD-10-CM | POA: Diagnosis not present

## 2019-10-23 DIAGNOSIS — Z86718 Personal history of other venous thrombosis and embolism: Secondary | ICD-10-CM | POA: Diagnosis not present

## 2019-10-23 DIAGNOSIS — F17200 Nicotine dependence, unspecified, uncomplicated: Secondary | ICD-10-CM

## 2019-10-23 DIAGNOSIS — Z79899 Other long term (current) drug therapy: Secondary | ICD-10-CM | POA: Diagnosis not present

## 2019-10-23 DIAGNOSIS — Z853 Personal history of malignant neoplasm of breast: Secondary | ICD-10-CM | POA: Diagnosis not present

## 2019-10-23 DIAGNOSIS — F17210 Nicotine dependence, cigarettes, uncomplicated: Secondary | ICD-10-CM | POA: Insufficient documentation

## 2019-10-23 DIAGNOSIS — Z791 Long term (current) use of non-steroidal anti-inflammatories (NSAID): Secondary | ICD-10-CM | POA: Insufficient documentation

## 2019-10-23 DIAGNOSIS — Z0000 Encounter for general adult medical examination without abnormal findings: Secondary | ICD-10-CM

## 2019-10-23 DIAGNOSIS — D508 Other iron deficiency anemias: Secondary | ICD-10-CM | POA: Diagnosis not present

## 2019-10-23 DIAGNOSIS — Z170 Estrogen receptor positive status [ER+]: Secondary | ICD-10-CM | POA: Diagnosis not present

## 2019-10-23 DIAGNOSIS — M8580 Other specified disorders of bone density and structure, unspecified site: Secondary | ICD-10-CM | POA: Diagnosis not present

## 2019-10-23 DIAGNOSIS — F909 Attention-deficit hyperactivity disorder, unspecified type: Secondary | ICD-10-CM | POA: Diagnosis not present

## 2019-10-23 DIAGNOSIS — C50412 Malignant neoplasm of upper-outer quadrant of left female breast: Secondary | ICD-10-CM

## 2019-10-23 DIAGNOSIS — Z90710 Acquired absence of both cervix and uterus: Secondary | ICD-10-CM | POA: Insufficient documentation

## 2019-10-23 DIAGNOSIS — Z800 Family history of malignant neoplasm of digestive organs: Secondary | ICD-10-CM | POA: Diagnosis not present

## 2019-10-23 DIAGNOSIS — E039 Hypothyroidism, unspecified: Secondary | ICD-10-CM | POA: Diagnosis not present

## 2019-10-23 DIAGNOSIS — I2510 Atherosclerotic heart disease of native coronary artery without angina pectoris: Secondary | ICD-10-CM

## 2019-10-23 DIAGNOSIS — Z923 Personal history of irradiation: Secondary | ICD-10-CM | POA: Diagnosis not present

## 2019-10-23 MED ORDER — AMPHETAMINE-DEXTROAMPHETAMINE 10 MG PO TABS: 10.0000 mg | ORAL_TABLET | Freq: Two times a day (BID) | ORAL | 0 refills | Status: DC

## 2019-10-23 MED ORDER — HYDROCHLOROTHIAZIDE 25 MG PO TABS: 25.0000 mg | ORAL_TABLET | Freq: Every day | ORAL | 3 refills | Status: DC

## 2019-10-23 NOTE — Progress Notes (Signed)
CLINIC:  Survivorship   REASON FOR VISIT:  Routine follow-up for history of breast cancer.   BRIEF ONCOLOGIC HISTORY:  Oncology History  Breast cancer of upper-outer quadrant of left female breast ([place])  01/30/2007 Surgery   Left breast lumpectomy: High-grade DCIS with focal necrosis 1.7 cm ER 54%, PR 74%   03/26/2007 - 05/13/2007 Radiation Therapy   Adjuvant radiation therapy   06/05/2007 - 06/05/2012 Anti-estrogen oral therapy   Arimidex 1 mg daily      INTERVAL HISTORY:  Ms. Dilts presents to the [facility] today for routine follow-up for her history of breast cancer.    Her most recent mammogram was on 04/18/2019 and showed no mammographic evidence of malignancy and breast density category B.  She also underwent bone density testing on 04/18/2019 that demonstrated osteopenia with a T score of -1.7 in the right femur.    She continues to see her PCP Dr. Dennard Schaumann and undergoes annual lung cancer screening for her 40 pack year tobacco history most recently completed in 11/2018 and negative.  Repeat was recommended for 11/2019.   She is up to date with her colon cancer screening.  She has not had her skin examined.  She no longer needs gyn exams as she is s/p TAH.    REVIEW OF SYSTEMS:  Review of Systems  Constitutional: Negative for appetite change, chills, fatigue, fever and unexpected weight change.  HENT:   Negative for hearing loss, lump/mass, sore throat and trouble swallowing.   Eyes: Negative for eye problems and icterus.  Respiratory: Negative for chest tightness, cough and shortness of breath.   Cardiovascular: Negative for chest pain, leg swelling and palpitations.  Gastrointestinal: Negative for abdominal distention, abdominal pain, constipation, diarrhea, nausea and vomiting.  Endocrine: Negative for hot flashes.  Genitourinary: Negative for difficulty urinating.   Musculoskeletal: Negative for arthralgias.  Skin: Negative for itching and rash.  Neurological:  Negative for dizziness.  Hematological: Negative for adenopathy. Does not bruise/bleed easily.  Psychiatric/Behavioral: Negative for depression. The patient is not nervous/anxious.   Breast: Denies any new nodularity, masses, tenderness, nipple changes, or nipple discharge.       PAST MEDICAL/SURGICAL HISTORY:  Past Medical History:  Diagnosis Date  . ADHD (attention deficit hyperactivity disorder) 09/07/2011  . Carotid artery occlusion   . Coronary artery calcification seen on CAT scan   . Coronary artery disease   . 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 DVT (deep venous thrombosis) ([place]) 09/07/2011  . Hypothyroid 09/07/2011  . Smoker 02/17/2013   Past Surgical History:  Procedure Laterality Date  . ABDOMINAL HYSTERECTOMY    . BREAST LUMPECTOMY  March 2013   Left breast  . EYE SURGERY Right Sept. 2014   Lasix - Laser     ALLERGIES:  Allergies  Allergen Reactions  . Penicillins Hives     CURRENT MEDICATIONS:  Outpatient Encounter Medications as of 10/23/2019  Medication Sig  . amphetamine-dextroamphetamine (ADDERALL) 10 MG tablet Take 1 tablet (10 mg total) by mouth 2 (two) times daily.  . B Complex-C (B-COMPLEX WITH VITAMIN C) tablet Take 1 tablet by mouth daily.   . cholecalciferol (VITAMIN D) 1000 UNITS tablet Take 1,000 Units by mouth daily.  . hydrochlorothiazide (HYDRODIURIL) 25 MG tablet Take 1 tablet (25 mg total) by mouth daily.  Marland Kitchen levothyroxine (SYNTHROID, LEVOTHROID) 75 MCG tablet TAKE 1 TABLET DAILY BEFORE BREAKFAST  . Multiple Vitamin (MULTIVITAMIN) tablet Take 1 tablet by mouth daily.    . naproxen (  NAPROSYN) 375 MG tablet TAKE 1 TABLET TWO TIMES DAILY WITH A MEAL  . pantoprazole (PROTONIX) 40 MG tablet TAKE 1 TABLET TWICE A DAY  . rosuvastatin (CRESTOR) 10 MG tablet TAKE 1 TABLET DAILY   No facility-administered encounter medications on file as of 10/23/2019.     ONCOLOGIC FAMILY HISTORY:  Family History  Problem Relation Age of Onset   . Cancer Father        Stomach  . Crohn's disease Daughter     GENETIC COUNSELING/TESTING: Not at this time  SOCIAL HISTORY:  ESTELLE MANTILLA is divorced and lives with her daughter and grandson in [place], [place].  She has another daughter who lives on her own in [place].  Ms. 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  She denies any current or history of or illicit drug use.  She is a current every day smoker of 1ppd for 30+ years.  Not ready to quit today.  Occasional drink of beer.  (updated and reviewed on 10/23/2019)   PHYSICAL EXAMINATION:  Vital Signs: Vitals:   10/23/19 1519  BP: 133/73  Pulse: 86  Resp: 18  Temp: 98.3 F (36.8 C)  SpO2: 100%   Filed Weights   10/23/19 1519  Weight: 127 lb 4.8 oz (57.7 kg)   General: Well-nourished, well-appearing female in no acute distress.  Unaccompanied today.   HEENT: Head is normocephalic.  Pupils equal and reactive to light. Conjunctivae clear without exudate.  Sclerae anicteric. Oral mucosa is pink, moist.  Oropharynx is pink without lesions or erythema.  Lymph: No cervical, supraclavicular, or infraclavicular lymphadenopathy noted on palpation.  Cardiovascular: Regular rate and rhythm.Marland Kitchen Respiratory: Clear to auscultation bilaterally. Chest expansion symmetric; breathing non-labored.  Breast Exam:  -Left breast: No appreciable masses on palpation. No skin redness, thickening, or peau d'orange appearance; no nipple retraction or nipple discharge; mild distortion in symmetry at previous lumpectomy site well healed scar without erythema or nodularity.  -Right breast: No appreciable masses on palpation. No skin redness, thickening, or peau d'orange appearance; no nipple retraction or nipple discharge; -Axilla: No axillary adenopathy bilaterally.  GI: Abdomen soft and round; non-tender, non-distended. Bowel sounds normoactive. No hepatosplenomegaly.   GU:  Deferred.  Neuro: No focal deficits. Steady gait.  Psych: Mood and affect normal and appropriate for situation.  MSK: No focal spinal tenderness to palpation, full range of motion in bilateral upper extremities Extremities: No edema. Skin: Warm and dry.  LABORATORY DATA:  None for this visit   DIAGNOSTIC IMAGING:  Most recent mammogram:  CLINICAL DATA:  Screening.  EXAM: DIGITAL SCREENING BILATERAL MAMMOGRAM WITH TOMO AND CAD  COMPARISON:  Previous exam(s).  ACR Breast Density Category b: There are scattered areas of fibroglandular density.  FINDINGS: There are no findings suspicious for malignancy. Images were processed with CAD.  IMPRESSION: No mammographic evidence of malignancy. A result letter of this screening mammogram will be mailed directly to the patient.  RECOMMENDATION: Screening mammogram in one year. (Code:[id number]-B-01Y)  BI-RADS CATEGORY  1: Negative.   Electronically Signed   By: Curlene Dolphin M.D.   On: 04/18/2019 15:15    ASSESSMENT AND PLAN:  Ms.. Vernier is a pleasant 64 y.o. 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  [facility] for [facility] and routine follow-up.  1. History of breast cancer:  Ms. Schoenberger is currently clinically and radiographically without evidence of disease or recurrence of breast cancer.  She will be due for repeat mammgoram in 04/2020.  She will return to see [place] next year for LTS follow up.   I encouraged her to call me with any questions or concerns before her next visit at the cancer center, and I would be happy to see her sooner, if needed.    2. Bone health:  Given Ms. Hornbeck's age, history of breast cancer, and her previous anti-estrogen therapy with Anastrozole, she is at risk for bone demineralization. She underwent  repeat bone density testing that demonstrated osteopenia.  I let her know that smoking increases her risk of osteopenia. She was given education on specific food and activities to promote bone health.  3. Cancer screening:  Due to Ms. Licausi's history and her age, she should receive screening for skin cancers, colon cancer, lung cancer and gynecologic cancers.  She was encouraged to follow-up with her PCP for appropriate cancer screenings.   4. Health maintenance and wellness promotion: Ms. Grisez was encouraged to consume 5-7 servings of fruits and vegetables per day. She was also encouraged to engage in moderate to vigorous exercise for 30 minutes per day most days of the week. She was instructed to limit her alcohol consumption and stop smoking.    Dispo:  -Return to cancer center in one year for LTS follow up -Mammogram in 04/2020   Time on this encounter, 20 minutes   Gardenia Phlegm, [place] [phone]   Note: PRIMARY CARE PROVIDER [name], [place] [phone]

## 2019-10-23 NOTE — Progress Notes (Signed)
Subjective:    Patient ID: [name], female    DOB: 1956-09-18, 64 y.o.   MRN: [id number]  HPI Patient is here today for complete physical exam.  Her last mammogram July.  She has a history of a total abdominal hysterectomy and therefore does not require Pap smear.  Her last colonoscopy was performed in 2017 and did show one polyp.  Gastroenterology recommended a repeat colonoscopy in 5 years that she is due in 2022.  Patient had a bone density test last year which showed a T score of -1.7.  She is currently taking vitamin D but she is not taking any calcium.  Unfortunately this year, her youngest daughter died from a drug overdose.  Her oldest daughter has also been extremely sick and in hospital.  However the patient seems to be handling the stress quite well.  She is due today for a flu shot.  Her blood pressure is concerningly high at 170/94.  Patient has a history of carotid artery stenosis.  However she denies taking any aspirin.  Was concerning to me is that her hemoglobin has dropped from 12-10 and she has no history of any bleeding or melena or hematochezia.  She denies any bruising.  Most recent lab work is listed below. Lab on 10/13/2019  Component Date Value Ref Range Status  . TSH 10/13/2019 1.36  0.40 - 4.50 mIU/L Final  . Glucose, Bld 10/13/2019 93  65 - 99 mg/dL Final   Comment: .            Fasting reference interval .   . BUN 10/13/2019 15  7 - 25 mg/dL Final  . Creat 10/13/2019 0.66  0.50 - 0.99 mg/dL Final   Comment: For patients >100 years of age, the reference limit for Creatinine is approximately 13% higher for people identified as African-American. .   . GFR, Est Non African American 10/13/2019 94  > OR = 60 mL/min/1.35m2 Final  . GFR, Est African American 10/13/2019 109  > OR = 60 mL/min/1.80m2 Final  . BUN/Creatinine Ratio [id number] NOT APPLICABLE  6 - 22 (calc) Final  . Sodium 10/13/2019 138  135 - 146 mmol/L Final  . Potassium 10/13/2019 4.4  3.5 - 5.3  mmol/L Final  . Chloride 10/13/2019 105  98 - 110 mmol/L Final  . CO2 10/13/2019 23  20 - 32 mmol/L Final  . Calcium 10/13/2019 9.6  8.6 - 10.4 mg/dL Final  . Total Protein 10/13/2019 6.6  6.1 - 8.1 g/dL Final  . Albumin 10/13/2019 4.1  3.6 - 5.1 g/dL Final  . Globulin 10/13/2019 2.5  1.9 - 3.7 g/dL (calc) Final  . AG Ratio 10/13/2019 1.6  1.0 - 2.5 (calc) Final  . Total Bilirubin 10/13/2019 0.3  0.2 - 1.2 mg/dL Final  . Alkaline phosphatase (APISO) 10/13/2019 84  37 - 153 U/L Final  . AST 10/13/2019 26  10 - 35 U/L Final  . ALT 10/13/2019 21  6 - 29 U/L Final  . WBC 10/13/2019 6.8  3.8 - 10.8 Thousand/uL Final  . RBC 10/13/2019 3.65* 3.80 - 5.10 Million/uL Final  . Hemoglobin 10/13/2019 10.0* 11.7 - 15.5 g/dL Final  . HCT 10/13/2019 32.2* 35.0 - 45.0 % Final  . MCV 10/13/2019 88.2  80.0 - 100.0 fL Final  . MCH 10/13/2019 27.4  27.0 - 33.0 pg Final  . MCHC 10/13/2019 31.1* 32.0 - 36.0 g/dL Final  . RDW 10/13/2019 13.1  11.0 - 15.0 % Final  .  Platelets 10/13/2019 658* 140 - 400 Thousand/uL Final  . MPV 10/13/2019 9.9  7.5 - 12.5 fL Final  . Neutro Abs 10/13/2019 4,400  1,500 - 7,800 cells/uL Final  . Lymphs Abs 10/13/2019 1,544  850 - 3,900 cells/uL Final  . Absolute Monocytes 10/13/2019 483  200 - 950 cells/uL Final  . Eosinophils Absolute 10/13/2019 292  15 - 500 cells/uL Final  . Basophils Absolute 10/13/2019 82  0 - 200 cells/uL Final  . Neutrophils Relative % 10/13/2019 64.7  % Final  . Total Lymphocyte 10/13/2019 22.7  % Final  . Monocytes Relative 10/13/2019 7.1  % Final  . Eosinophils Relative 10/13/2019 4.3  % Final  . Basophils Relative 10/13/2019 1.2  % Final  . Cholesterol 10/13/2019 153  <200 mg/dL Final  . HDL 10/13/2019 83  > OR = 50 mg/dL Final  . Triglycerides 10/13/2019 73  <150 mg/dL Final  . LDL Cholesterol (Calc) 10/13/2019 55  <100 mg/dL (calc) Final   Comment: . Desirable range <100 mg/dL for primary prevention; <70 mg/dL for patients with CHD or diabetic  patients with > or = 2 CHD risk factors. Marland Kitchen LDL-C is now calculated using the Martin-Hopkins calculation, which is a validated novel method providing better accuracy than the Friedewald equation in the estimation of LDL-C. Cresenciano Genre et al. Annamaria Helling. [id number]): 2061-2068 . For additional information, please refer to http://education.[facility].com/faq/FAQ164 (This link is being provided for informational/educational purposes only.)   . Total CHOL/HDL Ratio 10/13/2019 1.8  <5.0 calc Final  . Non-HDL Cholesterol (Calc) 10/13/2019 70  <130 mg/dL (calc) Final   Comment: . For patients with diabetes plus 1 major ASCVD risk factor, treating to a non-HDL-C goal of <100 mg/dL (LDL-C of <70 mg/dL) is considered a therapeutic option.   . LDL Particle Number 10/13/2019 885  <1,138 nmol/L Final   Comment: . Risk: Optimal <1138; Moderate [id number]; High >1409   . LDL Small 10/13/2019 147* <142 nmol/L Final   Comment: . Risk: Optimal <142; Moderate 142-219; High >219   . LDL Medium 10/13/2019 148  <215 nmol/L Final   Comment: . Risk: Optimal <215; Moderate 215-301; High >301   . LDL Large 10/13/2019 6,824  >6,729 nmol/L Final   Comment: . Risk: Optimal >6729; Moderate [id number]; High <5353   . LDL Pattern 10/13/2019 A  A Pattern Final   Comment: . Risk: Optimal Pattern A; High Pattern B   . LDL Peak Size 10/13/2019 218.7* >222.9 Angstrom Final   Comment: . Risk: Optimal >222.9; Moderate 222.9-217.4; High <217.4 . Adult cardiovascular event risk category cut points (optimal, moderate, high) are based on an adult [place] reference population plus two large cohort study populations. Association between lipoprotein subfractions and cardiovascular events is based on Musunuru et al. Christie Nottingham. [id number]. Marland Kitchen For additional information, please refer to http://education.[facility].com/faq/FAQ134 (This link is being provided for informational/educational purposes only.) . This  test was developed and its analytical performance characteristics have been determined by [facility]. It has not been cleared or approved by FDA. This assay has been validated pursuant to the CLIA regulations and is used for clinical purposes. .   . Apolipoprotein B 10/13/2019 55  mg/dL Final   Comment: . Reference Range: <90 . Risk Category: Optimal    <90 Moderate   90-119 High       > or = 120 . Cardiovascular event risk category cut points (optimal, moderate, high) are based on National Lipid Association recommendations - Yates Decamp TA et  al. Lenna Sciara of Clin Lipid. [id number] and Jellinger PS et al. [facility] [place] Pract. 2017;23(Suppl 2):1-87.   . Lipoprotein (a) 10/13/2019 24  <75 nmol/L Final   Comment: . Risk: Optimal < 75 nmol/L; Moderate 75-125 nmol/L; High > 125 nmol/L . Cardiovascular event risk category cut points (optimal, moderate, high) are based on [place] S.JACC [id number].    Past Medical History:  Diagnosis Date  . ADHD (attention deficit hyperactivity disorder) 09/07/2011  . Carotid artery occlusion   . Coronary artery calcification seen on CAT scan   . Coronary artery disease   . 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 DVT (deep venous thrombosis) ([place]) 09/07/2011  . Hypothyroid 09/07/2011  . Smoker 02/17/2013   Past Surgical History:  Procedure Laterality Date  . ABDOMINAL HYSTERECTOMY    . BREAST LUMPECTOMY  March 2013   Left breast  . EYE SURGERY Right Sept. 2014   Lasix - Laser   Current Outpatient Medications on File Prior to Visit  Medication Sig Dispense Refill  . amphetamine-dextroamphetamine (ADDERALL) 10 MG tablet Take 1 tablet (10 mg total) by mouth 2 (two) times daily. 60 tablet 0  . B Complex-C (B-COMPLEX WITH VITAMIN C) tablet Take 1 tablet by mouth daily.     . cholecalciferol (VITAMIN D) 1000 UNITS tablet Take 1,000 Units by mouth daily.    Marland Kitchen levothyroxine (SYNTHROID,  LEVOTHROID) 75 MCG tablet TAKE 1 TABLET DAILY BEFORE BREAKFAST 90 tablet 4  . Multiple Vitamin (MULTIVITAMIN) tablet Take 1 tablet by mouth daily.      . naproxen (NAPROSYN) 375 MG tablet TAKE 1 TABLET TWO TIMES DAILY WITH A MEAL 180 tablet 4  . pantoprazole (PROTONIX) 40 MG tablet TAKE 1 TABLET TWICE A DAY 180 tablet 4  . rosuvastatin (CRESTOR) 10 MG tablet TAKE 1 TABLET DAILY 90 tablet 4   No current facility-administered medications on file prior to visit.   Allergies  Allergen Reactions  . Penicillins Hives   Social History   Socioeconomic History  . Marital status: Widowed    Spouse name: Not on file  . Number of children: Not on file  . Years of education: Not on file  . Highest education level: Not on file  Occupational History  . Not on file  Tobacco Use  . Smoking status: Current Every Day Smoker    Packs/day: 1.00    Years: 40.00    Pack years: 40.00    Types: E-cigarettes, Cigarettes  . Smokeless tobacco: Never Used  Substance and Sexual Activity  . Alcohol use: No    Alcohol/week: 0.0 standard drinks  . Drug use: No  . Sexual activity: Never  Other Topics Concern  . Not on file  Social History Narrative  . Not on file   Social Determinants of Health   Financial Resource Strain:   . Difficulty of Paying Living Expenses: Not on file  Food Insecurity:   . Worried About Charity fundraiser in the Last Year: Not on file  . Ran Out of Food in the Last Year: Not on file  Transportation Needs:   . Lack of Transportation (Medical): Not on file  . Lack of Transportation (Non-Medical): Not on file  Physical Activity:   . Days of Exercise per Week: Not on file  . Minutes of Exercise per Session: Not on file  Stress:   . Feeling of Stress : Not on file  Social Connections:   . Frequency of Communication with Friends and Family: Not on  file  . Frequency of Social Gatherings with Friends and Family: Not on file  . Attends Religious Services: Not on file  .  Active Member of Clubs or Organizations: Not on file  . Attends Archivist Meetings: Not on file  . Marital Status: Not on file  Intimate Partner Violence:   . Fear of Current or Ex-Partner: Not on file  . Emotionally Abused: Not on file  . Physically Abused: Not on file  . Sexually Abused: Not on file   Family History  Problem Relation Age of Onset  . Cancer Father        Stomach  . Crohn's disease Daughter       Review of Systems  All other systems reviewed and are negative.      Objective:   Physical Exam  Constitutional: She is oriented to person, place, and time. She appears well-developed and well-nourished. No distress.  HENT:  Head: Normocephalic and atraumatic.  Right Ear: External ear normal.  Left Ear: External ear normal.  Nose: Nose normal.  Mouth/Throat: Oropharynx is clear and moist. No oropharyngeal exudate.  Eyes: Pupils are equal, round, and reactive to light. Conjunctivae and EOM are normal. Right eye exhibits no discharge. Left eye exhibits no discharge. No scleral icterus.  Neck: No JVD present. No tracheal deviation present. No thyromegaly present.  Cardiovascular: Normal rate, regular rhythm, normal heart sounds and intact distal pulses. Exam reveals no gallop and no friction rub.  No murmur heard. Pulmonary/Chest: Effort normal and breath sounds normal. No stridor. No respiratory distress. She has no wheezes. She has no rales. She exhibits no tenderness.  Abdominal: Soft. Bowel sounds are normal. She exhibits no distension and no mass. There is no abdominal tenderness. There is no rebound and no guarding.  Musculoskeletal:        General: No tenderness or edema. Normal range of motion.     Cervical back: Normal range of motion and neck supple.  Lymphadenopathy:    She has no cervical adenopathy.  Neurological: She is alert and oriented to person, place, and time. She has normal reflexes. No cranial nerve deficit. She exhibits normal muscle  tone. Coordination normal.  Skin: Skin is warm. No rash noted. She is not diaphoretic. No erythema. No pallor.  Psychiatric: She has a normal mood and affect. Her behavior is normal. Judgment and thought content normal.  Vitals reviewed.         Assessment & Plan:  Other iron deficiency anemia - Plan: Fecal Globin By Immunochemistry, Ferritin, Iron, Vitamin B12  Annual physical exam  ASCVD (arteriosclerotic cardiovascular disease)  Hypothyroidism, unspecified type  Coronary artery calcification seen on CAT scan  Smoker  I am very concerned about the drop in her hemoglobin.  I will check her iron level, ferritin level, vitamin B12, and a fecal occult blood test.  Further work-up to be determined based on the results of this.  I advised her to avoid any aspirin or blood thinners.  She is not taking any NSAIDs and she denies any symptoms of gastritis.  Her colonoscopy and mammogram are up-to-date.  She does not require Pap smear.  She received her flu shot today.  Cholesterol looks excellent and I will continue Crestor at his current dose.  The remainder of her lab work is very good.  I am very concerned about her blood pressure.  I recommended that she discontinue Adderall until we find out if her blood pressure is truly that high and also  get it under control.  I have asked the patient to check her blood pressure 2 or 3 times a day for the next 24 to 48 hours.  If consistently elevated and not due to whitecoat syndrome I want her to start hydrochlorothiazide 25 mg a day and follow-up with me next week.

## 2019-10-23 NOTE — Patient Instructions (Signed)

## 2019-10-23 NOTE — Addendum Note (Signed)
Addended by: Shary Decamp B on: 10/23/2019 12:06 PM   Modules accepted: Orders

## 2019-10-24 ENCOUNTER — Other Ambulatory Visit: Payer: Self-pay | Admitting: Family Medicine

## 2019-10-24 ENCOUNTER — Telehealth: Payer: Self-pay | Admitting: Adult Health

## 2019-10-24 ENCOUNTER — Other Ambulatory Visit

## 2019-10-24 DIAGNOSIS — E611 Iron deficiency: Secondary | ICD-10-CM

## 2019-10-24 DIAGNOSIS — D508 Other iron deficiency anemias: Secondary | ICD-10-CM

## 2019-10-24 LAB — FERRITIN: Ferritin: 4 ng/mL — ABNORMAL LOW (ref 16–288)

## 2019-10-24 LAB — VITAMIN B12: Vitamin B-12: 662 pg/mL (ref 200–1100)

## 2019-10-24 LAB — IRON: Iron: 17 ug/dL — ABNORMAL LOW (ref 45–160)

## 2019-10-24 NOTE — Telephone Encounter (Signed)
I talk with patient regarding schedule  

## 2019-10-25 LAB — FECAL GLOBIN BY IMMUNOCHEMISTRY
FECAL GLOBIN RESULT:: DETECTED — AB
MICRO NUMBER:: 10070960
SPECIMEN QUALITY:: ADEQUATE

## 2019-10-27 DIAGNOSIS — E611 Iron deficiency: Secondary | ICD-10-CM

## 2019-10-27 DIAGNOSIS — K921 Melena: Secondary | ICD-10-CM

## 2019-10-27 DIAGNOSIS — D508 Other iron deficiency anemias: Secondary | ICD-10-CM

## 2019-11-07 ENCOUNTER — Other Ambulatory Visit: Payer: Self-pay | Admitting: Family Medicine

## 2019-11-19 ENCOUNTER — Other Ambulatory Visit: Payer: Self-pay | Admitting: Gastroenterology

## 2019-11-19 ENCOUNTER — Encounter: Payer: Self-pay | Admitting: Family Medicine

## 2019-11-19 DIAGNOSIS — F909 Attention-deficit hyperactivity disorder, unspecified type: Secondary | ICD-10-CM

## 2019-11-19 NOTE — Telephone Encounter (Signed)
Pt requesting refill on Adderall      LOV: 10/23/19  LRF:   10/23/19

## 2019-11-21 MED ORDER — AMPHETAMINE-DEXTROAMPHETAMINE 10 MG PO TABS: 10.0000 mg | ORAL_TABLET | Freq: Two times a day (BID) | ORAL | 0 refills | Status: DC

## 2019-11-28 NOTE — Progress Notes (Signed)
Pre procedure call for Endo case 12/04/19 completed.  Pt instructed to call Dr Lorie Apley office to clarify bowel prep instructions since procedure time and location were changed. Also need to clarify date for covid swab with Dr office, assume due Monday. Pt verbalizes understanding to make contact.

## 2019-12-01 ENCOUNTER — Other Ambulatory Visit (HOSPITAL_COMMUNITY)
Admission: RE | Admit: 2019-12-01 | Discharge: 2019-12-01 | Disposition: A | Discharge status: Home / Self Care | Source: Ambulatory Visit | Attending: Gastroenterology | Admitting: Gastroenterology

## 2019-12-01 DIAGNOSIS — Z20822 Contact with and (suspected) exposure to covid-19: Secondary | ICD-10-CM | POA: Diagnosis not present

## 2019-12-01 DIAGNOSIS — Z01812 Encounter for preprocedural laboratory examination: Secondary | ICD-10-CM | POA: Insufficient documentation

## 2019-12-02 LAB — SARS CORONAVIRUS 2 (TAT 6-24 HRS): SARS Coronavirus 2: NEGATIVE

## 2019-12-03 ENCOUNTER — Encounter (HOSPITAL_COMMUNITY): Payer: Self-pay | Admitting: Gastroenterology

## 2019-12-03 NOTE — Anesthesia Preprocedure Evaluation (Addendum)
Anesthesia Evaluation  Patient identified by MRN, date of birth, ID band Patient awake    Reviewed: Allergy & Precautions, NPO status , Patient's Chart, lab work & pertinent test results  Airway Mallampati: I       Dental no notable dental hx. (+) Teeth Intact   Pulmonary Current Smoker and Patient abstained from smoking.,    Pulmonary exam normal breath sounds clear to auscultation       Cardiovascular + Peripheral Vascular Disease  Normal cardiovascular exam Rhythm:Regular Rate:Normal     Neuro/Psych negative neurological ROS     GI/Hepatic Neg liver ROS,   Endo/Other  Hypothyroidism   Renal/GU negative Renal ROS     Musculoskeletal   Abdominal Normal abdominal exam  (+)   Peds  Hematology   Anesthesia Other Findings   Reproductive/Obstetrics                            Anesthesia Physical Anesthesia Plan  ASA: II  Anesthesia Plan: MAC   Post-op Pain Management:    Induction:   PONV Risk Score and Plan: 1 and Propofol infusion  Airway Management Planned: Natural Airway and Mask  Additional Equipment: None  Intra-op Plan:   Post-operative Plan:   Informed Consent: I have reviewed the patients History and Physical, chart, labs and discussed the procedure including the risks, benefits and alternatives for the proposed anesthesia with the patient or authorized representative who has indicated his/her understanding and acceptance.     Dental advisory given  Plan Discussed with: CRNA  Anesthesia Plan Comments:        Anesthesia Quick Evaluation

## 2019-12-04 ENCOUNTER — Other Ambulatory Visit: Payer: Self-pay

## 2019-12-04 ENCOUNTER — Ambulatory Visit (HOSPITAL_COMMUNITY): Admitting: Certified Registered Nurse Anesthetist

## 2019-12-04 ENCOUNTER — Ambulatory Visit (HOSPITAL_COMMUNITY)
Admission: RE | Admit: 2019-12-04 | Discharge: 2019-12-04 | Disposition: A | Discharge status: Home / Self Care | Attending: Gastroenterology | Admitting: Gastroenterology

## 2019-12-04 ENCOUNTER — Encounter (HOSPITAL_COMMUNITY): Payer: Self-pay | Admitting: Gastroenterology

## 2019-12-04 ENCOUNTER — Encounter (HOSPITAL_COMMUNITY)
Admission: RE | Disposition: A | Discharge status: Home / Self Care | Payer: Self-pay | Source: Home / Self Care | Attending: Gastroenterology

## 2019-12-04 DIAGNOSIS — Z79899 Other long term (current) drug therapy: Secondary | ICD-10-CM | POA: Insufficient documentation

## 2019-12-04 DIAGNOSIS — D509 Iron deficiency anemia, unspecified: Secondary | ICD-10-CM | POA: Insufficient documentation

## 2019-12-04 DIAGNOSIS — K2980 Duodenitis without bleeding: Secondary | ICD-10-CM | POA: Insufficient documentation

## 2019-12-04 DIAGNOSIS — K635 Polyp of colon: Secondary | ICD-10-CM | POA: Insufficient documentation

## 2019-12-04 DIAGNOSIS — Z791 Long term (current) use of non-steroidal anti-inflammatories (NSAID): Secondary | ICD-10-CM | POA: Insufficient documentation

## 2019-12-04 DIAGNOSIS — D126 Benign neoplasm of colon, unspecified: Secondary | ICD-10-CM | POA: Insufficient documentation

## 2019-12-04 DIAGNOSIS — F17210 Nicotine dependence, cigarettes, uncomplicated: Secondary | ICD-10-CM | POA: Diagnosis not present

## 2019-12-04 DIAGNOSIS — Z86718 Personal history of other venous thrombosis and embolism: Secondary | ICD-10-CM | POA: Diagnosis not present

## 2019-12-04 DIAGNOSIS — E039 Hypothyroidism, unspecified: Secondary | ICD-10-CM | POA: Diagnosis not present

## 2019-12-04 DIAGNOSIS — K5730 Diverticulosis of large intestine without perforation or abscess without bleeding: Secondary | ICD-10-CM | POA: Insufficient documentation

## 2019-12-04 DIAGNOSIS — F909 Attention-deficit hyperactivity disorder, unspecified type: Secondary | ICD-10-CM | POA: Insufficient documentation

## 2019-12-04 DIAGNOSIS — Z79890 Hormone replacement therapy (postmenopausal): Secondary | ICD-10-CM | POA: Diagnosis not present

## 2019-12-04 DIAGNOSIS — I2510 Atherosclerotic heart disease of native coronary artery without angina pectoris: Secondary | ICD-10-CM | POA: Diagnosis not present

## 2019-12-04 DIAGNOSIS — K3189 Other diseases of stomach and duodenum: Secondary | ICD-10-CM | POA: Diagnosis not present

## 2019-12-04 DIAGNOSIS — K6389 Other specified diseases of intestine: Secondary | ICD-10-CM | POA: Insufficient documentation

## 2019-12-04 DIAGNOSIS — Z853 Personal history of malignant neoplasm of breast: Secondary | ICD-10-CM | POA: Diagnosis not present

## 2019-12-04 DIAGNOSIS — K2950 Unspecified chronic gastritis without bleeding: Secondary | ICD-10-CM | POA: Insufficient documentation

## 2019-12-04 DIAGNOSIS — F17290 Nicotine dependence, other tobacco product, uncomplicated: Secondary | ICD-10-CM | POA: Diagnosis not present

## 2019-12-04 HISTORY — PX: COLONOSCOPY WITH PROPOFOL: SHX5780

## 2019-12-04 HISTORY — PX: BIOPSY: SHX5522

## 2019-12-04 HISTORY — PX: POLYPECTOMY: SHX5525

## 2019-12-04 HISTORY — PX: ESOPHAGOGASTRODUODENOSCOPY (EGD) WITH PROPOFOL: SHX5813

## 2019-12-04 SURGERY — ESOPHAGOGASTRODUODENOSCOPY (EGD) WITH PROPOFOL
Anesthesia: Monitor Anesthesia Care

## 2019-12-04 MED ORDER — PROPOFOL 500 MG/50ML IV EMUL
INTRAVENOUS | Status: AC
  Filled 2019-12-04: qty 50

## 2019-12-04 MED ORDER — SODIUM CHLORIDE 0.9 % IV SOLN: INTRAVENOUS | Status: DC

## 2019-12-04 MED ORDER — PROPOFOL 500 MG/50ML IV EMUL
INTRAVENOUS | Status: DC | PRN
  Administered 2019-12-04: 125 ug/kg/min via INTRAVENOUS

## 2019-12-04 MED ORDER — LACTATED RINGERS IV SOLN: INTRAVENOUS | Status: DC

## 2019-12-04 MED ORDER — PROPOFOL 10 MG/ML IV BOLUS
INTRAVENOUS | Status: DC | PRN
  Administered 2019-12-04: 30 mg via INTRAVENOUS
  Administered 2019-12-04: 20 mg via INTRAVENOUS
  Administered 2019-12-04 (×2): 30 mg via INTRAVENOUS
  Administered 2019-12-04: 40 mg via INTRAVENOUS
  Administered 2019-12-04: 30 mg via INTRAVENOUS
  Administered 2019-12-04: 40 mg via INTRAVENOUS

## 2019-12-04 SURGICAL SUPPLY — 25 items

## 2019-12-04 NOTE — Discharge Instructions (Signed)

## 2019-12-04 NOTE — Op Note (Signed)
[facility] Patient Name: [name] Procedure Date: 12/04/2019 MRN: [id number] Attending MD: Juanita Craver , MD Date of Birth: 1955-10-27 CSN: [id number] Age: 64 Admit Type: Inpatient Procedure:                EGD with cold biopsies. Indications:              Iron deficiency anemia. Providers:                Juanita Craver, MD, Cleda Daub, RN, Lina Sar,                            Technician, Dellie Catholic Referring MD:             Cammie Mcgee. Dennard Schaumann, MD Medicines:                Monitored Anesthesia Care Complications:            No immediate complications. Estimated Blood Loss:     Estimated blood loss was minimal. Procedure:                Pre-Anesthesia Assessment: - Prior to the                            procedure, a history and physical was performed,                            and patient medications and allergies were                            reviewed. The patient's tolerance of previous                            anesthesia was also reviewed. The risks and                            benefits of the procedure and the sedation options                            and risks were discussed with the patient. All                            questions were answered, and informed consent was                            obtained. Prior Anticoagulants: The patient has                            taken no previous anticoagulant or antiplatelet                            agents except for NSAID medication. ASA Grade                            Assessment: II - A patient with mild systemic  disease. After reviewing the risks and benefits,                            the patient was deemed in satisfactory condition to                            undergo the procedure.                           After obtaining informed consent, the endoscope was                            passed under direct vision. Throughout the                            procedure,  the patient's blood pressure, pulse, and                            oxygen saturations were monitored continuously. The                            [id number]-H190 ([id number] Olympus gastroscope was                            introduced through the mouth, and advanced to the                            second part of duodenum. The EGD was accomplished                            without difficulty. The patient tolerated the                            procedure well. Scope In: Scope Out: Findings:      The examined esophagus and GEJ appeared widely patent and normal; the       SCJ was measured aTt 35 cm.      Diffuse moderate inflammation characterized by erythema, friability and       granularity was found in the proximal stomach-biopsies done; the antrum       appeared normal.      The cardia and gastric fundus were normal on retroflexion.      Patchy moderately erythematous mucosa without active bleeding and with       no stigmata of bleeding was found in the duodenal bulb and in the first       portion of the duodenum-biopsies done. Impression:               - Normal appearing, widely patent esophagus and GEJ.                           - Nodular mucosa in proximal stomach-chronic                            gastritis-biopsies done.                           -  Erythematous duodenopathy with edematous folds in                            the duodenal bulb and post-bulbar region-biopsies                            done. Moderate Sedation:      Mac used. Recommendation:           - High fiber diet with augmented water consumption                            daily.                           - Continue present medications; avoid all NSAIDS                            for 4 weeks.                           - Await pathology results.                           - Return to my office in 2 weeks. Procedure Code(s):        --- Professional ---                           [phone], Esophagogastroduodenoscopy,  flexible,                            transoral; with biopsy, single or multiple Diagnosis Code(s):        --- Professional ---                           D50.9, Iron deficiency anemia, unspecified                           K29.50, Unspecified chronic gastritis without                            bleeding                           K31.89, Other diseases of stomach and duodenum CPT copyright 2019 American Medical Association. All rights reserved. The codes documented in this report are preliminary and upon coder review may  be revised to meet current compliance requirements. Juanita Craver, MD Juanita Craver, MD 12/04/2019 8:42:00 AM This report has been signed electronically. Number of Addenda: 0

## 2019-12-04 NOTE — Anesthesia Postprocedure Evaluation (Signed)
Anesthesia Post Note  Patient: [name]  Procedure(s) Performed: ESOPHAGOGASTRODUODENOSCOPY (EGD) WITH PROPOFOL (N/A ) COLONOSCOPY WITH PROPOFOL (N/A ) BIOPSY POLYPECTOMY     Patient location during evaluation: Endoscopy Anesthesia Type: MAC Level of consciousness: awake Pain management: pain level controlled Vital Signs Assessment: post-procedure vital signs reviewed and stable Respiratory status: spontaneous breathing Cardiovascular status: stable Postop Assessment: no apparent nausea or vomiting Anesthetic complications: no    Last Vitals:  Vitals:   12/04/19 0851 12/04/19 0900  BP: (!) 145/97 103/79  Pulse: 74 72  Resp: 15 14  Temp:    SpO2: 98% 97%    Last Pain:  Vitals:   12/04/19 0842  TempSrc: Oral  PainSc:    Pain Goal:                   Huston Foley

## 2019-12-04 NOTE — Op Note (Signed)
[facility] Patient Name: [name] Procedure Date: 12/04/2019 MRN: [id number] Attending MD: Juanita Craver , MD Date of Birth: [birth year] CSN: [id number] Age: 64 Admit Type: Inpatient Procedure:                Colonoscopy with cold biopsies & snare polypectomy                            x 9. Indications:              CRC s, Iron deficiency anemia, Positive stool FIT                            test. Providers:                Juanita Craver, MD, Cleda Daub, RN, Lina Sar,                            Technician, Dellie Catholic Referring MD:             Cammie Mcgee. Dennard Schaumann, MD Medicines:                Monitored Anesthesia Care. Complications:            No immediate complications. Estimated Blood Loss:     Estimated blood loss was minimal. Procedure:                Pre-Anesthesia Assessment: - Prior to the                            procedure, a history and physical was performed,                            and patient medications and allergies were                            reviewed. The patient's tolerance of previous                            anesthesia was also reviewed. The risks and                            benefits of the procedure and the sedation options                            and risks were discussed with the patient. All                            questions were answered, and informed consent was                            obtained. Prior Anticoagulants: The patient has                            taken no previous anticoagulant or antiplatelet  agents except for NSAID medication. ASA Grade                            Assessment: II - A patient with mild systemic                            disease. After reviewing the risks and benefits,                            the patient was deemed in satisfactory condition to                            undergo the procedure. After obtaining informed                            consent, the  colonoscope was passed under direct                            vision. Throughout the procedure, the patient's                            blood pressure, pulse, and oxygen saturations were                            monitored continuously. The CF-HQ190L ([id number])                            Olympus colonoscope was introduced through the anus                            and advanced to the the cecum, identified by                            appendiceal orifice and ileocecal valve. The                            colonoscopy was performed without difficulty. The                            patient tolerated the procedure well. The quality                            of the bowel preparation was adequate. The                            ileocecal valve, the appendiceal orifice and the                            rectum were photographed. The bowel preparation                            used was SUPREP via split dose instruction. Scope In: 7:48:04 AM Scope Out: 8:30:04 AM Scope Withdrawal Time: 0 hours 30 minutes 2 seconds  Total Procedure Duration: 0 hours 42 minutes 0 seconds  Findings:      Diffuse melanosis was found in the entire colon.      A few medium mouthed diverticula were found in the sigmoid colon.      Nine 7-8-9 mm sessile polyps were found in the sigmoid colon; these       polyps were removed with a hot snare x 9-200/20; resection and retrieval       were complete.      A large polypoid lesion was found in the mid-descending colon-this was       biopsied.      A large flat polyp was found in the proximal ascending colon; this       covered more than 3/4 of the fold-biopsies were taken with a cold       forceps for histology.      Small internal hemorrhoids were noted on retroflexion.      4 small sessile polyps removed by cold biopsies from the sigmoid colon Impression:               - Mild melanosis was noted in the entire colon.                           - Few scattered  diverticula in the sigmoid colon.                           - Nine 7-8-9 mm polyps in the sigmoid colon,                            removed with a hot snare x 9; resected and                            retrieved.                           - 4 small sessile polyps in the sigmoid colon                            removed by cold biopsies.                           - Large polypoid lesion in the mid descending                            colon-biopsied.                           - One large flat polyp in the proximal ascending                            colon-biopsied.                           - Small internal hemorrhoids. Moderate Sedation:      MAC used. Recommendation:           - High fiber diet with augmented water consumption  daily.                           - Continue present medications; avoid ALL NSAIDS                            for 4 weeks.                           - Await pathology results.                           - Repeat colonoscopy in 4-6 weeks to plan complete                            removal of the large right colon polyp and follow                            up on the lesion in the descending colon.                           - Return to GI office in 2 weeks.                           - If the patient has any abnormal GI symptoms in                            the interim, she has been advised to call the                            office ASAP for further recommendations. Procedure Code(s):        --- Professional ---                           [phone], Colonoscopy, flexible; with removal of                            tumor(s), polyp(s), or other lesion(s) by snare                            technique Diagnosis Code(s):        --- Professional ---                           D50.9, Iron deficiency anemia, unspecified                           K63.5, Polyp of colon                           Z12.11, Encounter for screening for malignant                             neoplasm of colon  K57.30, Diverticulosis of large intestine without                            perforation or abscess without bleeding                           K63.89, Other specified diseases of intestine                           K92.1, Melena (includes Hematochezia) CPT copyright 2019 American Medical Association. All rights reserved. The codes documented in this report are preliminary and upon coder review may  be revised to meet current compliance requirements. Juanita Craver, MD Juanita Craver, MD 12/04/2019 8:56:38 AM This report has been signed electronically. Number of Addenda: 0

## 2019-12-04 NOTE — Transfer of Care (Signed)
Immediate Anesthesia Transfer of Care Note  Patient: [name]  Procedure(s) Performed: ESOPHAGOGASTRODUODENOSCOPY (EGD) WITH PROPOFOL (N/A ) COLONOSCOPY WITH PROPOFOL (N/A ) BIOPSY POLYPECTOMY  Patient Location: PACU  Anesthesia Type:MAC  Level of Consciousness: awake, alert , oriented and patient cooperative  Airway & Oxygen Therapy: Patient Spontanous Breathing and Patient connected to face mask  Post-op Assessment: Report given to RN and Post -op Vital signs reviewed and stable  Post vital signs: Reviewed and stable  Last Vitals:  Vitals Value Taken Time  BP    Temp    Pulse    Resp    SpO2      Last Pain:  Vitals:   12/04/19 0654  TempSrc: Oral  PainSc: 0-No pain         Complications: No apparent anesthesia complications

## 2019-12-04 NOTE — H&P (Signed)
[name] is an 63 y.o. female.   Chief Complaint: Iron deficiency anemia. HPI: [name] is a 64 year old white female with multiple medical problems listed below who presents to [facility] for EGD and a colonoscopy.  She was found to have a positive fit test.  She denies a family history of colon cancer celiac sprue or IBD.  Her last colonoscopy done in 2017 revealed diverticulosis in sigmoid colon with melanosis coli and a tubular adenoma was removed from the rectum.  See office note for further details.  Past Medical History:  Diagnosis Date  . ADHD (attention deficit hyperactivity disorder) 09/07/2011  . Carotid artery occlusion   . Coronary artery calcification seen on CAT scan   . Coronary artery disease   . 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 DVT (deep venous thrombosis) ([place]) 09/07/2011  . Hypothyroid 09/07/2011  . Smoker 02/17/2013   Past Surgical History:  Procedure Laterality Date  . ABDOMINAL HYSTERECTOMY    . BREAST LUMPECTOMY  March 2013   Left breast  . EYE SURGERY Right Sept. 2014   Lasix - Laser   Family History  Problem Relation Age of Onset  . Cancer Father        Stomach  . Crohn's disease Daughter    Social History:  reports that she has been smoking e-cigarettes and cigarettes. She has a 40.00 pack-year smoking history. She has never used smokeless tobacco. She reports that she does not drink alcohol or use drugs.  Allergies:  Allergies  Allergen Reactions  . Penicillins Hives    Did it involve swelling of the face/tongue/throat, SOB, or low BP? No Did it involve sudden or severe rash/hives, skin peeling, or any reaction on the inside of your mouth or nose? No Did you need to seek medical attention at a hospital or doctor's office? Yes When did it last happen?35 years If all above answers are "NO", may proceed with cephalosporin use.    Medications Prior to Admission  Medication Sig Dispense Refill  .  amphetamine-dextroamphetamine (ADDERALL) 10 MG tablet Take 1 tablet (10 mg total) by mouth 2 (two) times daily. 60 tablet 0  . B Complex-C (B-COMPLEX WITH VITAMIN C) tablet Take 1 tablet by mouth daily.     . calcium carbonate (TUMS - DOSED IN MG ELEMENTAL CALCIUM) 500 MG chewable tablet Chew 1 tablet by mouth daily.    . cholecalciferol (VITAMIN D) 1000 UNITS tablet Take 1,000 Units by mouth daily.    . ferrous sulfate 324 MG TBEC Take 324 mg by mouth daily.    Marland Kitchen levothyroxine (SYNTHROID, LEVOTHROID) 75 MCG tablet TAKE 1 TABLET DAILY BEFORE BREAKFAST (Patient taking differently: Take 75 mcg by mouth daily. ) 90 tablet 4  . Multiple Vitamin (MULTIVITAMIN) tablet Take 1 tablet by mouth daily. Centrum Silver    . naproxen (NAPROSYN) 375 MG tablet TAKE 1 TABLET TWO TIMES DAILY WITH A MEAL (Patient taking differently: Take 375 mg by mouth daily as needed for mild pain or moderate pain. ) 180 tablet 4  . rosuvastatin (CRESTOR) 10 MG tablet TAKE 1 TABLET DAILY (Patient taking differently: Take 10 mg by mouth daily. ) 90 tablet 3  . hydrochlorothiazide (HYDRODIURIL) 25 MG tablet Take 1 tablet (25 mg total) by mouth daily. (Patient not taking: Reported on 11/26/2019) 90 tablet 3  . pantoprazole (PROTONIX) 40 MG tablet TAKE 1 TABLET TWICE A DAY (Patient taking differently: Take 40 mg by mouth daily as needed (reflux). )  180 tablet 4   Blood pressure (!) 155/82, pulse 81, temperature 98.4 F (36.9 C), temperature source Oral, resp. rate (!) 24, height 5\' 6"  (1.676 m), weight 56.7 kg, SpO2 100 %. Physical Exam  Constitutional: She is oriented to person, place, and time. She appears well-developed and well-nourished.  HENT:  Head: Normocephalic and atraumatic.  Eyes: Pupils are equal, round, and reactive to light. Conjunctivae and EOM are normal.  Cardiovascular: Normal rate and regular rhythm.  Respiratory: Effort normal and breath sounds normal.  GI: Soft. Bowel sounds are normal.  Musculoskeletal:      Cervical back: Normal range of motion and neck supple.  Neurological: She is alert and oriented to person, place, and time.  Skin: Skin is warm and dry.  Psychiatric: She has a normal mood and affect. Her behavior is normal. Judgment and thought content normal.    Assessment/Plan Iron deficiency anemia/positive stool FIT test/family history of esophageal/stomach cancer/colorectal cancer screening/personal history of tubular adenoma-proceed with a EGD and a colonoscopy at this time.  Juanita Craver, MD 12/04/2019, 7:22 AM

## 2019-12-05 ENCOUNTER — Other Ambulatory Visit: Payer: Self-pay

## 2019-12-05 LAB — SURGICAL PATHOLOGY

## 2019-12-18 ENCOUNTER — Encounter: Payer: Self-pay | Admitting: Family Medicine

## 2019-12-18 DIAGNOSIS — F909 Attention-deficit hyperactivity disorder, unspecified type: Secondary | ICD-10-CM

## 2019-12-18 MED ORDER — AMPHETAMINE-DEXTROAMPHETAMINE 10 MG PO TABS: 10.0000 mg | ORAL_TABLET | Freq: Two times a day (BID) | ORAL | 0 refills | Status: DC

## 2019-12-18 NOTE — Telephone Encounter (Signed)
Pt requesting refill on Adderall      LOV: 10/23/19  LRF:   11/21/2019

## 2020-01-07 ENCOUNTER — Other Ambulatory Visit: Payer: Self-pay | Admitting: Family Medicine

## 2020-01-19 ENCOUNTER — Other Ambulatory Visit

## 2020-01-19 ENCOUNTER — Encounter: Payer: Self-pay | Admitting: Family Medicine

## 2020-01-19 ENCOUNTER — Other Ambulatory Visit: Payer: Self-pay

## 2020-01-19 DIAGNOSIS — D508 Other iron deficiency anemias: Secondary | ICD-10-CM

## 2020-01-19 DIAGNOSIS — F909 Attention-deficit hyperactivity disorder, unspecified type: Secondary | ICD-10-CM

## 2020-01-19 DIAGNOSIS — E611 Iron deficiency: Secondary | ICD-10-CM

## 2020-01-19 LAB — CBC WITH DIFFERENTIAL/PLATELET
Absolute Monocytes: 586 cells/uL (ref 200–950)
Basophils Absolute: 49 cells/uL (ref 0–200)
Basophils Relative: 0.8 %
Eosinophils Absolute: 250 cells/uL (ref 15–500)
Eosinophils Relative: 4.1 %
HCT: 39.2 % (ref 35.0–45.0)
Hemoglobin: 12.7 g/dL (ref 11.7–15.5)
Lymphs Abs: 1867 cells/uL (ref 850–3900)
MCH: 30.5 pg (ref 27.0–33.0)
MCHC: 32.4 g/dL (ref 32.0–36.0)
MCV: 94.2 fL (ref 80.0–100.0)
MPV: 10.6 fL (ref 7.5–12.5)
Monocytes Relative: 9.6 %
Neutro Abs: 3349 cells/uL (ref 1500–7800)
Neutrophils Relative %: 54.9 %
Platelets: 483 10*3/uL — ABNORMAL HIGH (ref 140–400)
RBC: 4.16 10*6/uL (ref 3.80–5.10)
RDW: 16.5 % — ABNORMAL HIGH (ref 11.0–15.0)
Total Lymphocyte: 30.6 %
WBC: 6.1 10*3/uL (ref 3.8–10.8)

## 2020-01-19 LAB — FERRITIN: Ferritin: 15 ng/mL — ABNORMAL LOW (ref 16–288)

## 2020-01-19 MED ORDER — AMPHETAMINE-DEXTROAMPHETAMINE 10 MG PO TABS: 10.0000 mg | ORAL_TABLET | Freq: Two times a day (BID) | ORAL | 0 refills | Status: DC

## 2020-01-19 NOTE — Telephone Encounter (Signed)
Ok to refill??  Last office visit 10/23/2019.  Last refill 12/18/2019.

## 2020-01-20 ENCOUNTER — Other Ambulatory Visit: Payer: Self-pay | Admitting: Family Medicine

## 2020-01-20 DIAGNOSIS — K921 Melena: Secondary | ICD-10-CM

## 2020-01-20 DIAGNOSIS — E611 Iron deficiency: Secondary | ICD-10-CM

## 2020-01-20 DIAGNOSIS — D508 Other iron deficiency anemias: Secondary | ICD-10-CM

## 2020-02-09 ENCOUNTER — Other Ambulatory Visit: Payer: Self-pay | Admitting: Family Medicine

## 2020-02-21 ENCOUNTER — Encounter: Payer: Self-pay | Admitting: Family Medicine

## 2020-02-21 DIAGNOSIS — F909 Attention-deficit hyperactivity disorder, unspecified type: Secondary | ICD-10-CM

## 2020-02-23 MED ORDER — AMPHETAMINE-DEXTROAMPHETAMINE 10 MG PO TABS: 10.0000 mg | ORAL_TABLET | Freq: Two times a day (BID) | ORAL | 0 refills | Status: DC

## 2020-02-23 NOTE — Telephone Encounter (Signed)
Ok to refill??  Last office visit 10/23/2019.  Last refill 01/19/2020.

## 2020-03-08 ENCOUNTER — Other Ambulatory Visit: Payer: Self-pay | Admitting: Adult Health

## 2020-03-08 DIAGNOSIS — Z1231 Encounter for screening mammogram for malignant neoplasm of breast: Secondary | ICD-10-CM

## 2020-03-24 ENCOUNTER — Encounter: Payer: Self-pay | Admitting: Family Medicine

## 2020-03-24 DIAGNOSIS — F909 Attention-deficit hyperactivity disorder, unspecified type: Secondary | ICD-10-CM

## 2020-03-24 NOTE — Telephone Encounter (Signed)
Ok to refill??  Last office visit 10/23/2019.  Last refill 02/23/2020.

## 2020-03-25 MED ORDER — AMPHETAMINE-DEXTROAMPHETAMINE 10 MG PO TABS: 10.0000 mg | ORAL_TABLET | Freq: Two times a day (BID) | ORAL | 0 refills | Status: DC

## 2020-04-15 ENCOUNTER — Telehealth: Payer: Self-pay | Admitting: Gastroenterology

## 2020-04-15 NOTE — Telephone Encounter (Signed)
Waiting for those records.

## 2020-04-15 NOTE — Telephone Encounter (Signed)
Hey Dr. Tarri Glenn, this past is being referred to [place] for blood in stools, iron def. She has been seen in the past by Dr. Collene Mares and is requesting to transfer her care to you. She does not want to go back to Dr. Collene Mares due to "their lack of interest." I have the records and will send them up to you for review. Please advise on scheduling. Thank you!

## 2020-04-23 ENCOUNTER — Ambulatory Visit
Admission: RE | Admit: 2020-04-23 | Discharge: 2020-04-23 | Disposition: A | Discharge status: Home / Self Care | Source: Ambulatory Visit | Attending: Adult Health | Admitting: Adult Health

## 2020-04-23 ENCOUNTER — Other Ambulatory Visit: Payer: Self-pay

## 2020-04-23 DIAGNOSIS — Z1231 Encounter for screening mammogram for malignant neoplasm of breast: Secondary | ICD-10-CM

## 2020-04-28 ENCOUNTER — Encounter: Payer: Self-pay | Admitting: Family Medicine

## 2020-04-28 DIAGNOSIS — F909 Attention-deficit hyperactivity disorder, unspecified type: Secondary | ICD-10-CM

## 2020-04-28 NOTE — Telephone Encounter (Signed)
Ok to refill??  Last office visit 10/23/2019.  Last refill 03/25/2020.

## 2020-04-29 MED ORDER — AMPHETAMINE-DEXTROAMPHETAMINE 10 MG PO TABS: 10.0000 mg | ORAL_TABLET | Freq: Two times a day (BID) | ORAL | 0 refills | Status: DC

## 2020-05-12 ENCOUNTER — Other Ambulatory Visit: Payer: Self-pay

## 2020-05-12 ENCOUNTER — Ambulatory Visit (INDEPENDENT_AMBULATORY_CARE_PROVIDER_SITE_OTHER): Admitting: Nurse Practitioner

## 2020-05-12 ENCOUNTER — Encounter: Payer: Self-pay | Admitting: Gastroenterology

## 2020-05-12 VITALS — BP 122/76 | HR 83 | Temp 98.1°F | Resp 18 | Wt 121.2 lb

## 2020-05-12 DIAGNOSIS — H00014 Hordeolum externum left upper eyelid: Secondary | ICD-10-CM

## 2020-05-12 DIAGNOSIS — D508 Other iron deficiency anemias: Secondary | ICD-10-CM

## 2020-05-12 LAB — FERRITIN: Ferritin: 17 ng/mL (ref 16–288)

## 2020-05-12 LAB — CBC WITH DIFFERENTIAL/PLATELET
Absolute Monocytes: 586 cells/uL (ref 200–950)
Basophils Absolute: 67 cells/uL (ref 0–200)
Basophils Relative: 0.7 %
Eosinophils Absolute: 211 cells/uL (ref 15–500)
Eosinophils Relative: 2.2 %
HCT: 39.4 % (ref 35.0–45.0)
Hemoglobin: 13.5 g/dL (ref 11.7–15.5)
Lymphs Abs: 2256 cells/uL (ref 850–3900)
MCH: 33.9 pg — ABNORMAL HIGH (ref 27.0–33.0)
MCHC: 34.3 g/dL (ref 32.0–36.0)
MCV: 99 fL (ref 80.0–100.0)
MPV: 10.5 fL (ref 7.5–12.5)
Monocytes Relative: 6.1 %
Neutro Abs: 6480 cells/uL (ref 1500–7800)
Neutrophils Relative %: 67.5 %
Platelets: 435 10*3/uL — ABNORMAL HIGH (ref 140–400)
RBC: 3.98 10*6/uL (ref 3.80–5.10)
RDW: 12.2 % (ref 11.0–15.0)
Total Lymphocyte: 23.5 %
WBC: 9.6 10*3/uL (ref 3.8–10.8)

## 2020-05-12 NOTE — Progress Notes (Signed)
Established Patient Office Visit  Subjective:  Patient ID: [name], female    DOB: [birth year]  Age: 64 y.o. MRN: [id number]  CC:  Chief Complaint  Patient presents with  . Facial Swelling    L eye, started 08/08    HPI [name] is a 64 year old female presenting for left upper eye lid tenderness that started on Sunday night. She has tried no treatment. No contacts with similar sxs. Associated sxs are mild edema with erythema and increase warmth. She has had h/o stye and reports this seems similar. No crusting sclera or visual changes, eye drainage, fever, chills, nasal congestion or drainage.   Past Medical History:  Diagnosis Date  . ADHD (attention deficit hyperactivity disorder) 09/07/2011  . Carotid artery occlusion   . Coronary artery calcification seen on CAT scan   . Coronary artery disease   . 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 DVT (deep venous thrombosis) ([place]) 09/07/2011  . Hypothyroid 09/07/2011  . Personal history of radiation therapy 2012  . Smoker 02/17/2013    Past Surgical History:  Procedure Laterality Date  . ABDOMINAL HYSTERECTOMY    . BIOPSY  12/04/2019   Procedure: BIOPSY;  Surgeon: Juanita Craver, MD;  Location: [facility];  Service: Endoscopy;;  . BREAST LUMPECTOMY  March 2013   Left breast  . COLONOSCOPY WITH PROPOFOL N/A 12/04/2019   Procedure: COLONOSCOPY WITH PROPOFOL;  Surgeon: Juanita Craver, MD;  Location: [facility];  Service: Endoscopy;  Laterality: N/A;  . ESOPHAGOGASTRODUODENOSCOPY (EGD) WITH PROPOFOL N/A 12/04/2019   Procedure: ESOPHAGOGASTRODUODENOSCOPY (EGD) WITH PROPOFOL;  Surgeon: Juanita Craver, MD;  Location: [facility];  Service: Endoscopy;  Laterality: N/A;  . EYE SURGERY Right Sept. 2014   Lasix - Laser  . POLYPECTOMY  12/04/2019   Procedure: POLYPECTOMY;  Surgeon: Juanita Craver, MD;  Location: [facility];  Service: Endoscopy;;    Family History  Problem Relation Age of Onset  . Cancer Father         Stomach  . Crohn's disease Daughter     Social History   Socioeconomic History  . Marital status: Widowed    Spouse name: Not on file  . Number of children: Not on file  . Years of education: Not on file  . Highest education level: Not on file  Occupational History  . Not on file  Tobacco Use  . Smoking status: Current Every Day Smoker    Packs/day: 1.00    Years: 40.00    Pack years: 40.00    Types: E-cigarettes, Cigarettes  . Smokeless tobacco: Never Used  Substance and Sexual Activity  . Alcohol use: No    Alcohol/week: 0.0 standard drinks  . Drug use: No  . Sexual activity: Never  Other Topics Concern  . Not on file  Social History Narrative  . Not on file   Social Determinants of Health   Financial Resource Strain:   . Difficulty of Paying Living Expenses:   Food Insecurity:   . Worried About Charity fundraiser in the Last Year:   . Arboriculturist in the Last Year:   Transportation Needs:   . Film/video editor (Medical):   Marland Kitchen Lack of Transportation (Non-Medical):   Physical Activity:   . Days of Exercise per Week:   . Minutes of Exercise per Session:   Stress:   . Feeling of Stress :   Social Connections:   . Frequency of Communication with Friends  and Family:   . Frequency of Social Gatherings with Friends and Family:   . Attends Religious Services:   . Active Member of Clubs or Organizations:   . Attends Archivist Meetings:   Marland Kitchen Marital Status:   Intimate Partner Violence:   . Fear of Current or Ex-Partner:   . Emotionally Abused:   Marland Kitchen Physically Abused:   . Sexually Abused:     Outpatient Medications Prior to Visit  Medication Sig Dispense Refill  . amphetamine-dextroamphetamine (ADDERALL) 10 MG tablet Take 1 tablet (10 mg total) by mouth 2 (two) times daily. 60 tablet 0  . B Complex-C (B-COMPLEX WITH VITAMIN C) tablet Take 1 tablet by mouth daily.     . calcium carbonate (TUMS - DOSED IN MG ELEMENTAL CALCIUM) 500 MG chewable  tablet Chew 1 tablet by mouth daily.    . cholecalciferol (VITAMIN D) 1000 UNITS tablet Take 1,000 Units by mouth daily.    . ferrous sulfate 324 MG TBEC Take 324 mg by mouth daily.    . Multiple Vitamin (MULTIVITAMIN) tablet Take 1 tablet by mouth daily. Centrum Silver    . naproxen (NAPROSYN) 375 MG tablet TAKE 1 TABLET TWICE A DAY WITH MEALS 180 tablet 3  . pantoprazole (PROTONIX) 40 MG tablet TAKE 1 TABLET TWICE A DAY 180 tablet 3  . rosuvastatin (CRESTOR) 10 MG tablet TAKE 1 TABLET DAILY (Patient taking differently: Take 10 mg by mouth daily. ) 90 tablet 3  . SYNTHROID 75 MCG tablet TAKE 1 TABLET DAILY BEFORE BREAKFAST 90 tablet 3  . hydrochlorothiazide (HYDRODIURIL) 25 MG tablet Take 1 tablet (25 mg total) by mouth daily. (Patient not taking: Reported on 05/12/2020) 90 tablet 3   No facility-administered medications prior to visit.    Allergies  Allergen Reactions  . Penicillins Hives    Did it involve swelling of the face/tongue/throat, SOB, or low BP? No Did it involve sudden or severe rash/hives, skin peeling, or any reaction on the inside of your mouth or nose? No Did you need to seek medical attention at a hospital or doctor's office? Yes When did it last happen?35 years If all above answers are "NO", may proceed with cephalosporin use.    ROS Review of Systems  All other systems reviewed and are negative.     Objective:    Physical Exam Vitals and nursing note reviewed.  Constitutional:      General: She is not in acute distress.    Appearance: Normal appearance. She is not ill-appearing.  HENT:     Head: Normocephalic.  Eyes:     General: Lids are normal. Lids are everted, no foreign bodies appreciated. No allergic shiner, visual field deficit or scleral icterus.       Right eye: No foreign body, discharge or hordeolum.        Left eye: Hordeolum present.No foreign body or discharge.     Extraocular Movements: Extraocular movements intact.     Right eye:  Normal extraocular motion.     Left eye: Normal extraocular motion.     Conjunctiva/sclera: Conjunctivae normal.     Right eye: Right conjunctiva is not injected.     Left eye: Left conjunctiva is not injected. No chemosis, exudate or hemorrhage.    Pupils: Pupils are equal, round, and reactive to light.   Cardiovascular:     Rate and Rhythm: Normal rate.  Pulmonary:     Effort: Pulmonary effort is normal.  Musculoskeletal:     Cervical back:  Normal range of motion and neck supple.  Skin:    General: Skin is warm and dry.  Neurological:     General: No focal deficit present.     Mental Status: She is alert and oriented to person, place, and time.  Psychiatric:        Mood and Affect: Mood normal.        Behavior: Behavior normal.        Thought Content: Thought content normal.        Judgment: Judgment normal.     BP 122/76 (BP Location: Left Arm, Patient Position: Sitting, Cuff Size: Normal)   Pulse 83   Temp 98.1 F (36.7 C) (Temporal)   Resp 18   Wt 121 lb 3.2 oz (55 kg)   SpO2 95%   BMI 19.56 kg/m  Wt Readings from Last 3 Encounters:  05/12/20 121 lb 3.2 oz (55 kg)  12/04/19 125 lb (56.7 kg)  10/23/19 127 lb 4.8 oz (57.7 kg)     Health Maintenance Due  Topic Date Due  . COVID-19 Vaccine (1) Never done  . INFLUENZA VACCINE  05/02/2020    There are no preventive care reminders to display for this patient.  Lab Results  Component Value Date   TSH 1.36 10/13/2019   Lab Results  Component Value Date   WBC 6.1 01/19/2020   HGB 12.7 01/19/2020   HCT 39.2 01/19/2020   MCV 94.2 01/19/2020   PLT 483 (H) 01/19/2020   Lab Results  Component Value Date   NA 138 10/13/2019   K 4.4 10/13/2019   CHLORIDE 104 08/04/2014   CO2 23 10/13/2019   GLUCOSE 93 10/13/2019   BUN 15 10/13/2019   CREATININE 0.66 10/13/2019   BILITOT 0.3 10/13/2019   ALKPHOS 83 08/28/2016   AST 26 10/13/2019   ALT 21 10/13/2019   PROT 6.6 10/13/2019   ALBUMIN 4.4 08/28/2016    CALCIUM 9.6 10/13/2019   ANIONGAP 10 08/04/2014   Lab Results  Component Value Date   CHOL 153 10/13/2019   Lab Results  Component Value Date   HDL 83 10/13/2019   Lab Results  Component Value Date   LDLCALC 55 10/13/2019   Lab Results  Component Value Date   TRIG 73 10/13/2019   Lab Results  Component Value Date   CHOLHDL 1.8 10/13/2019   Lab Results  Component Value Date   HGBA1C  10/16/2007    5.4 (NOTE)   The ADA recommends the following therapeutic goals for glycemic   control related to Hgb A1C measurement:   Goal of Therapy:   < 7.0% Hgb A1C   Action Suggested:  > 8.0% Hgb A1C   Ref:  Diabetes Care, 22, Suppl. 1, 1999      Assessment & Plan:   Problem List Items Addressed This Visit    None    Visit Diagnoses    Other iron deficiency anemia    -  Primary   Relevant Orders   CBC with Differential/Platelet   Ferritin   Hordeolum of left upper eyelid, unspecified hordeolum type          Take over-the-counter and prescription medicines only as told by your health care provider. This includes eye drops or ointments.  If you were prescribed an antibiotic medicine, apply or use it as told by your health care provider. Do not stop using the antibiotic even if your condition improves.  Apply a warm, wet cloth (warm compress) to your eye for  5-10 minutes, 4 times a day.  Clean the affected eyelid as directed by your health care provider.  Do not wear contact lenses or eye makeup until your stye has healed.  Do not try to pop or drain the stye.  Do not rub your eye. Contact a health care provider if:  You have chills or a fever.  Your stye does not go away after several days.  Your stye affects your vision.  Your eyeball becomes swollen, red, or painful. Get help right away if:  You have pain when moving your eye around.   Pt would like to have her iron levels rechecked after taking iron secondary to GI bleed 6 months ago. Will add lab orders With  her PCP.  No orders of the defined types were placed in this encounter.   Follow-up: Return if symptoms worsen or fail to improve.    Annie Main, FNP

## 2020-05-12 NOTE — Patient Instructions (Signed)
Hordeolum of left upper eyelid, unspecified hordeolum type          Take over-the-counter and prescription medicines only as told by your health care provider. This includes eye drops or ointments.  If you were prescribed an antibiotic medicine, apply or use it as told by your health care provider. Do not stop using the antibiotic even if your condition improves.  Apply a warm, wet cloth (warm compress) to your eye for 5-10 minutes, 4 times a day.  Clean the affected eyelid as directed by your health care provider.  Do not wear contact lenses or eye makeup until your stye has healed.  Do not try to pop or drain the stye.  Do not rub your eye. Contact a health care provider if:  You have chills or a fever.  Your stye does not go away after several days.  Your stye affects your vision.  Your eyeball becomes swollen, red, or painful. Get help right away if:  You have pain when moving your eye around.

## 2020-05-14 ENCOUNTER — Encounter: Payer: Self-pay | Admitting: Family Medicine

## 2020-05-17 ENCOUNTER — Encounter: Payer: Self-pay | Admitting: Nurse Practitioner

## 2020-05-18 ENCOUNTER — Other Ambulatory Visit: Payer: Self-pay | Admitting: Nurse Practitioner

## 2020-05-18 DIAGNOSIS — H00014 Hordeolum externum left upper eyelid: Secondary | ICD-10-CM

## 2020-05-18 MED ORDER — TRIPLE ANTIBIOTIC 5-400-5000 EX OINT: TOPICAL_OINTMENT | Freq: Three times a day (TID) | CUTANEOUS | 0 refills | Status: DC

## 2020-05-19 ENCOUNTER — Other Ambulatory Visit: Payer: Self-pay | Admitting: Nurse Practitioner

## 2020-06-02 ENCOUNTER — Encounter: Payer: Self-pay | Admitting: Family Medicine

## 2020-06-02 DIAGNOSIS — F909 Attention-deficit hyperactivity disorder, unspecified type: Secondary | ICD-10-CM

## 2020-06-02 NOTE — Telephone Encounter (Signed)
Ok to refill??  Last office visit 05/12/2020.  Last refill 04/29/2020.

## 2020-06-03 MED ORDER — AMPHETAMINE-DEXTROAMPHETAMINE 10 MG PO TABS: 10.0000 mg | ORAL_TABLET | Freq: Two times a day (BID) | ORAL | 0 refills | Status: DC

## 2020-06-23 ENCOUNTER — Encounter: Payer: Self-pay | Admitting: Family Medicine

## 2020-06-24 ENCOUNTER — Other Ambulatory Visit: Payer: Self-pay | Admitting: Family Medicine

## 2020-06-25 ENCOUNTER — Encounter: Payer: Self-pay | Admitting: Family Medicine

## 2020-06-29 ENCOUNTER — Encounter: Payer: Self-pay | Admitting: Family Medicine

## 2020-06-29 DIAGNOSIS — F909 Attention-deficit hyperactivity disorder, unspecified type: Secondary | ICD-10-CM

## 2020-06-30 NOTE — Telephone Encounter (Signed)
Ok to refill??  Last office visit 05/12/2020.  Last refill 06/03/2020.

## 2020-07-01 MED ORDER — AMPHETAMINE-DEXTROAMPHETAMINE 10 MG PO TABS: 10.0000 mg | ORAL_TABLET | Freq: Two times a day (BID) | ORAL | 0 refills | Status: DC

## 2020-07-09 ENCOUNTER — Encounter: Payer: Self-pay | Admitting: *Deleted

## 2020-07-12 ENCOUNTER — Other Ambulatory Visit (INDEPENDENT_AMBULATORY_CARE_PROVIDER_SITE_OTHER)

## 2020-07-12 ENCOUNTER — Encounter: Payer: Self-pay | Admitting: Gastroenterology

## 2020-07-12 ENCOUNTER — Ambulatory Visit (INDEPENDENT_AMBULATORY_CARE_PROVIDER_SITE_OTHER): Admitting: Gastroenterology

## 2020-07-12 VITALS — BP 126/86 | HR 81 | Ht 65.5 in | Wt 124.0 lb

## 2020-07-12 DIAGNOSIS — K5904 Chronic idiopathic constipation: Secondary | ICD-10-CM

## 2020-07-12 DIAGNOSIS — Z86010 Personal history of colonic polyps: Secondary | ICD-10-CM

## 2020-07-12 LAB — HEMOGLOBIN: Hemoglobin: 12.6 g/dL (ref 12.0–15.0)

## 2020-07-12 LAB — FERRITIN: Ferritin: 13.5 ng/mL (ref 10.0–291.0)

## 2020-07-12 MED ORDER — PLENVU 140 G PO SOLR: 1.0000 | ORAL | 0 refills | Status: DC

## 2020-07-12 NOTE — Patient Instructions (Signed)
Your provider has requested that you go to the basement level for lab work before leaving today. Press "B" on the elevator. The lab is located at the first door on the left as you exit the elevator. __________________________________________________________ Dennis Bast have been scheduled for a colonoscopy. Please follow written instructions given to you at your visit today.  Please pick up your prep supplies at the pharmacy within the next 1-3 days. If you use inhalers (even only as needed), please bring them with you on the day of your procedure. __________________________________________________________ If you are age 23 or older, your body mass index should be between 23-30. Your Body mass index is 20.32 kg/m. If this is out of the aforementioned range listed, please consider follow up with your Primary Care Provider.  If you are age 40 or younger, your body mass index should be between 19-25. Your Body mass index is 20.32 kg/m. If this is out of the aformentioned range listed, please consider follow up with your Primary Care Provider.  ___________________________________________________________ Due to recent changes in healthcare laws, you may see the results of your imaging and laboratory studies on MyChart before your provider has had a chance to review them.  We understand that in some cases there may be results that are confusing or concerning to you. Not all laboratory results come back in the same time frame and the provider may be waiting for multiple results in order to interpret others.  Please give [place] 48 hours in order for your provider to thoroughly review all the results before contacting the office for clarification of your results.

## 2020-07-12 NOTE — Progress Notes (Signed)
Referring Provider: [name], MD Primary Care Physician:  [name], MD  Reason for Consultation: Blood in the stool, iron deficiency anemia   IMPRESSION:  Recent colonoscopy for normocytic anemia and FIT + History of colon polyps    -Multiple sessile serrated adenomas removed 2012 Benson Norway)    -Rectal tubular adenoma 2017 Benson Norway)    -Large right sided tubular adenoma incompletely removed 12/04/2019 Collene Mares)    - repeat colonoscopy recommended by Dr. Collene Mares in 4-6 weeks Chronic idiopathic constipation controlled on herbal tea Sigmoid diverticulosis Melanosis coli Family history of IBD (daughter)  Right colon tubular adenoma incompletely removed during recent colonoscopy. I reviewed the procedure note and images from her colonoscopy with Dr. Collene Mares. The polyp appears to be resectable endoscopically. Will plan colonoscopy for resection after a 2 day prep.  Given her history of anemia, will screen for iron deficiency.   PLAN: Iron, ferritin, hemoglobin Colonoscopy with 2 day prep  Please see the "Patient Instructions" section for addition details about the plan.  HPI: [name] is a 64 y.o. female self referred for blood in the stool. She has a history of ductal breast cancer, CAD, GERD, thyroid dysfunction.  She works as a Chief Financial Officer.   Previously followed by Drs. Benson Norway and Laura for chronic idiopathic constipation.  Colonoscopy with Dr. Benson Norway 06/01/11 revealed multiple sessile serrated adenomas.  Surveillance recommended in 3 years.  Colonoscopy with Dr. Almyra Free for a personal history of colon polyps 10/21/2015 revealed a rectal tubular adenoma, sigmoid diverticulosis, melanosis coli, and a large lipoma in the ascending colon.  Surveillance colonoscopy recommended in 5 years.  Colonoscopy with Dr. Collene Mares 12/04/2019.  Indication noted as iron deficiency anemia and positive FIT stool test January 2021.  A CBC 10/13/2019 showed a hemoglobin of 10.  On 10/10/2018 her hemoglobin  was 12.5.  Labs 10/23/2019 showed an iron of 17 and a ferritin of 7 with a B12 level of 662.  She started an oral iron supplement at that time. Hemoglobin improved to 12/7 01/19/20.   Colonoscopy showed internal hemorrhoids, diffuse melanosis coli, sigmoid diverticulosis, 9 hyperplastic polyps in the sigmoid colon, a large polypoid lesion in the mid ascending colon that was biopsied and was consistent with hyperplastic polyp, and a large flat polyp in the proximal ascending colon that covered more than three quarters of the fold with biopsy showing tubular adenoma.  Biopsies were obtained for histology.  Repeat colonoscopy recommended in 4 to 6 weeks to plan complete removal of the large right colon polyp and follow-up on the lesion in the descending colon.  EGD 12/04/2019 showed a normal esophagus, gastritis, and duodenopathy.  Gastric biopsies were normal.  Duodenal biopsies consistent with peptic duodenitis.  She was instructed to avoid all NSAIDs.  She has a 30-year history of constipation that she treats with an herbal tea, "nature made" herbal tea.  She will have a formed bowel movement daily with the tea. Without the tea she will go two weeks between bowel movements and experience progressive bloating.  There is no associated abdominal pain, sense of incomplete evacuation, or need for manual assistance with defecation.  Daughter with IBD (followed at the IBD clinic at [facility]). Father had Menetriere's stomach cancer and esophageal cancer.  No known family history of colon cancer or polyps. No family history of uterine/endometrial cancer, pancreatic cancer or gastric/stomach cancer.   Past Medical History:  Diagnosis Date  . ADHD (attention deficit hyperactivity disorder) 09/07/2011  . Carotid artery occlusion   .  Carpal tunnel syndrome   . Coronary artery calcification seen on CAT scan   . Coronary artery disease   . 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 Diverticulosis   . DVT  (deep venous thrombosis) ([facility]) 09/07/2011  . Hypothyroid 09/07/2011  . Internal hemorrhoids   . Iron deficiency anemia   . Personal history of radiation therapy 2012  . Smoker 02/17/2013  . Tubular adenoma of colon     Past Surgical History:  Procedure Laterality Date  . ABDOMINAL HYSTERECTOMY    . BIOPSY  12/04/2019   Procedure: BIOPSY;  Surgeon: Juanita Craver, MD;  Location: [facility];  Service: Endoscopy;;  . BREAST LUMPECTOMY  March 2013   Left breast  . COLONOSCOPY WITH PROPOFOL N/A 12/04/2019   Procedure: COLONOSCOPY WITH PROPOFOL;  Surgeon: Juanita Craver, MD;  Location: [facility];  Service: Endoscopy;  Laterality: N/A;  . ESOPHAGOGASTRODUODENOSCOPY (EGD) WITH PROPOFOL N/A 12/04/2019   Procedure: ESOPHAGOGASTRODUODENOSCOPY (EGD) WITH PROPOFOL;  Surgeon: Juanita Craver, MD;  Location: [facility];  Service: Endoscopy;  Laterality: N/A;  . EYE SURGERY Right Sept. 2014   Lasix - Laser  . POLYPECTOMY  12/04/2019   Procedure: POLYPECTOMY;  Surgeon: Juanita Craver, MD;  Location: [facility];  Service: Endoscopy;;    Current Outpatient Medications  Medication Sig Dispense Refill  . amphetamine-dextroamphetamine (ADDERALL) 10 MG tablet Take 1 tablet (10 mg total) by mouth 2 (two) times daily. 60 tablet 0  . B Complex-C (B-COMPLEX WITH VITAMIN C) tablet Take 1 tablet by mouth daily.     . calcium carbonate (TUMS - DOSED IN MG ELEMENTAL CALCIUM) 500 MG chewable tablet Chew 1 tablet by mouth daily.    . cholecalciferol (VITAMIN D) 1000 UNITS tablet Take 1,000 Units by mouth daily.    . ferrous sulfate 324 MG TBEC Take 324 mg by mouth daily.    . hydrochlorothiazide (HYDRODIURIL) 25 MG tablet Take 1 tablet (25 mg total) by mouth daily. (Patient not taking: Reported on 05/12/2020) 90 tablet 3  . Multiple Vitamin (MULTIVITAMIN) tablet Take 1 tablet by mouth daily. Centrum Silver    . naproxen (NAPROSYN) 375 MG tablet TAKE 1 TABLET TWICE A DAY WITH MEALS 180 tablet 3  .  neomycin-bacitracin-polymyxin (NEOSPORIN) [phone] ointment Apply topically 3 (three) times daily. To the affected area of the eyelid 28.3 g 0  . pantoprazole (PROTONIX) 40 MG tablet TAKE 1 TABLET TWICE A DAY 180 tablet 3  . rosuvastatin (CRESTOR) 10 MG tablet TAKE 1 TABLET DAILY (Patient taking differently: Take 10 mg by mouth daily. ) 90 tablet 3  . SYNTHROID 75 MCG tablet TAKE 1 TABLET DAILY BEFORE BREAKFAST 90 tablet 3   No current facility-administered medications for this visit.    Allergies as of 07/12/2020 - Review Complete 05/12/2020  Allergen Reaction Noted  . Penicillins Hives 07/03/2012    Family History  Problem Relation Age of Onset  . Stomach cancer Father   . Esophageal cancer Father   . Crohn's disease Daughter   . Colon cancer Neg Hx   . Pancreatic cancer Neg Hx   . Liver disease Neg Hx     Social History   Socioeconomic History  . Marital status: Widowed    Spouse name: Not on file  . Number of children: Not on file  . Years of education: Not on file  . Highest education level: Not on file  Occupational History  . Not on file  Tobacco Use  . Smoking status: Current Every  Day Smoker    Packs/day: 1.00    Years: 40.00    Pack years: 40.00    Types: E-cigarettes, Cigarettes  . Smokeless tobacco: Never Used  Substance and Sexual Activity  . Alcohol use: Yes    Alcohol/week: 0.0 standard drinks    Comment: socially  . Drug use: No  . Sexual activity: Never  Other Topics Concern  . Not on file  Social History Narrative  . Not on file   Social Determinants of Health   Financial Resource Strain:   . Difficulty of Paying Living Expenses: Not on file  Food Insecurity:   . Worried About Charity fundraiser in the Last Year: Not on file  . Ran Out of Food in the Last Year: Not on file  Transportation Needs:   . Lack of Transportation (Medical): Not on file  . Lack of Transportation (Non-Medical): Not on file  Physical Activity:   . Days of  Exercise per Week: Not on file  . Minutes of Exercise per Session: Not on file  Stress:   . Feeling of Stress : Not on file  Social Connections:   . Frequency of Communication with Friends and Family: Not on file  . Frequency of Social Gatherings with Friends and Family: Not on file  . Attends Religious Services: Not on file  . Active Member of Clubs or Organizations: Not on file  . Attends Archivist Meetings: Not on file  . Marital Status: Not on file  Intimate Partner Violence:   . Fear of Current or Ex-Partner: Not on file  . Emotionally Abused: Not on file  . Physically Abused: Not on file  . Sexually Abused: Not on file    Review of Systems: 12 system ROS is negative except as noted above.   Physical Exam: General:   Alert,  well-nourished, pleasant and cooperative in NAD Head:  Normocephalic and atraumatic. Eyes:  Sclera clear, no icterus.   Conjunctiva pink. Ears:  Normal auditory acuity. Nose:  No deformity, discharge,  or lesions. Mouth:  No deformity or lesions.   Neck:  Supple; no masses or thyromegaly. Lungs:  Clear throughout to auscultation.   No wheezes. Heart:  Regular rate and rhythm; no murmurs. Abdomen:  Soft, nontender, nondistended, normal bowel sounds, no rebound or guarding. No hepatosplenomegaly.   Rectal:  Deferred  Msk:  Symmetrical. No boney deformities LAD: No inguinal or umbilical LAD Extremities:  No clubbing or edema. Neurologic:  Alert and  oriented x4;  grossly nonfocal Skin:  Intact without significant lesions or rashes. Psych:  Alert and cooperative. Normal mood and affect.     Karen Kinnard L. Tarri Glenn, MD, MPH 07/12/2020, 1:36 PM

## 2020-07-13 LAB — IRON: Iron: 139 ug/dL (ref 42–145)

## 2020-07-15 ENCOUNTER — Encounter: Payer: Self-pay | Admitting: Gastroenterology

## 2020-07-26 ENCOUNTER — Telehealth: Payer: Self-pay

## 2020-07-26 ENCOUNTER — Ambulatory Visit (AMBULATORY_SURGERY_CENTER): Admitting: Gastroenterology

## 2020-07-26 ENCOUNTER — Other Ambulatory Visit: Payer: Self-pay

## 2020-07-26 ENCOUNTER — Encounter: Payer: Self-pay | Admitting: Gastroenterology

## 2020-07-26 VITALS — BP 155/80 | HR 60 | Temp 98.0°F | Resp 11 | Ht 65.0 in | Wt 124.0 lb

## 2020-07-26 DIAGNOSIS — K621 Rectal polyp: Secondary | ICD-10-CM

## 2020-07-26 DIAGNOSIS — K635 Polyp of colon: Secondary | ICD-10-CM

## 2020-07-26 DIAGNOSIS — D125 Benign neoplasm of sigmoid colon: Secondary | ICD-10-CM

## 2020-07-26 DIAGNOSIS — D128 Benign neoplasm of rectum: Secondary | ICD-10-CM

## 2020-07-26 DIAGNOSIS — D127 Benign neoplasm of rectosigmoid junction: Secondary | ICD-10-CM | POA: Diagnosis not present

## 2020-07-26 DIAGNOSIS — Z86010 Personal history of colonic polyps: Secondary | ICD-10-CM | POA: Diagnosis present

## 2020-07-26 DIAGNOSIS — D124 Benign neoplasm of descending colon: Secondary | ICD-10-CM

## 2020-07-26 MED ORDER — SODIUM CHLORIDE 0.9 % IV SOLN: 500.0000 mL | Freq: Once | INTRAVENOUS | Status: DC

## 2020-07-26 NOTE — Progress Notes (Signed)
Called to room to assist during endoscopic procedure.  Patient ID and intended procedure confirmed with present staff. Received instructions for my participation in the procedure from the performing physician.  

## 2020-07-26 NOTE — Progress Notes (Signed)
VS by CW  Pt's states no medical or surgical changes since previsit or office visit.  

## 2020-07-26 NOTE — Patient Instructions (Signed)
Information on polyps and diverticulosis given to you today.  Await pathology results.  Resume previous diet and medications.  Eat a high fiber diet and drink 64 ounces of water each day.  Repeat colonoscopy.  YOU HAD AN ENDOSCOPIC PROCEDURE TODAY AT [facility]:   Refer to the procedure report that was given to you for any specific questions about what was found during the examination.  If the procedure report does not answer your questions, please call your gastroenterologist to clarify.  If you requested that your care partner not be given the details of your procedure findings, then the procedure report has been included in a sealed envelope for you to review at your convenience later.  YOU SHOULD EXPECT: Some feelings of bloating in the abdomen. Passage of more gas than usual.  Walking can help get rid of the air that was put into your GI tract during the procedure and reduce the bloating. If you had a lower endoscopy (such as a colonoscopy or flexible sigmoidoscopy) you may notice spotting of blood in your stool or on the toilet paper. If you underwent a bowel prep for your procedure, you may not have a normal bowel movement for a few days.  Please Note:  You might notice some irritation and congestion in your nose or some drainage.  This is from the oxygen used during your procedure.  There is no need for concern and it should clear up in a day or so.  SYMPTOMS TO REPORT IMMEDIATELY:   Following lower endoscopy (colonoscopy or flexible sigmoidoscopy):  Excessive amounts of blood in the stool  Significant tenderness or worsening of abdominal pains  Swelling of the abdomen that is new, acute  Fever of 100F or higher   For urgent or emergent issues, a gastroenterologist can be reached at any hour by calling [phone]. Do not use MyChart messaging for urgent concerns.    DIET:  We do recommend a small meal at first, but then you may proceed to your regular  diet.  Drink plenty of fluids but you should avoid alcoholic beverages for 24 hours.  ACTIVITY:  You should plan to take it easy for the rest of today and you should NOT DRIVE or use heavy machinery until tomorrow (because of the sedation medicines used during the test).    FOLLOW UP: Our staff will call the number listed on your records 48-72 hours following your procedure to check on you and address any questions or concerns that you may have regarding the information given to you following your procedure. If we do not reach you, we will leave a message.  We will attempt to reach you two times.  During this call, we will ask if you have developed any symptoms of COVID 19. If you develop any symptoms (ie: fever, flu-like symptoms, shortness of breath, cough etc.) before then, please call [phone].  If you test positive for Covid 19 in the 2 weeks post procedure, please call and report this information to [place].    If any biopsies were taken you will be contacted by phone or by letter within the next 1-3 weeks.  Please call us at [phone] if you have not heard about the biopsies in 3 weeks.    SIGNATURES/CONFIDENTIALITY: You and/or your care partner have signed paperwork which will be entered into your electronic medical record.  These signatures attest to the fact that that the information above on your After Visit Summary has been reviewed  and is understood.  Full responsibility of the confidentiality of this discharge information lies with you and/or your care-partner.

## 2020-07-26 NOTE — Progress Notes (Signed)
Report given to PACU, vss 

## 2020-07-26 NOTE — Telephone Encounter (Signed)
-----   Message from Thornton Park, MD sent at 07/26/2020  4:29 PM EDT ----- Regarding: RE: Agree with attempt at resection Josalin Carneiro,  This patient was expecting (and hoping) to avoid surgery. She is prepared to discuss with Dr. Rush Landmark and proceed with EMR.  Thank you!  KLB ----- Message ----- From: Irving Copas., MD Sent: 07/26/2020   4:22 PM EDT To: Timothy Lasso, RN, Thornton Park, MD Subject: Agree with attempt at resection                KLB,I would be happy to meet the patient and move forward with talking with her about potential resection if she wants to.  Certainly I will try to get it done as soon as I can though it likely will end up being December based on availability. If you want [place] to go ahead and reach out to her then RN Gerarda Fraction will work on helping make that happen just let us know.Ciani Rutten, once we hear from Dr. Tarri Glenn and her discussion with the patient, if she is okay with moving forward then schedule a 90-minute EMR session and I would go ahead and get her scheduled if she wants to otherwise she wants to wait till after the clinic visit that is up to her.Thanks.JM ----- Message ----- From: Thornton Park, MD Sent: 07/26/2020  11:59 AM EDT To: Irving Copas., MD  Would you please look at this patient's ascending colon polyp and see if you might be able to remove it endoscopically? It involves over 50% of the cirumference, consumes the entire fold, and appears to have at least focal ulceration. Thanks!

## 2020-07-26 NOTE — Op Note (Signed)
[facility] Patient Name: Saje Gallop Procedure Date: 07/26/2020 10:29 AM MRN: [id number] Endoscopist: Thornton Park MD, MD Age: 64 Referring MD:  Date of Birth: 18-Sep-1956 Gender: Female Account #: [id number] Procedure:                Colonoscopy Indications:              Recent colonoscopy for normocytic anemia and FIT +                           History of colon polyps                           -Multiple sessile serrated adenomas removed 2012                            [place])                           -Rectal tubular adenoma 2017 [facility] [place])                           -Large right sided tubular adenoma incompletely                            removed 12/04/2019 Collene Mares)                           - repeat colonoscopy recommended by Dr. Collene Mares in 4-6                            weeks Medicines:                Monitored Anesthesia Care Procedure:                Pre-Anesthesia Assessment:                           - Prior to the procedure, a History and Physical                            was performed, and patient medications and                            allergies were reviewed. The patient's tolerance of                            previous anesthesia was also reviewed. The risks                            and benefits of the procedure and the sedation                            options and risks were discussed with the patient.                            All questions were answered, and informed consent  was obtained. Prior Anticoagulants: The patient has                            taken no previous anticoagulant or antiplatelet                            agents. ASA Grade Assessment: III - A patient with                            severe systemic disease. After reviewing the risks                            and benefits, the patient was deemed in                            satisfactory condition to undergo the procedure.                            After obtaining informed consent, the colonoscope                            was passed under direct vision. Throughout the                            procedure, the patient's blood pressure, pulse, and                            oxygen saturations were monitored continuously. The                            Colonoscope was introduced through the anus and                            advanced to the the cecum, identified by                            appendiceal orifice and ileocecal valve. The                            colonoscopy was technically difficult and complex                            due to a redundant colon, significant looping and a                            tortuous colon. Successful completion of the                            procedure was aided by increasing the dose of                            sedation medication, changing the patient's  position and applying abdominal pressure. The                            patient tolerated the procedure well. The quality                            of the bowel preparation was good. The ileocecal                            valve, appendiceal orifice, and rectum were                            photographed. Scope In: 10:52:51 AM Scope Out: 11:35:48 AM Scope Withdrawal Time: 0 hours 36 minutes 26 seconds  Total Procedure Duration: 0 hours 42 minutes 57 seconds  Findings:                 The perianal and digital rectal examinations were                            normal.                           Multiple small-mouthed diverticula were found in                            the sigmoid colon and distal descending colon.                           Many sessile polyps were found in the rectum. The                            polyps were diminutive in size. Polypectomy was not                            attempted as they have the appearance of                            hyperplastic polyps.                            Three sessile polyps were found in the rectum. The                            polyps were 1 to 2 mm in size. These polyps were                            removed with a cold snare. Resection and retrieval                            were complete. Estimated blood loss was minimal.                           Four sessile polyps were found in the sigmoid  colon. The polyps were 2 to 3 mm in size. These                            polyps were removed with a cold snare. Resection                            and retrieval were complete. Estimated blood loss                            was minimal.                           A 12 mm polyp was found in the descending colon.                            The polyp was multi-lobulated. The polyp was                            removed with a hot snare. Resection and retrieval                            were complete. Estimated blood loss was minimal.                           A 6 mm polyp was found in the descending colon. The                            polyp was sessile. The polyp was removed with a                            cold snare. Resection and retrieval were complete.                            Estimated blood loss was minimal.                           Two large polyps were found in the ascending colon.                            The largest was located 2 folds from the IC valve.                            It involved over 50% of the circumference of the                            colon and extended to include the entire fold (see                            photos). It had a small area of ulceration. Area                            was tattooed with an injection of  2 mL of Spot                            (carbon black). A small polyp was located 1 fold                            from the IC valve.                           A diffuse area of moderately melanotic mucosa was                            found in the entire  colon.                           The exam was otherwise without abnormality on                            direct and retroflexion views. Complications:            No immediate complications. Estimated blood loss:                            Minimal. Estimated Blood Loss:     Estimated blood loss was minimal. Estimated blood                            loss was minimal. Impression:               - Diverticulosis in the sigmoid colon and in the                            distal descending colon.                           - Many diminutive polyps in the rectum. Resection                            not attempted.                           - Three 1 to 2 mm polyps in the rectum, removed                            with a cold snare. Resected and retrieved.                           - Four 2 to 3 mm polyps in the sigmoid colon,                            removed with a cold snare. Resected and retrieved.                           - One 12 mm polyp in the descending colon, removed  with a hot snare. Resected and retrieved.                           - One 6 mm polyp in the descending colon, removed                            with a cold snare. Resected and retrieved.                           - Two polyps in the ascending colon. Tattooed. The                            largest polyp was ulcerated and not removed due to                            the size and extent of polyp involvement.                           - The examination was otherwise normal on direct                            and retroflexion views. Recommendation:           - Patient has a contact number available for                            emergencies. The signs and symptoms of potential                            delayed complications were discussed with the                            patient. Return to normal activities tomorrow.                            Written discharge instructions were provided to  the                            patient.                           - Follow a high fiber diet. Drink at least 64                            ounces of water daily. Add a daily stool bulking                            agent such as psyllium (an exampled would be                            Metamucil).                           - Continue present medications including herbal tea  to control chronic constipation.                           - Await pathology results.                           - Repeat colonoscopy. Will discuss with Dr.                            Rush Landmark to see if she is a candidate for EMR or                            ESD. Otherwise, will need surgical consultation.                           - Emerging evidence supports eating a diet of                            fruits, vegetables, grains, calcium, and yogurt                            while reducing red meat and alcohol may reduce the                            risk of colon cancer.                           - Thank you for allowing me to be involved in your                            colon cancer prevention. Thornton Park MD, MD 07/26/2020 11:53:26 AM This report has been signed electronically.

## 2020-07-27 ENCOUNTER — Other Ambulatory Visit: Payer: Self-pay

## 2020-07-27 DIAGNOSIS — Z86010 Personal history of colonic polyps: Secondary | ICD-10-CM

## 2020-07-27 NOTE — Telephone Encounter (Signed)
Colon EMR  scheduled at [facility] on 09/20/20 at 12 noon with Dr Rush Landmark COVID test on 09/16/20 at 1005 am.

## 2020-07-27 NOTE — Telephone Encounter (Signed)
I spoke with the pt and we discussed all her concerns and she agrees to proceed as planned,.

## 2020-07-27 NOTE — Telephone Encounter (Signed)
Colon scheduled, pt instructed and medications reviewed.  Patient instructions mailed to home.  Patient to call with any questions or concerns.  

## 2020-07-27 NOTE — Telephone Encounter (Signed)
Left message on machine to call back  

## 2020-07-28 ENCOUNTER — Telehealth: Payer: Self-pay | Admitting: Gastroenterology

## 2020-07-28 ENCOUNTER — Telehealth: Payer: Self-pay

## 2020-07-28 NOTE — Telephone Encounter (Signed)
The pt was asking questions regarding COVID testing and location to arrive at [facility].  All questions answered and pt aware to call if she has further concerns

## 2020-07-28 NOTE — Telephone Encounter (Signed)
  Follow up Call-  Call back number 07/26/2020  Post procedure Call Back phone  # [phone]  Permission to leave phone message Yes  Some recent data might be hidden     Patient questions:  Do you have a fever, pain , or abdominal swelling? No. Pain Score  0 *  Have you tolerated food without any problems? Yes.    Have you been able to return to your normal activities? Yes.    Do you have any questions about your discharge instructions: Diet   No. Medications  No. Follow up visit  No.  Do you have questions or concerns about your Care? No.  Actions: * If pain score is 4 or above: No action needed, pain <4. 1. Have you developed a fever since your procedure? no  2.   Have you had an respiratory symptoms (SOB or cough) since your procedure? no  3.   Have you tested positive for COVID 19 since your procedure no  4.   Have you had any family members/close contacts diagnosed with the COVID 19 since your procedure?  no   If yes to any of these questions please route to Joylene John, RN and Joella Prince, RN

## 2020-08-18 ENCOUNTER — Encounter: Payer: Self-pay | Admitting: Family Medicine

## 2020-08-18 DIAGNOSIS — F909 Attention-deficit hyperactivity disorder, unspecified type: Secondary | ICD-10-CM

## 2020-08-18 NOTE — Telephone Encounter (Signed)
Ok to refill??  Last office visit 05/12/2020.  Last refill 07/01/2020.

## 2020-08-19 MED ORDER — AMPHETAMINE-DEXTROAMPHETAMINE 10 MG PO TABS: 10.0000 mg | ORAL_TABLET | Freq: Two times a day (BID) | ORAL | 0 refills | Status: DC

## 2020-08-30 ENCOUNTER — Encounter: Payer: Self-pay | Admitting: Family Medicine

## 2020-09-16 ENCOUNTER — Other Ambulatory Visit (HOSPITAL_COMMUNITY)
Admission: RE | Admit: 2020-09-16 | Discharge: 2020-09-16 | Disposition: A | Discharge status: Home / Self Care | Source: Ambulatory Visit | Attending: Gastroenterology | Admitting: Gastroenterology

## 2020-09-16 DIAGNOSIS — Z20822 Contact with and (suspected) exposure to covid-19: Secondary | ICD-10-CM | POA: Diagnosis not present

## 2020-09-16 DIAGNOSIS — Z01812 Encounter for preprocedural laboratory examination: Secondary | ICD-10-CM | POA: Insufficient documentation

## 2020-09-16 LAB — SARS CORONAVIRUS 2 (TAT 6-24 HRS): SARS Coronavirus 2: NEGATIVE

## 2020-09-17 ENCOUNTER — Encounter (HOSPITAL_COMMUNITY): Payer: Self-pay | Admitting: Gastroenterology

## 2020-09-17 NOTE — Progress Notes (Signed)
Spoke with pt for pre-op call. Pt denies cardiac history and diabetes. States she has hx of HTN but is not on medications.   Covid test done 09/16/20 and it's negative.  Pt states she's been in quarantine since the test was done and understands that she stays in quarantine until she comes to the hospital on Monday.  No EKG noted

## 2020-09-20 ENCOUNTER — Ambulatory Visit (HOSPITAL_COMMUNITY)
Admission: RE | Admit: 2020-09-20 | Discharge: 2020-09-20 | Disposition: A | Discharge status: Home / Self Care | Attending: Gastroenterology | Admitting: Gastroenterology

## 2020-09-20 ENCOUNTER — Ambulatory Visit (HOSPITAL_COMMUNITY): Admitting: Certified Registered Nurse Anesthetist

## 2020-09-20 ENCOUNTER — Encounter (HOSPITAL_COMMUNITY)
Admission: RE | Disposition: A | Discharge status: Home / Self Care | Payer: Self-pay | Source: Home / Self Care | Attending: Gastroenterology

## 2020-09-20 ENCOUNTER — Encounter (HOSPITAL_COMMUNITY): Payer: Self-pay | Admitting: Gastroenterology

## 2020-09-20 ENCOUNTER — Other Ambulatory Visit: Payer: Self-pay

## 2020-09-20 DIAGNOSIS — K5730 Diverticulosis of large intestine without perforation or abscess without bleeding: Secondary | ICD-10-CM | POA: Insufficient documentation

## 2020-09-20 DIAGNOSIS — D123 Benign neoplasm of transverse colon: Secondary | ICD-10-CM | POA: Diagnosis not present

## 2020-09-20 DIAGNOSIS — T8859XA Other complications of anesthesia, initial encounter: Secondary | ICD-10-CM

## 2020-09-20 DIAGNOSIS — Z86010 Personal history of colonic polyps: Secondary | ICD-10-CM | POA: Diagnosis not present

## 2020-09-20 DIAGNOSIS — D122 Benign neoplasm of ascending colon: Secondary | ICD-10-CM | POA: Diagnosis not present

## 2020-09-20 DIAGNOSIS — Z8379 Family history of other diseases of the digestive system: Secondary | ICD-10-CM | POA: Diagnosis not present

## 2020-09-20 DIAGNOSIS — Z880 Allergy status to penicillin: Secondary | ICD-10-CM | POA: Insufficient documentation

## 2020-09-20 DIAGNOSIS — K641 Second degree hemorrhoids: Secondary | ICD-10-CM | POA: Diagnosis not present

## 2020-09-20 DIAGNOSIS — F17210 Nicotine dependence, cigarettes, uncomplicated: Secondary | ICD-10-CM | POA: Diagnosis not present

## 2020-09-20 DIAGNOSIS — K644 Residual hemorrhoidal skin tags: Secondary | ICD-10-CM | POA: Insufficient documentation

## 2020-09-20 DIAGNOSIS — K635 Polyp of colon: Secondary | ICD-10-CM | POA: Diagnosis not present

## 2020-09-20 DIAGNOSIS — Z86718 Personal history of other venous thrombosis and embolism: Secondary | ICD-10-CM | POA: Diagnosis not present

## 2020-09-20 HISTORY — DX: Essential (primary) hypertension: I10

## 2020-09-20 HISTORY — PX: COLONOSCOPY WITH PROPOFOL: SHX5780

## 2020-09-20 HISTORY — DX: Unspecified osteoarthritis, unspecified site: M19.90

## 2020-09-20 HISTORY — PX: POLYPECTOMY: SHX5525

## 2020-09-20 HISTORY — PX: SUBMUCOSAL LIFTING INJECTION: SHX6855

## 2020-09-20 HISTORY — DX: Other complications of anesthesia, initial encounter: T88.59XA

## 2020-09-20 HISTORY — PX: ENDOSCOPIC MUCOSAL RESECTION: SHX6839

## 2020-09-20 HISTORY — DX: Pneumonia, unspecified organism: J18.9

## 2020-09-20 HISTORY — PX: HEMOSTASIS CLIP PLACEMENT: SHX6857

## 2020-09-20 SURGERY — COLONOSCOPY WITH PROPOFOL
Anesthesia: Monitor Anesthesia Care

## 2020-09-20 MED ORDER — PROPOFOL 10 MG/ML IV BOLUS
INTRAVENOUS | Status: DC | PRN
  Administered 2020-09-20: 10 mg via INTRAVENOUS
  Administered 2020-09-20 (×2): 20 mg via INTRAVENOUS
  Administered 2020-09-20 (×2): 10 mg via INTRAVENOUS
  Administered 2020-09-20: 35 mg via INTRAVENOUS
  Administered 2020-09-20 (×3): 20 mg via INTRAVENOUS

## 2020-09-20 MED ORDER — SODIUM CHLORIDE 0.9 % IV SOLN: INTRAVENOUS | Status: DC

## 2020-09-20 MED ORDER — FENTANYL CITRATE (PF) 100 MCG/2ML IJ SOLN
INTRAMUSCULAR | Status: DC | PRN
  Administered 2020-09-20 (×4): 25 ug via INTRAVENOUS

## 2020-09-20 MED ORDER — LIDOCAINE 2% (20 MG/ML) 5 ML SYRINGE
INTRAMUSCULAR | Status: DC | PRN
  Administered 2020-09-20: 40 mg via INTRAVENOUS

## 2020-09-20 MED ORDER — LACTATED RINGERS IV SOLN
INTRAVENOUS | Status: AC | PRN
  Administered 2020-09-20: 1000 mL via INTRAVENOUS

## 2020-09-20 MED ORDER — PROPOFOL 500 MG/50ML IV EMUL
INTRAVENOUS | Status: DC | PRN
  Administered 2020-09-20: 150 ug/kg/min via INTRAVENOUS

## 2020-09-20 MED ORDER — ONDANSETRON HCL 4 MG/2ML IJ SOLN
INTRAMUSCULAR | Status: DC | PRN
  Administered 2020-09-20: 4 mg via INTRAVENOUS

## 2020-09-20 MED ORDER — FENTANYL CITRATE (PF) 100 MCG/2ML IJ SOLN
INTRAMUSCULAR | Status: AC
  Filled 2020-09-20: qty 2

## 2020-09-20 SURGICAL SUPPLY — 22 items

## 2020-09-20 NOTE — Op Note (Addendum)
[facility] Patient Name: [name] Procedure Date : 09/20/2020 MRN: [id number] Attending MD: Justice Britain , MD Date of Birth: [birth year] CSN: [id number] Age: 64 Admit Type: Outpatient Procedure:                Colonoscopy Indications:              Excision of colonic polyp (2 polyps in [facility] were                            noted on last colonoscopy 1 greater than 50% of                            circumference) Providers:                Justice Britain, MD, [name], RN,                            Elspeth Cho Tech., Technician, Reather Laurence, CRNA Referring MD:             Thornton Park MD, MD, Juanita Craver, MD, Cammie Mcgee.                            [place] Medicines:                Monitored Anesthesia Care Complications:            No immediate complications. Estimated Blood Loss:     Estimated blood loss was minimal. Procedure:                Pre-Anesthesia Assessment:                           - Prior to the procedure, a History and Physical                            was performed, and patient medications and                            allergies were reviewed. The patient's tolerance of                            previous anesthesia was also reviewed. The risks                            and benefits of the procedure and the sedation                            options and risks were discussed with the patient.                            All questions were answered, and informed consent  was obtained. Prior Anticoagulants: The patient has                            taken no previous anticoagulant or antiplatelet                            agents. ASA Grade Assessment: II - A patient with                            mild systemic disease. After reviewing the risks                            and benefits, the patient was deemed in                            satisfactory condition to  undergo the procedure.                           After obtaining informed consent, the colonoscope                            was passed under direct vision. Throughout the                            procedure, the patient's blood pressure, pulse, and                            oxygen saturations were monitored continuously. The                            PCF-H190DL ([id number]) Olympus pediatric colonoscope                            was introduced through the anus and advanced to the                            5 cm into the ileum. The colonoscopy was performed                            with moderate difficulty due to significant                            looping, a tortuous colon, the patient's discomfort                            during the procedure and the patient's agitation.                            Successful completion of the procedure was aided by                            increasing the dose of sedation medication,  changing the patient's position, withdrawing and                            reinserting the scope, straightening and shortening                            the scope to obtain bowel loop reduction and using                            scope torsion. The patient tolerated the procedure.                            The quality of the bowel preparation was adequate.                            The terminal ileum, ileocecal valve, appendiceal                            orifice, and rectum were photographed. Scope In: 10:34:57 AM Scope Out: 32:95:18 PM Scope Withdrawal Time: 1 hour 32 minutes 27 seconds  Total Procedure Duration: 1 hour 43 minutes 15 seconds  Findings:      The digital rectal exam findings include hemorrhoids. Pertinent       negatives include no palpable rectal lesions.      The terminal ileum and ileocecal valve appeared normal.      1 medium sized ascending colonic AVM.      A 18 mm polyp was found in the proximal ascending  colon. The polyp was       sessile. Preparations were made for mucosal resection. NBI imaging and       White-light endoscopy was done to demarcate the borders of the lesion.       Orise gel was injected to raise the lesion. Snare mucosal resection was       performed. Resection and retrieval were complete.      A greater than 50 mm polyp was found in the mid ascending colon (this       involved 2 folds). The polyp was semi-sessile. Preparations were made       for mucosal resection. NBI imaging and White light endoscopy was done to       demarcate the borders of the lesion in forward view and in retroflexion.       Orise gel was injected to raise the lesion but this was done serially       throughout the procedure to optimize lift at each section worked on.       Piecemeal mucosal resection using a snare was performed. Resection and       retrieval were felt to be complete. To prevent bleeding after mucosal       resection, eight hemostatic clips were successfully placed (MR       conditional) - but the defect is too large for complete closure. There       was no bleeding at the end of the procedure however.      Two sessile polyps were found in the hepatic flexure (1) and ascending       colon (1). The polyps were 2 to 10 mm in size. These polyps were removed  with a cold snare. Resection and retrieval were complete.      Post-polypectomy scar site noted at 45 cm.      Multiple small-mouthed diverticula were found in the recto-sigmoid colon       and sigmoid colon.      Non-bleeding non-thrombosed external and internal hemorrhoids were found       during retroflexion, during perianal exam and during digital exam. The       hemorrhoids were Grade II (internal hemorrhoids that prolapse but reduce       spontaneously). Impression:               - Hemorrhoids found on digital rectal exam.                           - The examined portion of the ileum was normal.                            - 1 medium sized ascending colonic AVM.                           - One 18 mm polyp in the proximal ascending colon,                            removed with mucosal resection. Resected and                            retrieved.                           - One greater than 50 mm polyp in the mid ascending                            colon, removed with piecemeal mucosal resection.                            Resected and retrieved. Clips (MR conditional) were                            placed though complete defect could not be closed.                           - Two 2 to 10 mm polyps at the hepatic flexure and                            in the ascending colon, removed with a cold snare.                            Resected and retrieved.                           - Post-polypectomy scar site noted at 45 cm.                           - Diverticulosis in the recto-sigmoid colon and in  the sigmoid colon.                           - Non-bleeding non-thrombosed external and internal                            hemorrhoids. Recommendation:           - The patient will be observed post-procedure,                            until all discharge criteria are met.                           - Discharge patient to home.                           - Patient has a contact number available for                            emergencies. The signs and symptoms of potential                            delayed complications were discussed with the                            patient. Return to normal activities tomorrow.                            Written discharge instructions were provided to the                            patient.                           - High fiber diet.                           - Use FiberCon 1-2 tablets PO daily.                           - No aspirin, ibuprofen, naproxen, or other                            non-steroidal anti-inflammatory drugs for 3 weeks                             after polyp removal.                           - Continue present medications.                           - Await pathology results.                           - Monitor for signs/symptoms of bleeding,  perforation, and infection. If issues please call                            our number to get further assistance as needed.                           - Repeat colonoscopy in 6-9 months for surveillance                            after piecemeal resection. Recommend strong                            consideration of General Anesthesia so that we do                            not have patient moving/fidgeting/fighting [place] for                            next procedure.                           - The findings and recommendations were discussed                            with the patient.                           - The findings and recommendations were discussed                            with the patient's family. Procedure Code(s):        --- Professional ---                           [phone], Colonoscopy, flexible; with endoscopic                            mucosal resection                           45385, 50, Colonoscopy, flexible; with removal of                            tumor(s), polyp(s), or other lesion(s) by snare                            technique Diagnosis Code(s):        --- Professional ---                           K64.1, Second degree hemorrhoids                           K63.5, Polyp of colon                           K57.30, Diverticulosis of  large intestine without                            perforation or abscess without bleeding CPT copyright 2019 American Medical Association. All rights reserved. The codes documented in this report are preliminary and upon coder review may  be revised to meet current compliance requirements. Justice Britain, MD 09/20/2020 12:38:23 PM Number of Addenda: 0

## 2020-09-20 NOTE — Anesthesia Postprocedure Evaluation (Signed)
Anesthesia Post Note  Patient: [name]  Procedure(s) Performed: COLONOSCOPY WITH PROPOFOL (N/A ) ENDOSCOPIC MUCOSAL RESECTION (N/A ) SUBMUCOSAL LIFTING INJECTION POLYPECTOMY HEMOSTASIS CLIP PLACEMENT BIOPSY     Patient location during evaluation: Endoscopy Anesthesia Type: MAC Level of consciousness: awake and alert Pain management: pain level controlled Vital Signs Assessment: post-procedure vital signs reviewed and stable Respiratory status: spontaneous breathing, nonlabored ventilation and respiratory function stable Cardiovascular status: stable and blood pressure returned to baseline Postop Assessment: no apparent nausea or vomiting Anesthetic complications: no   No complications documented.  Last Vitals:  Vitals:   09/20/20 1230 09/20/20 1250  BP: (!) 148/64 (!) 162/64  Pulse: 70 69  Resp: 16 17  Temp:    SpO2: 100% 100%    Last Pain:  Vitals:   09/20/20 1250  TempSrc:   PainSc: 0-No pain                 Catalina Gravel

## 2020-09-20 NOTE — Transfer of Care (Signed)
Immediate Anesthesia Transfer of Care Note  Patient: LINDE WILENSKY  Procedure(s) Performed: COLONOSCOPY WITH PROPOFOL (N/A ) ENDOSCOPIC MUCOSAL RESECTION (N/A ) SUBMUCOSAL LIFTING INJECTION POLYPECTOMY HEMOSTASIS CLIP PLACEMENT BIOPSY  Patient Location: Endoscopy Unit  Anesthesia Type:MAC  Level of Consciousness: awake and patient cooperative  Airway & Oxygen Therapy: Patient Spontanous Breathing and Patient connected to face mask oxygen  Post-op Assessment: Report given to RN and Post -op Vital signs reviewed and stable  Post vital signs: Reviewed  Last Vitals:  Vitals Value Taken Time  BP 148/64 09/20/20 1226  Temp    Pulse 70 09/20/20 1231  Resp 12 09/20/20 1231  SpO2 100 % 09/20/20 1231  Vitals shown include unvalidated device data.  Last Pain:  Vitals:   09/20/20 0917  TempSrc: Oral  PainSc: 0-No pain         Complications: No complications documented.

## 2020-09-20 NOTE — Anesthesia Procedure Notes (Signed)
Procedure Name: MAC Date/Time: 09/20/2020 10:23 AM Performed by: Janene Harvey, CRNA Pre-anesthesia Checklist: Patient identified, Emergency Drugs available, Suction available and Patient being monitored Patient Re-evaluated:Patient Re-evaluated prior to induction Oxygen Delivery Method: Nasal cannula Induction Type: IV induction Placement Confirmation: positive ETCO2 Dental Injury: Teeth and Oropharynx as per pre-operative assessment

## 2020-09-20 NOTE — Anesthesia Preprocedure Evaluation (Addendum)
Anesthesia Evaluation  Patient identified by MRN, date of birth, ID band Patient awake    Reviewed: Allergy & Precautions, NPO status , Patient's Chart, lab work & pertinent test results  Airway Mallampati: II  TM Distance: >3 FB Neck ROM: Full    Dental  (+) Teeth Intact   Pulmonary Current Smoker and Patient abstained from smoking.,    Pulmonary exam normal breath sounds clear to auscultation       Cardiovascular hypertension, + CAD, + Peripheral Vascular Disease and + DVT  Normal cardiovascular exam Rhythm:Regular Rate:Normal     Neuro/Psych negative neurological ROS     GI/Hepatic Neg liver ROS, colon polyps   Endo/Other  Hypothyroidism   Renal/GU negative Renal ROS     Musculoskeletal  (+) Arthritis ,   Abdominal   Peds  (+) ADHD Hematology negative hematology ROS (+)   Anesthesia Other Findings Day of surgery medications reviewed with the patient.  Reproductive/Obstetrics                             Anesthesia Physical Anesthesia Plan  ASA: II  Anesthesia Plan: MAC   Post-op Pain Management:    Induction: Intravenous  PONV Risk Score and Plan: 1 and Propofol infusion and Treatment may vary due to age or medical condition  Airway Management Planned: Nasal Cannula and Natural Airway  Additional Equipment:   Intra-op Plan:   Post-operative Plan:   Informed Consent: I have reviewed the patients History and Physical, chart, labs and discussed the procedure including the risks, benefits and alternatives for the proposed anesthesia with the patient or authorized representative who has indicated his/her understanding and acceptance.       Plan Discussed with: CRNA and Anesthesiologist  Anesthesia Plan Comments:         Anesthesia Quick Evaluation

## 2020-09-20 NOTE — H&P (Signed)
GASTROENTEROLOGY PROCEDURE H&P NOTE   Primary Care Physician: [name], MD  HPI: [name] is a 64 y.o. female who presents for Colonoscopy with EMR attempt of 2 ascending colon polyps (s/p 2 prior Colonoscopies this year).  Past Medical History:  Diagnosis Date  . ADHD (attention deficit hyperactivity disorder) 09/07/2011  . Arthritis   . Carotid artery occlusion   . Carpal tunnel syndrome   . Coronary artery calcification seen on CAT scan   . Coronary artery disease   . 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 Diverticulosis   . DVT (deep venous thrombosis) ([place]) 09/07/2011  . Hypertension    on no medications  . Hypothyroid 09/07/2011  . Internal hemorrhoids   . Iron deficiency anemia   . Personal history of radiation therapy 2012  . Pneumonia   . Smoker 02/17/2013  . Tubular adenoma of colon    Past Surgical History:  Procedure Laterality Date  . ABDOMINAL HYSTERECTOMY    . BIOPSY  12/04/2019   Procedure: BIOPSY;  Surgeon: Juanita Craver, MD;  Location: [facility];  Service: Endoscopy;;  . BREAST LUMPECTOMY  March 2013   Left breast  . COLONOSCOPY    . COLONOSCOPY WITH PROPOFOL N/A 12/04/2019   Procedure: COLONOSCOPY WITH PROPOFOL;  Surgeon: Juanita Craver, MD;  Location: [facility];  Service: Endoscopy;  Laterality: N/A;  . ESOPHAGOGASTRODUODENOSCOPY (EGD) WITH PROPOFOL N/A 12/04/2019   Procedure: ESOPHAGOGASTRODUODENOSCOPY (EGD) WITH PROPOFOL;  Surgeon: Juanita Craver, MD;  Location: [facility];  Service: Endoscopy;  Laterality: N/A;  . EYE SURGERY Right Sept. 2014   Lasix - Laser  . POLYPECTOMY  12/04/2019   Procedure: POLYPECTOMY;  Surgeon: Juanita Craver, MD;  Location: [facility];  Service: Endoscopy;;  . UPPER GASTROINTESTINAL ENDOSCOPY     Current Facility-Administered Medications  Medication Dose Route Frequency Provider Last Rate Last Admin  . 0.9 %  sodium chloride infusion   Intravenous Continuous Mansouraty, Telford Nab., MD      .  lactated ringers infusion    Continuous PRN Mansouraty, Telford Nab., MD 20 mL/hr at 09/20/20 0936 Continued from Pre-op at 09/20/20 0936   Allergies  Allergen Reactions  . Penicillins Hives    Did it involve swelling of the face/tongue/throat, SOB, or low BP? No Did it involve sudden or severe rash/hives, skin peeling, or any reaction on the inside of your mouth or nose? No Did you need to seek medical attention at a hospital or doctor's office? Yes When did it last happen?35 years If all above answers are "NO", may proceed with cephalosporin use.   Family History  Problem Relation Age of Onset  . Stomach cancer Father   . Esophageal cancer Father   . Crohn's disease Daughter   . Ulcerative colitis Daughter   . Diabetes Maternal Grandmother   . Colon cancer Neg Hx   . Pancreatic cancer Neg Hx   . Liver disease Neg Hx    Social History   Socioeconomic History  . Marital status: Widowed    Spouse name: Not on file  . Number of children: Not on file  . Years of education: Not on file  . Highest education level: Not on file  Occupational History  . Not on file  Tobacco Use  . Smoking status: Current Every Day Smoker    Packs/day: 1.00    Years: 40.00    Pack years: 40.00    Types: E-cigarettes, Cigarettes  . Smokeless tobacco: Never Used  Vaping Use  . Vaping Use: Never used  Substance and Sexual Activity  . Alcohol use: Yes    Alcohol/week: 0.0 standard drinks    Comment: 4 mixed drinks per week   . Drug use: No  . Sexual activity: Never  Other Topics Concern  . Not on file  Social History Narrative  . Not on file   Social Determinants of Health   Financial Resource Strain: Not on file  Food Insecurity: Not on file  Transportation Needs: Not on file  Physical Activity: Not on file  Stress: Not on file  Social Connections: Not on file  Intimate Partner Violence: Not on file    Physical Exam: Vital signs in last 24 hours: Temp:  [98.8 F (37.1 C)]  98.8 F (37.1 C) (12/20 0917) Pulse Rate:  [74] 74 (12/20 0917) Resp:  [21] 21 (12/20 0917) BP: (169)/(85) 169/85 (12/20 0917) SpO2:  [99 %] 99 % (12/20 0917) Weight:  [55.3 kg] 55.3 kg (12/20 0917)   GEN: NAD EYE: Sclerae anicteric ENT: MMM CV: Non-tachycardic GI: Soft, NT/ND NEURO:  Alert & Oriented x 3  Lab Results: No results for input(s): WBC, HGB, HCT, PLT in the last 72 hours. BMET No results for input(s): NA, K, CL, CO2, GLUCOSE, BUN, CREATININE, CALCIUM in the last 72 hours. LFT No results for input(s): PROT, ALBUMIN, AST, ALT, ALKPHOS, BILITOT, BILIDIR, IBILI in the last 72 hours. PT/INR No results for input(s): LABPROT, INR in the last 72 hours.   Impression / Plan: This is a 64 y.o.female who presents for Colonoscopy with EMR attempt of 2 ascending colon polyps (s/p 2 prior Colonoscopies this year).  Based upon the description and endoscopic pictures I do feel that it is reasonable to pursue an Advanced Polypectomy attempt of the polyp/lesion.  We discussed some of the techniques of advanced polypectomy which include Endoscopic Mucosal Resection, OVESCO Full-Thickness Resection, Endorotor Morcellation, and Tissue Ablation via Fulguration.  We also reviewed images of typical techniques as noted above.  The risks and benefits of endoscopic evaluation were discussed with the patient; these include but are not limited to the risk of perforation, infection, bleeding, missed lesions, lack of diagnosis, severe illness requiring hospitalization, as well as anesthesia and sedation related illnesses.  During attempts at advanced resection, the risks of bleeding and perforation/leak are increased as opposed to diagnostic and screening procedures, and that was discussed with the patient as well.   In addition, I explained that with the possible need for piecemeal resection, subsequent short-interval endoscopic evaluation for follow up and potential retreatment of the lesion/area may be  necessary.  I did offer, a referral to surgery in order for patient to have opportunity to discuss surgical management/intervention prior to finalizing decision for attempt at endoscopic removal, however, the patient deferred on this.  If, after attempt at removal of the polyp/lesion, it is found that the patient has a complication or that an invasive lesion or malignant lesion is found, or that the polyp/lesion continues to recur, the patient is aware and understands that surgery may still be indicated/required.    The risks and benefits of endoscopic evaluation were discussed with the patient; these include but are not limited to the risk of perforation, infection, bleeding, missed lesions, lack of diagnosis, severe illness requiring hospitalization, as well as anesthesia and sedation related illnesses.  The patient is agreeable to proceed.    Justice Britain, MD [facility] Advanced Endoscopy Office # [phone]

## 2020-09-20 NOTE — Discharge Instructions (Signed)
Colonoscopy, Adult, Care After This sheet gives you information about how to care for yourself after your procedure. Your doctor may also give you more specific instructions. If you have problems or questions, call your doctor. What can I expect after the procedure? After the procedure, it is common to have:  A small amount of blood in your poop (stool) for 24 hours.  Some gas.  Mild cramping or bloating in your belly (abdomen). Follow these instructions at home: Eating and drinking   Drink enough fluid to keep your pee (urine) pale yellow.  Follow instructions from your doctor about what you cannot eat or drink.  Return to your normal diet as told by your doctor. Avoid heavy or fried foods that are hard to digest. Activity  Rest as told by your doctor.  Do not sit for a long time without moving. Get up to take short walks every 1-2 hours. This is important. Ask for help if you feel weak or unsteady.  Return to your normal activities as told by your doctor. Ask your doctor what activities are safe for you. To help cramping and bloating:   Try walking around.  Put heat on your belly as told by your doctor. Use the heat source that your doctor recommends, such as a moist heat pack or a heating pad. ? Put a towel between your skin and the heat source. ? Leave the heat on for 20-30 minutes. ? Remove the heat if your skin turns bright red. This is very important if you are unable to feel pain, heat, or cold. You may have a greater risk of getting burned. General instructions  For the first 24 hours after the procedure: ? Do not drive or use machinery. ? Do not sign important documents. ? Do not drink alcohol. ? Do your daily activities more slowly than normal. ? Eat foods that are soft and easy to digest.  Take over-the-counter or prescription medicines only as told by your doctor.  Keep all follow-up visits as told by your doctor. This is important. Contact a doctor  if:  You have blood in your poop 2-3 days after the procedure. Get help right away if:  You have more than a small amount of blood in your poop.  You see large clumps of tissue (blood clots) in your poop.  Your belly is swollen.  You feel like you may vomit (nauseous).  You vomit.  You have a fever.  You have belly pain that gets worse, and medicine does not help your pain. Summary  After the procedure, it is common to have a small amount of blood in your poop. You may also have mild cramping and bloating in your belly.  For the first 24 hours after the procedure, do not drive or use machinery, do not sign important documents, and do not drink alcohol.  Get help right away if you have a lot of blood in your poop, feel like you may vomit, have a fever, or have more belly pain. This information is not intended to replace advice given to you by your health care provider. Make sure you discuss any questions you have with your health care provider. Document Revised: 04/14/2019 Document Reviewed: 04/14/2019 [employer]vier Patient Education  2020 [employer]  

## 2020-09-21 ENCOUNTER — Encounter: Payer: Self-pay | Admitting: Gastroenterology

## 2020-09-21 LAB — SURGICAL PATHOLOGY

## 2020-09-23 ENCOUNTER — Encounter: Payer: Self-pay | Admitting: Family Medicine

## 2020-09-23 DIAGNOSIS — F909 Attention-deficit hyperactivity disorder, unspecified type: Secondary | ICD-10-CM

## 2020-09-27 MED ORDER — AMPHETAMINE-DEXTROAMPHETAMINE 10 MG PO TABS: 10.0000 mg | ORAL_TABLET | Freq: Two times a day (BID) | ORAL | 0 refills | Status: DC

## 2020-09-27 NOTE — Telephone Encounter (Signed)
Ok to refill??  Last office visit 05/12/2020.  Last refill 08/19/2020.

## 2020-10-18 ENCOUNTER — Encounter: Payer: Self-pay | Admitting: Family Medicine

## 2020-10-18 ENCOUNTER — Other Ambulatory Visit: Payer: Self-pay | Admitting: Family Medicine

## 2020-10-25 ENCOUNTER — Encounter: Payer: Self-pay | Admitting: Family Medicine

## 2020-10-25 ENCOUNTER — Other Ambulatory Visit: Payer: Self-pay | Admitting: *Deleted

## 2020-10-25 DIAGNOSIS — E039 Hypothyroidism, unspecified: Secondary | ICD-10-CM

## 2020-10-25 DIAGNOSIS — Z13220 Encounter for screening for lipoid disorders: Secondary | ICD-10-CM

## 2020-10-25 DIAGNOSIS — I2510 Atherosclerotic heart disease of native coronary artery without angina pectoris: Secondary | ICD-10-CM

## 2020-10-25 DIAGNOSIS — D508 Other iron deficiency anemias: Secondary | ICD-10-CM

## 2020-10-26 ENCOUNTER — Other Ambulatory Visit

## 2020-10-26 ENCOUNTER — Other Ambulatory Visit: Payer: Self-pay

## 2020-10-26 DIAGNOSIS — E039 Hypothyroidism, unspecified: Secondary | ICD-10-CM

## 2020-10-26 DIAGNOSIS — I2510 Atherosclerotic heart disease of native coronary artery without angina pectoris: Secondary | ICD-10-CM

## 2020-10-26 DIAGNOSIS — D508 Other iron deficiency anemias: Secondary | ICD-10-CM

## 2020-10-26 DIAGNOSIS — Z13220 Encounter for screening for lipoid disorders: Secondary | ICD-10-CM

## 2020-10-27 ENCOUNTER — Telehealth: Payer: Self-pay | Admitting: Adult Health

## 2020-10-27 NOTE — Telephone Encounter (Signed)
Rescheduled appt per 1/21 email/LC schedule change. Pt confirmed new appt date and time.  

## 2020-10-29 LAB — CARDIO IQ(R) ADVANCED LIPID PANEL
Apolipoprotein B: 65 mg/dL (ref <90)
Cholesterol: 167 mg/dL (ref <200)
HDL: 85 mg/dL (ref >49)
LDL Cholesterol (Calc): 65 mg/dL (calc) (ref <100)
LDL Large: 9334 nmol/L (ref >6729)
LDL Medium: 167 nmol/L (ref <215)
LDL Particle Number: 946 nmol/L (ref <1138)
LDL Peak Size: 220.5 Angstrom — ABNORMAL LOW (ref >222.9)
LDL Small: 156 nmol/L — ABNORMAL HIGH (ref <142)
Lipoprotein (a): 20 nmol/L (ref <75)
Non-HDL Cholesterol (Calc): 82 mg/dL (calc) (ref <130)
Total CHOL/HDL Ratio: 2 calc (ref <3.6)
Triglycerides: 87 mg/dL (ref <150)

## 2020-10-29 LAB — IRON,TIBC AND FERRITIN PANEL
%SAT: 39 % (calc) (ref 16–45)
Ferritin: 14 ng/mL — ABNORMAL LOW (ref 16–288)
Iron: 194 ug/dL — ABNORMAL HIGH (ref 45–160)
TIBC: 494 mcg/dL (calc) — ABNORMAL HIGH (ref 250–450)

## 2020-10-29 LAB — CBC WITH DIFFERENTIAL/PLATELET
Absolute Monocytes: 515 cells/uL (ref 200–950)
Basophils Absolute: 70 cells/uL (ref 0–200)
Basophils Relative: 0.9 %
Eosinophils Absolute: 445 cells/uL (ref 15–500)
Eosinophils Relative: 5.7 %
HCT: 40.2 % (ref 35.0–45.0)
Hemoglobin: 13.6 g/dL (ref 11.7–15.5)
Lymphs Abs: 1810 cells/uL (ref 850–3900)
MCH: 32.5 pg (ref 27.0–33.0)
MCHC: 33.8 g/dL (ref 32.0–36.0)
MCV: 96.2 fL (ref 80.0–100.0)
MPV: 10.4 fL (ref 7.5–12.5)
Monocytes Relative: 6.6 %
Neutro Abs: 4961 cells/uL (ref 1500–7800)
Neutrophils Relative %: 63.6 %
Platelets: 494 10*3/uL — ABNORMAL HIGH (ref 140–400)
RBC: 4.18 10*6/uL (ref 3.80–5.10)
RDW: 12.1 % (ref 11.0–15.0)
Total Lymphocyte: 23.2 %
WBC: 7.8 10*3/uL (ref 3.8–10.8)

## 2020-10-29 LAB — COMPLETE METABOLIC PANEL WITH GFR
AG Ratio: 1.7 (calc) (ref 1.0–2.5)
ALT: 25 U/L (ref 6–29)
AST: 29 U/L (ref 10–35)
Albumin: 4.7 g/dL (ref 3.6–5.1)
Alkaline phosphatase (APISO): 84 U/L (ref 37–153)
BUN: 14 mg/dL (ref 7–25)
CO2: 17 mmol/L — ABNORMAL LOW (ref 20–32)
Calcium: 9.9 mg/dL (ref 8.6–10.4)
Chloride: 109 mmol/L (ref 98–110)
Creat: 0.7 mg/dL (ref 0.50–0.99)
GFR, Est African American: 106 mL/min/{1.73_m2} (ref >=60)
GFR, Est Non African American: 92 mL/min/{1.73_m2} (ref >=60)
Globulin: 2.8 g/dL (calc) (ref 1.9–3.7)
Glucose, Bld: 80 mg/dL (ref 65–99)
Potassium: 5 mmol/L (ref 3.5–5.3)
Sodium: 151 mmol/L — ABNORMAL HIGH (ref 135–146)
Total Bilirubin: 0.4 mg/dL (ref 0.2–1.2)
Total Protein: 7.5 g/dL (ref 6.1–8.1)

## 2020-10-29 LAB — TSH: TSH: 1.31 mIU/L (ref 0.40–4.50)

## 2020-11-01 ENCOUNTER — Encounter: Admitting: Adult Health

## 2020-11-03 ENCOUNTER — Encounter: Payer: Self-pay | Admitting: Family Medicine

## 2020-11-03 DIAGNOSIS — F909 Attention-deficit hyperactivity disorder, unspecified type: Secondary | ICD-10-CM

## 2020-11-03 NOTE — Telephone Encounter (Signed)
Ok to refill??  Last office visit 05/12/2020.  Last refill 09/27/2020.

## 2020-11-04 ENCOUNTER — Encounter: Payer: Self-pay | Admitting: Family Medicine

## 2020-11-04 ENCOUNTER — Ambulatory Visit (INDEPENDENT_AMBULATORY_CARE_PROVIDER_SITE_OTHER): Admitting: Family Medicine

## 2020-11-04 ENCOUNTER — Other Ambulatory Visit: Payer: Self-pay

## 2020-11-04 ENCOUNTER — Ambulatory Visit: Admitting: Family Medicine

## 2020-11-04 VITALS — BP 140/60 | HR 61 | Ht 65.0 in | Wt 125.0 lb

## 2020-11-04 DIAGNOSIS — F17200 Nicotine dependence, unspecified, uncomplicated: Secondary | ICD-10-CM

## 2020-11-04 DIAGNOSIS — E039 Hypothyroidism, unspecified: Secondary | ICD-10-CM

## 2020-11-04 DIAGNOSIS — Z0001 Encounter for general adult medical examination with abnormal findings: Secondary | ICD-10-CM

## 2020-11-04 DIAGNOSIS — Z0000 Encounter for general adult medical examination without abnormal findings: Secondary | ICD-10-CM

## 2020-11-04 DIAGNOSIS — F909 Attention-deficit hyperactivity disorder, unspecified type: Secondary | ICD-10-CM

## 2020-11-04 DIAGNOSIS — D508 Other iron deficiency anemias: Secondary | ICD-10-CM

## 2020-11-04 DIAGNOSIS — I2510 Atherosclerotic heart disease of native coronary artery without angina pectoris: Secondary | ICD-10-CM

## 2020-11-04 MED ORDER — ROSUVASTATIN CALCIUM 20 MG PO TABS: 20.0000 mg | ORAL_TABLET | Freq: Every day | ORAL | 3 refills | Status: DC

## 2020-11-04 MED ORDER — AMPHETAMINE-DEXTROAMPHETAMINE 10 MG PO TABS: 10.0000 mg | ORAL_TABLET | Freq: Two times a day (BID) | ORAL | 0 refills | Status: DC

## 2020-11-04 NOTE — Progress Notes (Signed)
Subjective:    Patient ID: [name], female    DOB: 1955/11/07, 65 y.o.   MRN: [id number]  HPI Patient is a very pleasant 65 year old Caucasian female here today for complete physical exam.  Her last colonoscopy was in December 2021.  They found a 5 cm polyp.  This was completely removed.  Biopsy confirmed a tubular adenoma with no high-grade dysplasia.  She is due for repeat colonoscopy in 3 months.  Her mammogram was last done in July 2021.  She is due again in this July.  She has a hysterectomy and therefore does not require Pap smear.  Her last bone density showed osteopenia in 2020.  She would like to repeat this in 2023.  Her immunizations are up-to-date except for a tetanus shot which she politely declines today.  She has had all 3 doses of her COVID vaccine.  Unfortunately she continues to smoke. Immunization History  Administered Date(s) Administered  . Influenza,inj,Quad PF,6+ Mos 07/20/2014, 09/03/2015, 10/23/2019  . Influenza-Unspecified 10/08/2013, 08/16/2016, 08/07/2017, 08/07/2018  . Pneumococcal Conjugate-13 09/04/2016  . Pneumococcal Polysaccharide-23 09/05/2012, 10/21/2018  . Zoster Recombinat (Shingrix) 06/19/2017, 10/26/2017    Appointment on 10/26/2020  Component Date Value Ref Range Status  . Iron 10/26/2020 194* 45 - 160 mcg/dL Final  . TIBC 10/26/2020 494* 250 - 450 mcg/dL (calc) Final  . %SAT 10/26/2020 39  16 - 45 % (calc) Final  . Ferritin 10/26/2020 14* 16 - 288 ng/mL Final  . Cholesterol 10/26/2020 167  <200 mg/dL Final  . HDL 10/26/2020 85  >49 mg/dL Final  . Triglycerides 10/26/2020 87  <150 mg/dL Final  . LDL Cholesterol (Calc) 10/26/2020 65  <100 mg/dL (calc) Final   Comment: Desirable range <100 mg/dL for primary prevention; <70 mg/dL for patients with CHD or diabetic patients with >= 2 CHD risk factors. LDL-C is now calculated using the Martin-Hopkins calculation, which is a validated novel method providing better accuracy than the  Friedewald equation in the estimation of LDL-C. Cresenciano Genre et al. Annamaria Helling. 0177;939(03): 2061-2068 (http://education.[facility].com/faq/FAQ164) LDL-C is now calculated using the Martin-Hopkins  calculation, which is a validated novel method providing  better accuracy than the Friedewald equation in the  estimation of LDL-C.  Cresenciano Genre et al. Annamaria Helling. 0092;330(07): 2061-2068  (http://education.[facility].com/faq/FAQ164)   . Total CHOL/HDL Ratio 10/26/2020 2.0  <3.6 calc Final  . Non-HDL Cholesterol (Calc) 10/26/2020 82  <130 mg/dL (calc) Final   Comment: For patients with diabetes plus 1 major ASCVD risk factor, treating to a non-HDL-C goal of <100 mg/dL (LDL-C of <70 mg/dL) is considered a therapeutic option. For patients with diabetes plus 1 major ASCVD risk  factor, treating to a non-HDL-C goal of <100 mg/dL  (LDL-C of <70 mg/dL) is considered a therapeutic  option.   . LDL Particle Number 10/26/2020 946  <1,138 nmol/L Final   Comment: Relative Risk: Optimal <1138; Moderate [id number]; High >1409. Reference Range: <1138 nmol/L.   Marland Kitchen LDL Small 10/26/2020 156* <142 nmol/L Final   Comment: Relative Risk: Optimal <142; Moderate 142-219; High >219. Reference Range: <142 nmol/L.   Marland Kitchen LDL Medium 10/26/2020 167  <215 nmol/L Final   Comment: Relative Risk: Optimal <215; Moderate 215-301; High >301. Reference Range: <215 nmol/L.   Marland Kitchen LDL Large 10/26/2020 9,334  >6,729 nmol/L Final   Comment: Relative Risk: Optimal >6729; Moderate [id number]; High <5353. Reference Range: >6729 nmol/L.   Marland Kitchen LDL Pattern 10/26/2020 A  A Pattern Final   Comment: Relative Risk: Optimal Pattern A; High Pattern B.  Reference Range: Pattern A.   . LDL Peak Size 10/26/2020 220.5* >222.9 Angstrom Final   Comment: Relative Risk: Optimal >222.9; Moderate 222.9-217.4; High <217.4. Reference Range: >222.9 Angstrom. Adult cardiovascular event risk category cut points (optimal, moderate, high) are based on an  adult [place] reference population plus two large cohort study populations. Association between lipoprotein subfractions and cardiovascular events is based on Musunuru et al. Christie Nottingham.5852;77:8242. For additional information, please refer to http://education.[facility].com/faq/FAQ134 (This link is being provided for informational/educational purposes only.) . This test is performed by an [employer] method. This test was developed and its performance characteristics determined by [facility]. It has not been cleared or approved by the U.S. FDA. The [facility] is regulated under Clinical Laboratory Improvement Amendments (CLIA) as qualified to perform high-complexity testing. This test is used for clinical purposes.  It should not be regarded as investigati                          onal or for research.   Marland Kitchen Apolipoprotein B 10/26/2020 65  <90 mg/dL Final   Comment: Risk: Optimal <90 mg/dL; Moderate 90-119 mg/dL; High >= 120 mg/dL; Cardiovascular event risk category cut points (optimal, moderate, high) are based on National Lipid Association recommendations- Yates Decamp TA et al. J of Clin Lipid. 2015; 9: 129-169 and Jellinger PS et al. [facility] [place] Pract. 2017;23(Suppl 2):1-87.   . Lipoprotein (a) 10/26/2020 20  <75 nmol/L Final   Comment: Risk: Optimal <75 nmol/L; Moderate 75-125 nmol/L; High >125 nmol/L. Cardiovascular event risk category cut points (optimal, moderate, high) are based on Tsimika S. JACC 3536;14:431-540.   Marland Kitchen TSH 10/26/2020 1.31  0.40 - 4.50 mIU/L Final  . Glucose, Bld 10/26/2020 80  65 - 99 mg/dL Final   Comment: .            Fasting reference interval .   . BUN 10/26/2020 14  7 - 25 mg/dL Final  . Creat 10/26/2020 0.7  0.50 - 0.99 mg/dL Final   Comment: For patients >51 years of age, the reference limit for Creatinine is approximately 13% higher for people identified as African-American. .   . GFR, Est Non African American 10/26/2020 92  > OR  = 60 mL/min/1.93m Final  . GFR, Est African American 10/26/2020 106  > OR = 60 mL/min/1.773mFinal  . BUN/Creatinine Ratio 0108/67/6195OT APPLICABLE  6 - 22 (calc) Final  . Sodium 10/26/2020 151* 135 - 146 mmol/L Final  . Potassium 10/26/2020 5.0  3.5 - 5.3 mmol/L Final  . Chloride 10/26/2020 109  98 - 110 mmol/L Final  . CO2 10/26/2020 17* 20 - 32 mmol/L Final  . Calcium 10/26/2020 9.9  8.6 - 10.4 mg/dL Final  . Total Protein 10/26/2020 7.5  6.1 - 8.1 g/dL Final  . Albumin 10/26/2020 4.7  3.6 - 5.1 g/dL Final  . Globulin 10/26/2020 2.8  1.9 - 3.7 g/dL (calc) Final  . AG Ratio 10/26/2020 1.7  1.0 - 2.5 (calc) Final  . Total Bilirubin 10/26/2020 0.4  0.2 - 1.2 mg/dL Final  . Alkaline phosphatase (APISO) 10/26/2020 84  37 - 153 U/L Final  . AST 10/26/2020 29  10 - 35 U/L Final  . ALT 10/26/2020 25  6 - 29 U/L Final  . WBC 10/26/2020 7.8  3.8 - 10.8 Thousand/uL Final  . RBC 10/26/2020 4.18  3.80 - 5.10 Million/uL Final  . Hemoglobin 10/26/2020 13.6  11.7 - 15.5 g/dL Final  .  HCT 10/26/2020 40.2  35.0 - 45.0 % Final  . MCV 10/26/2020 96.2  80.0 - 100.0 fL Final  . MCH 10/26/2020 32.5  27.0 - 33.0 pg Final  . MCHC 10/26/2020 33.8  32.0 - 36.0 g/dL Final  . RDW 10/26/2020 12.1  11.0 - 15.0 % Final  . Platelets 10/26/2020 494* 140 - 400 Thousand/uL Final  . MPV 10/26/2020 10.4  7.5 - 12.5 fL Final  . Neutro Abs 10/26/2020 4,961  1,500 - 7,800 cells/uL Final  . Lymphs Abs 10/26/2020 1,810  850 - 3,900 cells/uL Final  . Absolute Monocytes 10/26/2020 515  200 - 950 cells/uL Final  . Eosinophils Absolute 10/26/2020 445  15 - 500 cells/uL Final  . Basophils Absolute 10/26/2020 70  0 - 200 cells/uL Final  . Neutrophils Relative % 10/26/2020 63.6  % Final  . Total Lymphocyte 10/26/2020 23.2  % Final  . Monocytes Relative 10/26/2020 6.6  % Final  . Eosinophils Relative 10/26/2020 5.7  % Final  . Basophils Relative 10/26/2020 0.9  % Final   Past Medical History:  Diagnosis Date  . ADHD  (attention deficit hyperactivity disorder) 09/07/2011  . Anesthesia complication 24/40/1027   Will need GETA for future endoscopy procedures. Did not tolerate MAC well.  . Arthritis   . Carotid artery occlusion   . Carpal tunnel syndrome   . Coronary artery calcification seen on CAT scan   . Coronary artery disease   . 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 Diverticulosis   . DVT (deep venous thrombosis) ([place]) 09/07/2011  . Hypertension    on no medications  . Hypothyroid 09/07/2011  . Internal hemorrhoids   . Iron deficiency anemia   . Personal history of radiation therapy 2012  . Pneumonia   . Smoker 02/17/2013  . Tubular adenoma of colon    Past Surgical History:  Procedure Laterality Date  . ABDOMINAL HYSTERECTOMY    . BIOPSY  12/04/2019   Procedure: BIOPSY;  Surgeon: Juanita Craver, MD;  Location: [facility];  Service: Endoscopy;;  . BREAST LUMPECTOMY  March 2013   Left breast  . COLONOSCOPY    . COLONOSCOPY WITH PROPOFOL N/A 12/04/2019   Procedure: COLONOSCOPY WITH PROPOFOL;  Surgeon: Juanita Craver, MD;  Location: [facility];  Service: Endoscopy;  Laterality: N/A;  . COLONOSCOPY WITH PROPOFOL N/A 09/20/2020   Procedure: COLONOSCOPY WITH PROPOFOL;  Surgeon: Rush Landmark Telford Nab., MD;  Location: [place];  Service: Gastroenterology;  Laterality: N/A;  . ENDOSCOPIC MUCOSAL RESECTION N/A 09/20/2020   Procedure: ENDOSCOPIC MUCOSAL RESECTION;  Surgeon: Rush Landmark Telford Nab., MD;  Location: [place];  Service: Gastroenterology;  Laterality: N/A;  . ESOPHAGOGASTRODUODENOSCOPY (EGD) WITH PROPOFOL N/A 12/04/2019   Procedure: ESOPHAGOGASTRODUODENOSCOPY (EGD) WITH PROPOFOL;  Surgeon: Juanita Craver, MD;  Location: [facility];  Service: Endoscopy;  Laterality: N/A;  . EYE SURGERY Right Sept. 2014   Lasix - Laser  . HEMOSTASIS CLIP PLACEMENT  09/20/2020   Procedure: HEMOSTASIS CLIP PLACEMENT;  Surgeon: Irving Copas., MD;  Location: [place];  Service:  Gastroenterology;;  . POLYPECTOMY  12/04/2019   Procedure: POLYPECTOMY;  Surgeon: Juanita Craver, MD;  Location: [facility];  Service: Endoscopy;;  . POLYPECTOMY  09/20/2020   Procedure: POLYPECTOMY;  Surgeon: Irving Copas., MD;  Location: [place];  Service: Gastroenterology;;  . [place] INJECTION  09/20/2020   Procedure: SUBMUCOSAL LIFTING INJECTION;  Surgeon: Irving Copas., MD;  Location: [place];  Service: Gastroenterology;;  . UPPER GASTROINTESTINAL ENDOSCOPY  Current Outpatient Medications on File Prior to Visit  Medication Sig Dispense Refill  . acetaminophen (TYLENOL) 650 MG CR tablet Take 650 mg by mouth every 8 (eight) hours as needed for pain.    Marland Kitchen amphetamine-dextroamphetamine (ADDERALL) 10 MG tablet Take 1 tablet (10 mg total) by mouth 2 (two) times daily. 60 tablet 0  . B Complex-C (B-COMPLEX WITH VITAMIN C) tablet Take 1 tablet by mouth daily.    . calcium carbonate (TUMS - DOSED IN MG ELEMENTAL CALCIUM) 500 MG chewable tablet Chew 1 tablet by mouth 3 (three) times daily as needed for indigestion or heartburn.    . cholecalciferol (VITAMIN D) 1000 UNITS tablet Take 1,000 Units by mouth daily.    . ferrous sulfate 325 (65 FE) MG tablet Take 325 mg by mouth daily with breakfast.    . Multiple Vitamin (MULTIVITAMIN WITH MINERALS) TABS tablet Take 1 tablet by mouth daily. Centrum Silver    . naproxen (NAPROSYN) 375 MG tablet TAKE 1 TABLET TWICE A DAY WITH MEALS (Patient taking differently: Take 375 mg by mouth 2 (two) times daily as needed (pain.).) 180 tablet 3  . rosuvastatin (CRESTOR) 10 MG tablet TAKE 1 TABLET DAILY 90 tablet 3  . SYNTHROID 75 MCG tablet TAKE 1 TABLET DAILY BEFORE BREAKFAST (Patient taking differently: Take 75 mcg by mouth daily before breakfast.) 90 tablet 3   No current facility-administered medications on file prior to visit.   Allergies  Allergen Reactions  . Penicillins Hives    Did it involve swelling of the  face/tongue/throat, SOB, or low BP? No Did it involve sudden or severe rash/hives, skin peeling, or any reaction on the inside of your mouth or nose? No Did you need to seek medical attention at a hospital or doctor's office? Yes When did it last happen?35 years If all above answers are "NO", may proceed with cephalosporin use.   Social History   Socioeconomic History  . Marital status: Widowed    Spouse name: Not on file  . Number of children: Not on file  . Years of education: Not on file  . Highest education level: Not on file  Occupational History  . Not on file  Tobacco Use  . Smoking status: Current Every Day Smoker    Packs/day: 1.00    Years: 40.00    Pack years: 40.00    Types: E-cigarettes, Cigarettes  . Smokeless tobacco: Never Used  Vaping Use  . Vaping Use: Never used  Substance and Sexual Activity  . Alcohol use: Yes    Alcohol/week: 0.0 standard drinks    Comment: 4 mixed drinks per week   . Drug use: No  . Sexual activity: Never  Other Topics Concern  . Not on file  Social History Narrative  . Not on file   Social Determinants of Health   Financial Resource Strain: Not on file  Food Insecurity: Not on file  Transportation Needs: Not on file  Physical Activity: Not on file  Stress: Not on file  Social Connections: Not on file  Intimate Partner Violence: Not on file   Family History  Problem Relation Age of Onset  . Stomach cancer Father   . Esophageal cancer Father   . Crohn's disease Daughter   . Ulcerative colitis Daughter   . Diabetes Maternal Grandmother   . Colon cancer Neg Hx   . Pancreatic cancer Neg Hx   . Liver disease Neg Hx       Review of Systems  All  other systems reviewed and are negative.      Objective:   Physical Exam Vitals reviewed.  Constitutional:      General: She is not in acute distress.    Appearance: She is well-developed. She is not diaphoretic.  HENT:     Head: Normocephalic and atraumatic.      Right Ear: External ear normal.     Left Ear: External ear normal.     Nose: Nose normal.     Mouth/Throat:     Pharynx: No oropharyngeal exudate.  Eyes:     General: No scleral icterus.       Right eye: No discharge.        Left eye: No discharge.     Conjunctiva/sclera: Conjunctivae normal.     Pupils: Pupils are equal, round, and reactive to light.  Neck:     Thyroid: No thyromegaly.     Vascular: No JVD.     Trachea: No tracheal deviation.  Cardiovascular:     Rate and Rhythm: Normal rate and regular rhythm.     Heart sounds: Normal heart sounds. No murmur heard. No friction rub. No gallop.   Pulmonary:     Effort: Pulmonary effort is normal. No respiratory distress.     Breath sounds: Normal breath sounds. No stridor. No wheezing or rales.  Chest:     Chest wall: No tenderness.  Abdominal:     General: Bowel sounds are normal. There is no distension.     Palpations: Abdomen is soft. There is no mass.     Tenderness: There is no abdominal tenderness. There is no guarding or rebound.  Musculoskeletal:        General: No tenderness. Normal range of motion.     Cervical back: Normal range of motion and neck supple.  Lymphadenopathy:     Cervical: No cervical adenopathy.  Skin:    General: Skin is warm.     Coloration: Skin is not pale.     Findings: No erythema or rash.  Neurological:     Mental Status: She is alert and oriented to person, place, and time.     Cranial Nerves: No cranial nerve deficit.     Motor: No abnormal muscle tone.     Coordination: Coordination normal.     Deep Tendon Reflexes: Reflexes are normal and symmetric.  Psychiatric:        Behavior: Behavior normal.        Thought Content: Thought content normal.        Judgment: Judgment normal.           Assessment & Plan:  Annual physical exam  Attention deficit hyperactivity disorder (ADHD), unspecified ADHD type  Other iron deficiency anemia  Hypothyroidism, unspecified  type  Smoker  ASCVD (arteriosclerotic cardiovascular disease)  Strongly recommend smoking cessation.  Given her known ASCVD I recommended an LDL cholesterol less than 70 and LDL particle number less than 700.  For this reason I would like to increase her Crestor to 20 mg a day.  TSH was within normal limits.  Therefore levothyroxine is at the appropriate dose.  Hemoglobin is excellent and iron levels are back to normal so she can stop her iron supplement.  Colonoscopy is due again in 3 months.  Mammogram is due in July.  Pap smear is not required.  Bone density test will be performed next year.  Recommended 1200 mg a day of calcium and 1000 units a day of vitamin D.  Blood  pressure is borderline today so of asked the patient start checking her blood pressure daily at home and notify us of the values.

## 2020-11-04 NOTE — Progress Notes (Signed)
CLINIC:  Survivorship   REASON FOR VISIT:  Routine follow-up for history of breast cancer.   BRIEF ONCOLOGIC HISTORY:  Oncology History  Breast cancer of upper-outer quadrant of left female breast ([place])  01/30/2007 Surgery   Left breast lumpectomy: High-grade DCIS with focal necrosis 1.7 cm ER 54%, PR 74%   03/26/2007 - 05/13/2007 Radiation Therapy   Adjuvant radiation therapy   06/05/2007 - 06/05/2012 Anti-estrogen oral therapy   Arimidex 1 mg daily      INTERVAL HISTORY:  Ms. Neria presents to the [facility] today for routine follow-up for her history of breast cancer.    Since her last visit she underwent bilateral breast 3d mammogram on 04/23/2020 that showed no evidence of malignancy and bresat density category B. Her most recent bone density on 04/18/2019 showed osteopenia with a t score of -1.7.   Rie continues to smoke about a pack a day for the past 40 years, and is due for her CT lung cancer screening.  She last underwent this in 11/2018.  Last year when she had her physical, she was found to have some iron deficiency.  She underwent upper endoscopy and colonoscopy, and she underwent several procedures.  She notes she is due for another colonoscopy soon.  She notes with her physical yesterday her iron levels had improved. She was recommended to undergo repeat colonoscopy in 6-9 months from her 09/2020 colonoscopy.    She notes she hasn't seen gynecology in a while, and had a partial hysterectomy.  I recommended she f/u for a pap smear.  She notes that Dr. Dennard Schaumann checked her skin yesterday.    Itxel remains active, she works at home and says that she runs many errands, but does no specific exercises due to bulging discs in her back.   REVIEW OF SYSTEMS:  Review of Systems  Constitutional: Negative for appetite change, chills, fatigue, fever and unexpected weight change.  HENT:   Negative for hearing loss, lump/mass, sore throat and trouble swallowing.   Eyes:  Negative for eye problems and icterus.  Respiratory: Negative for chest tightness, cough and shortness of breath.   Cardiovascular: Negative for chest pain, leg swelling and palpitations.  Gastrointestinal: Negative for abdominal distention, abdominal pain, constipation, diarrhea, nausea and vomiting.  Endocrine: Negative for hot flashes.  Genitourinary: Negative for difficulty urinating.   Musculoskeletal: Negative for arthralgias.  Skin: Negative for itching and rash.  Neurological: Negative for dizziness.  Hematological: Negative for adenopathy. Does not bruise/bleed easily.  Psychiatric/Behavioral: Negative for depression. The patient is not nervous/anxious.   Breast: Denies any new nodularity, masses, tenderness, nipple changes, or nipple discharge.       PAST MEDICAL/SURGICAL HISTORY:  Past Medical History:  Diagnosis Date  . ADHD (attention deficit hyperactivity disorder) 09/07/2011  . Anesthesia complication 35/46/5681   Will need GETA for future endoscopy procedures. Did not tolerate MAC well.  . Arthritis   . Carotid artery occlusion   . Carpal tunnel syndrome   . Coronary artery calcification seen on CAT scan   . Coronary artery disease   . 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 Diverticulosis   . DVT (deep venous thrombosis) ([place]) 09/07/2011  . Hypertension    on no medications  . Hypothyroid 09/07/2011  . Internal hemorrhoids   . Iron deficiency anemia   . Personal history of radiation therapy 2012  . Pneumonia   . Smoker 02/17/2013  . Tubular adenoma of colon    Past Surgical  History:  Procedure Laterality Date  . ABDOMINAL HYSTERECTOMY    . BIOPSY  12/04/2019   Procedure: BIOPSY;  Surgeon: Juanita Craver, MD;  Location: [facility];  Service: Endoscopy;;  . BREAST LUMPECTOMY  March 2013   Left breast  . COLONOSCOPY    . COLONOSCOPY WITH PROPOFOL N/A 12/04/2019   Procedure: COLONOSCOPY WITH PROPOFOL;  Surgeon: Juanita Craver, MD;  Location: [facility];  Service: Endoscopy;  Laterality: N/A;  . COLONOSCOPY WITH PROPOFOL N/A 09/20/2020   Procedure: COLONOSCOPY WITH PROPOFOL;  Surgeon: Rush Landmark Telford Nab., MD;  Location: [facility];  Service: Gastroenterology;  Laterality: N/A;  . ENDOSCOPIC MUCOSAL RESECTION N/A 09/20/2020   Procedure: ENDOSCOPIC MUCOSAL RESECTION;  Surgeon: Rush Landmark Telford Nab., MD;  Location: [facility];  Service: Gastroenterology;  Laterality: N/A;  . ESOPHAGOGASTRODUODENOSCOPY (EGD) WITH PROPOFOL N/A 12/04/2019   Procedure: ESOPHAGOGASTRODUODENOSCOPY (EGD) WITH PROPOFOL;  Surgeon: Juanita Craver, MD;  Location: [facility];  Service: Endoscopy;  Laterality: N/A;  . EYE SURGERY Right Sept. 2014   Lasix - Laser  . HEMOSTASIS CLIP PLACEMENT  09/20/2020   Procedure: HEMOSTASIS CLIP PLACEMENT;  Surgeon: Irving Copas., MD;  Location: [facility];  Service: Gastroenterology;;  . POLYPECTOMY  12/04/2019   Procedure: POLYPECTOMY;  Surgeon: Juanita Craver, MD;  Location: [facility];  Service: Endoscopy;;  . POLYPECTOMY  09/20/2020   Procedure: POLYPECTOMY;  Surgeon: Irving Copas., MD;  Location: [facility];  Service: Gastroenterology;;  . [place] INJECTION  09/20/2020   Procedure: SUBMUCOSAL LIFTING INJECTION;  Surgeon: Irving Copas., MD;  Location[facility] END[facility];  Service: Gastroenterology;;  . UPPER GASTROINTESTINAL ENDOSCOPY       ALLERGIES:  Allergies  Allergen Reactions  . Penicillins Hives    Did it involve swelling of the face/tongue/throat, SOB, or low BP? No Did it involve sudden or severe rash/hives, skin peeling, or any reaction on the inside of your mouth or nose? No Did you need to seek medical attention at a hospital or doctor's office? Yes When did it last happen?35 years If all above answers are "NO", may proceed with cephalosporin use.     CURRENT MEDICATIONS:  Outpatient Encounter Medications as of 11/05/2020  Medication Sig  .  acetaminophen (TYLENOL) 650 MG CR tablet Take 650 mg by mouth every 8 (eight) hours as needed for pain.  Marland Kitchen amphetamine-dextroamphetamine (ADDERALL) 10 MG tablet Take 1 tablet (10 mg total) by mouth 2 (two) times daily.  . B Complex-C (B-COMPLEX WITH VITAMIN C) tablet Take 1 tablet by mouth daily.  . calcium carbonate (TUMS - DOSED IN MG ELEMENTAL CALCIUM) 500 MG chewable tablet Chew 1 tablet by mouth 3 (three) times daily as needed for indigestion or heartburn.  . cholecalciferol (VITAMIN D) 1000 UNITS tablet Take 1,000 Units by mouth daily.  . ferrous sulfate 325 (65 FE) MG tablet Take 325 mg by mouth daily with breakfast.  . Multiple Vitamin (MULTIVITAMIN WITH MINERALS) TABS tablet Take 1 tablet by mouth daily. Centrum Silver  . naproxen (NAPROSYN) 375 MG tablet TAKE 1 TABLET TWICE A DAY WITH MEALS (Patient taking differently: Take 375 mg by mouth 2 (two) times daily as needed (pain.).)  . rosuvastatin (CRESTOR) 10 MG tablet TAKE 1 TABLET DAILY  . rosuvastatin (CRESTOR) 20 MG tablet Take 1 tablet (20 mg total) by mouth daily.  Marland Kitchen SYNTHROID 75 MCG tablet TAKE 1 TABLET DAILY BEFORE BREAKFAST (Patient taking differently: Take 75 mcg by mouth daily before breakfast.)   No facility-administered encounter medications on file as of  11/05/2020.     ONCOLOGIC FAMILY HISTORY:  Family History  Problem Relation Age of Onset  . Stomach cancer Father   . Esophageal cancer Father   . Crohn's disease Daughter   . Ulcerative colitis Daughter   . Diabetes Maternal Grandmother   . Colon cancer Neg Hx   . Pancreatic cancer Neg Hx   . Liver disease Neg Hx     GENETIC COUNSELING/TESTING: Not at this time  SOCIAL HISTORY:  ELANY FELIX is divorced and lives with her daughter and grandson in [place], [place].  Her daughter passed in 2020.  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  She denies any current or history of or illicit  drug use.  She is a current every day smoker of 1ppd for 30+ years.  Not ready to quit today.  Occasional drink of beer.  (updated 11/05/2020)   PHYSICAL EXAMINATION:  Vital Signs: Vitals:   11/05/20 0903  BP: 138/81  Pulse: 78  Resp: 18  Temp: 97.9 F (36.6 C)  SpO2: 100%   Filed Weights   11/05/20 0903  Weight: 125 lb 3.2 oz (56.8 kg)   General: Well-nourished, well-appearing female in no acute distress.  Unaccompanied today.   HEENT: Head is normocephalic.  Pupils equal and reactive to light. Conjunctivae clear without exudate.  Sclerae anicteric. Oral mucosa is pink, moist.  Oropharynx is pink without lesions or erythema.  Lymph: No cervical, supraclavicular, or infraclavicular lymphadenopathy noted on palpation.  Cardiovascular: Regular rate and rhythm.Marland Kitchen Respiratory: Clear to auscultation bilaterally. Chest expansion symmetric; breathing non-labored.  Breast Exam:  -Left breast: No appreciable masses on palpation. No skin redness, thickening, or peau d'orange appearance; no nipple retraction or nipple discharge; mild distortion in symmetry at previous lumpectomy site well healed scar without erythema or nodularity.  -Right breast: No appreciable masses on palpation. No skin redness, thickening, or peau d'orange appearance; no nipple retraction or nipple discharge; -Axilla: No axillary adenopathy bilaterally.  GI: Abdomen soft and round; non-tender, non-distended. Bowel sounds normoactive. No hepatosplenomegaly.   GU: Deferred.  Neuro: No focal deficits. Steady gait.  Psych: Mood and affect normal and appropriate for situation.  MSK: No focal spinal tenderness to palpation, full range of motion in bilateral upper extremities Extremities: No edema. Skin: Warm and dry.  LABORATORY DATA:  None for this visit   DIAGNOSTIC IMAGING:  Most recent mammogram:    CLINICAL DATA:  Screening.  EXAM: DIGITAL SCREENING BILATERAL MAMMOGRAM WITH TOMO AND CAD  COMPARISON:  Previous  exam(s).  ACR Breast Density Category b: There are scattered areas of fibroglandular density.  FINDINGS: There are no findings suspicious for malignancy. Images were processed with CAD.  IMPRESSION: No mammographic evidence of malignancy. A result letter of this screening mammogram will be mailed directly to the patient.  RECOMMENDATION: Screening mammogram in one year. (Code:SM-B-01Y)  BI-RADS CATEGORY  1: Negative.   Electronically Signed   By: Franki Cabot M.D.   On: 04/26/2020 13:23  ASSESSMENT AND PLAN:  Ms.. Fons is a pleasant 65 y.o. 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  [facility] and routine follow-up.   1. History of breast cancer:  Ms. Tamas is currently clinically and radiographically without evidence of disease or recurrence of breast  cancer.  She will be due for repeat mammgoram in 04/2021.  We will see her back in one year for continued surveillance and monitoring   I encouraged her to call me with any questions or concerns before her next visit at the cancer center, and I would be happy to see her sooner, if needed.    2. Bone health:  Given Ms. Ferns's age, history of breast cancer, and her previous anti-estrogen therapy with Anastrozole, she is at risk for bone demineralization. She underwent repeat bone density testing that demonstrated osteopenia.  We discussed this and the fact that smoking increases her risk of osteopenia. She was given education on specific food and activities to promote bone health.  She is planning on repeating bone density testing in 2023.   3. Cancer screening:  Due to Ms. Piechowski's history and her age, she should receive screening for skin cancers, colon cancer, lung cancer and gynecologic cancers.  She was  encouraged to follow-up with her PCP for appropriate cancer screenings.   4. Health maintenance and wellness promotion: Ms. Yetman was encouraged to consume 5-7 servings of fruits and vegetables per day. She was also encouraged to engage in moderate to vigorous exercise for 30 minutes per day most days of the week. She was instructed to limit her alcohol consumption and stop smoking.    Dispo:  -Return to cancer center in one year for LTS follow up -Mammogram in 04/2021 -Colonoscopy with Dr. Rush Landmark between June and September of 2022 -Lung Cancer Screening 11/2020 -Bone density in 04/2022  Total encounter time: 30 minutes*  Wilber Bihari, NP 11/05/20 9:10 AM Medical Oncology and Hematology [facility] [place], [place] [address] Tel. [phone]    Fax. [phone]  *Total Encounter Time as defined by the Centers for Medicare and Medicaid Services includes, in addition to the face-to-face time of a patient visit (documented in the note above) non-face-to-face time: obtaining and reviewing outside history, ordering and reviewing medications, tests or procedures, care coordination (communications with other health care professionals or caregivers) and documentation in the medical record.   Note: PRIMARY CARE PROVIDER [name], [place] [phone]

## 2020-11-05 ENCOUNTER — Inpatient Hospital Stay: Attending: Adult Health | Admitting: Adult Health

## 2020-11-05 ENCOUNTER — Encounter: Payer: Self-pay | Admitting: Adult Health

## 2020-11-05 ENCOUNTER — Other Ambulatory Visit: Payer: Self-pay

## 2020-11-05 VITALS — BP 138/81 | HR 78 | Temp 97.9°F | Resp 18 | Ht 65.0 in | Wt 125.2 lb

## 2020-11-05 DIAGNOSIS — Z801 Family history of malignant neoplasm of trachea, bronchus and lung: Secondary | ICD-10-CM | POA: Insufficient documentation

## 2020-11-05 DIAGNOSIS — Z923 Personal history of irradiation: Secondary | ICD-10-CM | POA: Insufficient documentation

## 2020-11-05 DIAGNOSIS — Z79899 Other long term (current) drug therapy: Secondary | ICD-10-CM | POA: Diagnosis not present

## 2020-11-05 DIAGNOSIS — F17210 Nicotine dependence, cigarettes, uncomplicated: Secondary | ICD-10-CM | POA: Diagnosis not present

## 2020-11-05 DIAGNOSIS — C50412 Malignant neoplasm of upper-outer quadrant of left female breast: Secondary | ICD-10-CM | POA: Diagnosis not present

## 2020-11-05 DIAGNOSIS — I10 Essential (primary) hypertension: Secondary | ICD-10-CM | POA: Diagnosis not present

## 2020-11-05 DIAGNOSIS — Z170 Estrogen receptor positive status [ER+]: Secondary | ICD-10-CM

## 2020-11-05 DIAGNOSIS — Z833 Family history of diabetes mellitus: Secondary | ICD-10-CM | POA: Insufficient documentation

## 2020-11-05 DIAGNOSIS — E039 Hypothyroidism, unspecified: Secondary | ICD-10-CM | POA: Diagnosis not present

## 2020-11-05 DIAGNOSIS — Z853 Personal history of malignant neoplasm of breast: Secondary | ICD-10-CM | POA: Insufficient documentation

## 2020-11-05 DIAGNOSIS — Z800 Family history of malignant neoplasm of digestive organs: Secondary | ICD-10-CM | POA: Insufficient documentation

## 2020-11-05 DIAGNOSIS — F17200 Nicotine dependence, unspecified, uncomplicated: Secondary | ICD-10-CM | POA: Diagnosis not present

## 2020-11-05 DIAGNOSIS — F909 Attention-deficit hyperactivity disorder, unspecified type: Secondary | ICD-10-CM | POA: Diagnosis not present

## 2020-11-05 DIAGNOSIS — M8580 Other specified disorders of bone density and structure, unspecified site: Secondary | ICD-10-CM | POA: Diagnosis not present

## 2020-11-05 NOTE — Patient Instructions (Signed)

## 2020-11-08 ENCOUNTER — Telehealth: Payer: Self-pay | Admitting: Adult Health

## 2020-11-08 NOTE — Telephone Encounter (Signed)
Scheduled appts per 2/4 los. Pt confirmed appt date and time.  

## 2020-12-12 ENCOUNTER — Encounter: Payer: Self-pay | Admitting: Adult Health

## 2020-12-12 ENCOUNTER — Encounter: Payer: Self-pay | Admitting: Family Medicine

## 2020-12-12 DIAGNOSIS — F909 Attention-deficit hyperactivity disorder, unspecified type: Secondary | ICD-10-CM

## 2020-12-13 MED ORDER — AMPHETAMINE-DEXTROAMPHETAMINE 10 MG PO TABS: 10.0000 mg | ORAL_TABLET | Freq: Two times a day (BID) | ORAL | 0 refills | Status: DC

## 2020-12-13 NOTE — Telephone Encounter (Signed)
Ok to refill??  Last office visit/ refill 11/04/2020.

## 2020-12-29 ENCOUNTER — Other Ambulatory Visit: Payer: Self-pay

## 2020-12-29 ENCOUNTER — Ambulatory Visit (HOSPITAL_COMMUNITY)
Admission: RE | Admit: 2020-12-29 | Discharge: 2020-12-29 | Disposition: A | Discharge status: Home / Self Care | Source: Ambulatory Visit | Attending: Adult Health | Admitting: Adult Health

## 2020-12-29 DIAGNOSIS — F17200 Nicotine dependence, unspecified, uncomplicated: Secondary | ICD-10-CM | POA: Diagnosis not present

## 2020-12-30 ENCOUNTER — Encounter: Payer: Self-pay | Admitting: Family Medicine

## 2021-01-03 ENCOUNTER — Other Ambulatory Visit: Payer: Self-pay | Admitting: Family Medicine

## 2021-01-10 ENCOUNTER — Telehealth: Payer: Self-pay | Admitting: Adult Health

## 2021-01-10 ENCOUNTER — Encounter: Payer: Self-pay | Admitting: Family Medicine

## 2021-01-10 DIAGNOSIS — F909 Attention-deficit hyperactivity disorder, unspecified type: Secondary | ICD-10-CM

## 2021-01-10 DIAGNOSIS — F17200 Nicotine dependence, unspecified, uncomplicated: Secondary | ICD-10-CM

## 2021-01-10 MED ORDER — AMPHETAMINE-DEXTROAMPHETAMINE 10 MG PO TABS: 10.0000 mg | ORAL_TABLET | Freq: Two times a day (BID) | ORAL | 0 refills | Status: DC

## 2021-01-10 NOTE — Telephone Encounter (Signed)
Reviewed with patient her CT scan results.  She will need repeat lung cancer screening in 6 months which I have ordered.  My chart message sent to her with smoking cessation details.    Wilber Bihari, NP

## 2021-02-03 ENCOUNTER — Other Ambulatory Visit: Payer: Self-pay | Admitting: Family Medicine

## 2021-02-16 ENCOUNTER — Encounter: Payer: Self-pay | Admitting: Family Medicine

## 2021-02-16 DIAGNOSIS — F909 Attention-deficit hyperactivity disorder, unspecified type: Secondary | ICD-10-CM

## 2021-02-16 NOTE — Telephone Encounter (Signed)
Ok to refill??  Last office visit 11/04/2020.  Last refill 01/10/2021.

## 2021-02-17 MED ORDER — AMPHETAMINE-DEXTROAMPHETAMINE 10 MG PO TABS: 10.0000 mg | ORAL_TABLET | Freq: Two times a day (BID) | ORAL | 0 refills | Status: DC

## 2021-03-04 ENCOUNTER — Other Ambulatory Visit: Payer: Self-pay | Admitting: *Deleted

## 2021-03-04 DIAGNOSIS — I6523 Occlusion and stenosis of bilateral carotid arteries: Secondary | ICD-10-CM

## 2021-03-07 ENCOUNTER — Ambulatory Visit (HOSPITAL_COMMUNITY)
Admission: RE | Admit: 2021-03-07 | Discharge: 2021-03-07 | Disposition: A | Discharge status: Home / Self Care | Source: Ambulatory Visit | Attending: Surgery | Admitting: Surgery

## 2021-03-07 ENCOUNTER — Other Ambulatory Visit: Payer: Self-pay

## 2021-03-07 ENCOUNTER — Ambulatory Visit (INDEPENDENT_AMBULATORY_CARE_PROVIDER_SITE_OTHER): Admitting: Physician Assistant

## 2021-03-07 VITALS — BP 156/88 | HR 78 | Temp 98.0°F | Resp 14 | Ht 65.5 in | Wt 122.0 lb

## 2021-03-07 DIAGNOSIS — I6523 Occlusion and stenosis of bilateral carotid arteries: Secondary | ICD-10-CM | POA: Diagnosis present

## 2021-03-07 NOTE — Progress Notes (Signed)
Carotid Artery Follow-Up   VASCULAR SURGERY ASSESSMENT & PLAN:   [name] is a 65 y.o. 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  Duplex examination today shows increase in distal right ICA stenosis as compared to 18 months ago.  We reviewed the signs and symptoms of stroke/TIA and advised the patient to call EMS should these occur.   Continue optimal medical management of hypertension and follow-up with primary care physician. Encouraged complete smoking cessation. Continue the following medications: statin Follow-up in 1 year with carotid duplex ultrasound.  SUBJECTIVE:   The patient denies monocular blindness, slurred speech, facial drooping, unilateral extremity weakness or numbness. She has a history of carpal tunnel syndrome and is bothered by intermittent bilateral hand numbness.  PHYSICAL EXAM:   Vitals:   03/07/21 1050  Weight: 122 lb (55.3 kg)  Height: 5' 5.5" (1.664 m)   General appearance: Well-developed, well-nourished in no apparent distress Neurologic: Alert and oriented x4 , face symmetric, speech fluent Cardiovascular: Heart rate and rhythm are regular.  Pedal pulses are palpable.  No carotid bruits. Respirations: Nonlabored Abdomen: No palpable pulsatile mass Extremities: moves all well; gait: unaided without ataxia  NON-INVASIVE VASCULAR STUDIES  03/07/2021 Right distal ICA: 40-59% Left distal ICA: 40-59% NOTE: Increased velocity in the bilateral distal ICA  suggestive of fibromuscular dysplasia.  10/18/2017  Right distal ICA: 1-39% stenosis. Left distal ICA: 40-59% stenosis   PROBLEM LIST:    The patient's past medical history, past surgical history, family history, social history, allergy list and medication list are reviewed.   CURRENT MEDS:    Current Outpatient Medications:  .  acetaminophen (TYLENOL) 650 MG CR tablet, Take 650 mg  by mouth every 8 (eight) hours as needed for pain., Disp: , Rfl:  .  amphetamine-dextroamphetamine (ADDERALL) 10 MG tablet, Take 1 tablet (10 mg total) by mouth 2 (two) times daily., Disp: 60 tablet, Rfl: 0 .  B Complex-C (B-COMPLEX WITH VITAMIN C) tablet, Take 1 tablet by mouth daily., Disp: , Rfl:  .  calcium carbonate (TUMS - DOSED IN MG ELEMENTAL CALCIUM) 500 MG chewable tablet, Chew 1 tablet by mouth 3 (three) times daily as needed for indigestion or heartburn., Disp: , Rfl:  .  cholecalciferol (VITAMIN D) 1000 UNITS tablet, Take 1,000 Units by mouth daily., Disp: , Rfl:  .  ferrous sulfate 325 (65 FE) MG tablet, Take 325 mg by mouth daily with breakfast., Disp: , Rfl:  .  levothyroxine (SYNTHROID) 75 MCG tablet, TAKE 1 TABLET DAILY BEFORE BREAKFAST, Disp: 90 tablet, Rfl: 3 .  Multiple Vitamin (MULTIVITAMIN WITH MINERALS) TABS tablet, Take 1 tablet by mouth daily. Centrum Silver, Disp: , Rfl:  .  naproxen (NAPROSYN) 375 MG tablet, TAKE 1 TABLET TWICE A DAY WITH MEALS, Disp: 180 tablet, Rfl: 3 .  rosuvastatin (CRESTOR) 10 MG tablet, TAKE 1 TABLET DAILY, Disp: 90 tablet, Rfl: 3 .  rosuvastatin (CRESTOR) 20 MG tablet, Take 1 tablet (20 mg total) by mouth daily., Disp: 90 tablet, Rfl: 3   REVIEW OF SYSTEMS:   [X]  denotes positive finding, [ ]  denotes negative finding Cardiac  Comments:  Chest pain or chest pressure:    Shortness of breath upon exertion:    Short of breath when lying flat:    Irregular heart rhythm:        Vascular    Pain in calf, thigh, or hip brought on by ambulation:  Pain in feet at night that wakes you up from your sleep:     Blood clot in your veins:    Leg swelling:         Pulmonary    Oxygen at home:    Productive cough:     Wheezing:         Neurologic    Sudden weakness in arms or legs:     Sudden numbness in arms or legs:     Sudden onset of difficulty speaking or slurred speech:    Temporary loss of vision in one eye:     Problems with dizziness:          Gastrointestinal    Blood in stool:     Vomited blood:         Genitourinary    Burning when urinating:     Blood in urine:        Psychiatric    Major depression:         Hematologic    Bleeding problems:    Problems with blood clotting too easily:        Skin    Rashes or ulcers:        Constitutional    Fever or chills:     Barbie Banner, PA-C  Office: [phone] 03/07/2021  Clinic MD: Dr. Stanford Breed on-call

## 2021-03-25 ENCOUNTER — Encounter: Payer: Self-pay | Admitting: Family Medicine

## 2021-03-25 DIAGNOSIS — F909 Attention-deficit hyperactivity disorder, unspecified type: Secondary | ICD-10-CM

## 2021-03-25 MED ORDER — AMPHETAMINE-DEXTROAMPHETAMINE 10 MG PO TABS: 10.0000 mg | ORAL_TABLET | Freq: Two times a day (BID) | ORAL | 0 refills | Status: DC

## 2021-03-25 NOTE — Telephone Encounter (Signed)
Ok to refill??  Last office visit 11/04/2020.   Last refill 02/17/2021.

## 2021-04-25 ENCOUNTER — Encounter: Payer: Self-pay | Admitting: Family Medicine

## 2021-04-25 DIAGNOSIS — F909 Attention-deficit hyperactivity disorder, unspecified type: Secondary | ICD-10-CM

## 2021-04-25 MED ORDER — AMPHETAMINE-DEXTROAMPHETAMINE 10 MG PO TABS: 10.0000 mg | ORAL_TABLET | Freq: Two times a day (BID) | ORAL | 0 refills | Status: DC

## 2021-04-25 NOTE — Telephone Encounter (Signed)
Ok to refill??  Last office visit 11/04/2020.  Last refill 03/25/2021.

## 2021-05-19 ENCOUNTER — Other Ambulatory Visit: Payer: Self-pay

## 2021-05-19 ENCOUNTER — Telehealth: Payer: Self-pay

## 2021-05-19 DIAGNOSIS — D128 Benign neoplasm of rectum: Secondary | ICD-10-CM

## 2021-05-19 DIAGNOSIS — D124 Benign neoplasm of descending colon: Secondary | ICD-10-CM

## 2021-05-19 DIAGNOSIS — K5904 Chronic idiopathic constipation: Secondary | ICD-10-CM

## 2021-05-19 DIAGNOSIS — Z86010 Personal history of colonic polyps: Secondary | ICD-10-CM

## 2021-05-19 DIAGNOSIS — D125 Benign neoplasm of sigmoid colon: Secondary | ICD-10-CM

## 2021-05-19 NOTE — Telephone Encounter (Signed)
Any medical history changes since last OV? NO   Any medication changes since last OV?  No   Any  type of blood thinner? No   Do you use oxygen at any time? No   Patient has been scheduled at [facility] on 06/14/21 for a Colonoscopy.  Prep if needed has been sent to their pharmacy. They are asked to arrive at 9:00am or a 10:30am procedure.  Instructions reviewed with the patient and mailed to their home.  They will call back with any questions once they are received.

## 2021-05-24 ENCOUNTER — Encounter: Payer: Self-pay | Admitting: Family Medicine

## 2021-05-24 DIAGNOSIS — F909 Attention-deficit hyperactivity disorder, unspecified type: Secondary | ICD-10-CM

## 2021-05-25 NOTE — Telephone Encounter (Signed)
Ok to refill??  Last office visit 11/04/2020.  Last refill 04/25/2021.

## 2021-05-26 MED ORDER — AMPHETAMINE-DEXTROAMPHETAMINE 10 MG PO TABS: 10.0000 mg | ORAL_TABLET | Freq: Two times a day (BID) | ORAL | 0 refills | Status: DC

## 2021-06-02 ENCOUNTER — Ambulatory Visit (HOSPITAL_COMMUNITY)
Admission: RE | Admit: 2021-06-02 | Discharge: 2021-06-02 | Disposition: A | Discharge status: Home / Self Care | Source: Ambulatory Visit | Attending: Adult Health | Admitting: Adult Health

## 2021-06-02 ENCOUNTER — Other Ambulatory Visit: Payer: Self-pay | Admitting: Adult Health

## 2021-06-02 ENCOUNTER — Other Ambulatory Visit: Payer: Self-pay

## 2021-06-02 DIAGNOSIS — F17200 Nicotine dependence, unspecified, uncomplicated: Secondary | ICD-10-CM | POA: Diagnosis not present

## 2021-06-07 ENCOUNTER — Other Ambulatory Visit: Payer: Self-pay

## 2021-06-08 ENCOUNTER — Telehealth: Payer: Self-pay | Admitting: *Deleted

## 2021-06-08 NOTE — Telephone Encounter (Signed)
Per Request of Wilber Bihari, NP RN placed good news call to pt regarding recent  CT chest being negative for lung cancer.  Pt verbalized understanding and appreciative of advice.

## 2021-06-14 ENCOUNTER — Encounter (HOSPITAL_COMMUNITY)
Admission: RE | Disposition: A | Discharge status: Home / Self Care | Payer: Self-pay | Source: Home / Self Care | Attending: Gastroenterology

## 2021-06-14 ENCOUNTER — Other Ambulatory Visit: Payer: Self-pay

## 2021-06-14 ENCOUNTER — Ambulatory Visit (HOSPITAL_COMMUNITY): Admitting: Anesthesiology

## 2021-06-14 ENCOUNTER — Encounter (HOSPITAL_COMMUNITY): Payer: Self-pay | Admitting: Gastroenterology

## 2021-06-14 ENCOUNTER — Ambulatory Visit (HOSPITAL_COMMUNITY)
Admission: RE | Admit: 2021-06-14 | Discharge: 2021-06-14 | Disposition: A | Discharge status: Home / Self Care | Attending: Gastroenterology | Admitting: Gastroenterology

## 2021-06-14 DIAGNOSIS — Z923 Personal history of irradiation: Secondary | ICD-10-CM | POA: Diagnosis not present

## 2021-06-14 DIAGNOSIS — F17210 Nicotine dependence, cigarettes, uncomplicated: Secondary | ICD-10-CM | POA: Diagnosis not present

## 2021-06-14 DIAGNOSIS — Z09 Encounter for follow-up examination after completed treatment for conditions other than malignant neoplasm: Secondary | ICD-10-CM | POA: Insufficient documentation

## 2021-06-14 DIAGNOSIS — D125 Benign neoplasm of sigmoid colon: Secondary | ICD-10-CM

## 2021-06-14 DIAGNOSIS — D122 Benign neoplasm of ascending colon: Secondary | ICD-10-CM

## 2021-06-14 DIAGNOSIS — Z86718 Personal history of other venous thrombosis and embolism: Secondary | ICD-10-CM | POA: Diagnosis not present

## 2021-06-14 DIAGNOSIS — K5904 Chronic idiopathic constipation: Secondary | ICD-10-CM

## 2021-06-14 DIAGNOSIS — K6389 Other specified diseases of intestine: Secondary | ICD-10-CM | POA: Diagnosis not present

## 2021-06-14 DIAGNOSIS — K6289 Other specified diseases of anus and rectum: Secondary | ICD-10-CM

## 2021-06-14 DIAGNOSIS — K641 Second degree hemorrhoids: Secondary | ICD-10-CM | POA: Insufficient documentation

## 2021-06-14 DIAGNOSIS — D127 Benign neoplasm of rectosigmoid junction: Secondary | ICD-10-CM | POA: Diagnosis not present

## 2021-06-14 DIAGNOSIS — Q438 Other specified congenital malformations of intestine: Secondary | ICD-10-CM | POA: Insufficient documentation

## 2021-06-14 DIAGNOSIS — Z86010 Personal history of colonic polyps: Secondary | ICD-10-CM | POA: Diagnosis not present

## 2021-06-14 DIAGNOSIS — D120 Benign neoplasm of cecum: Secondary | ICD-10-CM | POA: Diagnosis not present

## 2021-06-14 DIAGNOSIS — Z800 Family history of malignant neoplasm of digestive organs: Secondary | ICD-10-CM | POA: Diagnosis not present

## 2021-06-14 DIAGNOSIS — D128 Benign neoplasm of rectum: Secondary | ICD-10-CM

## 2021-06-14 DIAGNOSIS — Z833 Family history of diabetes mellitus: Secondary | ICD-10-CM | POA: Insufficient documentation

## 2021-06-14 DIAGNOSIS — Z8379 Family history of other diseases of the digestive system: Secondary | ICD-10-CM | POA: Diagnosis not present

## 2021-06-14 DIAGNOSIS — D123 Benign neoplasm of transverse colon: Secondary | ICD-10-CM | POA: Diagnosis not present

## 2021-06-14 DIAGNOSIS — K644 Residual hemorrhoidal skin tags: Secondary | ICD-10-CM | POA: Insufficient documentation

## 2021-06-14 DIAGNOSIS — D124 Benign neoplasm of descending colon: Secondary | ICD-10-CM

## 2021-06-14 DIAGNOSIS — F17290 Nicotine dependence, other tobacco product, uncomplicated: Secondary | ICD-10-CM | POA: Insufficient documentation

## 2021-06-14 DIAGNOSIS — Z880 Allergy status to penicillin: Secondary | ICD-10-CM | POA: Insufficient documentation

## 2021-06-14 HISTORY — PX: BIOPSY: SHX5522

## 2021-06-14 HISTORY — PX: POLYPECTOMY: SHX5525

## 2021-06-14 HISTORY — PX: COLONOSCOPY WITH PROPOFOL: SHX5780

## 2021-06-14 SURGERY — COLONOSCOPY WITH PROPOFOL
Anesthesia: General

## 2021-06-14 MED ORDER — LACTATED RINGERS IV SOLN
INTRAVENOUS | Status: DC
  Administered 2021-06-14: 1000 mL via INTRAVENOUS

## 2021-06-14 MED ORDER — PHENYLEPHRINE 40 MCG/ML (10ML) SYRINGE FOR IV PUSH (FOR BLOOD PRESSURE SUPPORT)
PREFILLED_SYRINGE | INTRAVENOUS | Status: DC | PRN
  Administered 2021-06-14 (×2): 120 ug via INTRAVENOUS

## 2021-06-14 MED ORDER — SODIUM CHLORIDE 0.9 % IV SOLN: INTRAVENOUS | Status: DC

## 2021-06-14 MED ORDER — PROPOFOL 10 MG/ML IV BOLUS
INTRAVENOUS | Status: DC | PRN
  Administered 2021-06-14: 30 mg via INTRAVENOUS
  Administered 2021-06-14: 60 mg via INTRAVENOUS
  Administered 2021-06-14: 20 mg via INTRAVENOUS
  Administered 2021-06-14: 40 mg via INTRAVENOUS
  Administered 2021-06-14: 200 mg via INTRAVENOUS

## 2021-06-14 MED ORDER — LIDOCAINE 2% (20 MG/ML) 5 ML SYRINGE
INTRAMUSCULAR | Status: DC | PRN
  Administered 2021-06-14: 60 mg via INTRAVENOUS

## 2021-06-14 MED ORDER — ESMOLOL HCL 100 MG/10ML IV SOLN
INTRAVENOUS | Status: DC | PRN
  Administered 2021-06-14: 20 mg via INTRAVENOUS
  Administered 2021-06-14: 10 mg via INTRAVENOUS

## 2021-06-14 MED ORDER — EPHEDRINE SULFATE-NACL 50-0.9 MG/10ML-% IV SOSY
PREFILLED_SYRINGE | INTRAVENOUS | Status: DC | PRN
  Administered 2021-06-14: 15 mg via INTRAVENOUS

## 2021-06-14 MED ORDER — DEXMEDETOMIDINE (PRECEDEX) IN NS 20 MCG/5ML (4 MCG/ML) IV SYRINGE
PREFILLED_SYRINGE | INTRAVENOUS | Status: AC
  Filled 2021-06-14: qty 10

## 2021-06-14 MED ORDER — DEXAMETHASONE SODIUM PHOSPHATE 4 MG/ML IJ SOLN
INTRAMUSCULAR | Status: DC | PRN
  Administered 2021-06-14: 10 mg via INTRAVENOUS

## 2021-06-14 MED ORDER — ONDANSETRON HCL 4 MG/2ML IJ SOLN
INTRAMUSCULAR | Status: DC | PRN
  Administered 2021-06-14: 4 mg via INTRAVENOUS

## 2021-06-14 SURGICAL SUPPLY — 22 items

## 2021-06-14 NOTE — H&P (Signed)
GASTROENTEROLOGY PROCEDURE H&P NOTE   Primary Care Physician: [name], MD  HPI: [name] is a 65 y.o. female who presents for Colonoscopy for follow up piecemeal EMR 2 large AC polyps in 12/21.  Past Medical History:  Diagnosis Date   ADHD (attention deficit hyperactivity disorder) 09/07/2011   Anesthesia complication 26/37/8588   Will need GETA for future endoscopy procedures. Did not tolerate MAC well.   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   Diverticulosis    DVT (deep venous thrombosis) ([place]) 09/07/2011   Hypertension    on no medications   Hypothyroid 09/07/2011   Internal hemorrhoids    Iron deficiency anemia    Personal history of radiation therapy 2012   Pneumonia    Smoker 02/17/2013   Tubular adenoma of colon    Past Surgical History:  Procedure Laterality Date   ABDOMINAL HYSTERECTOMY     BIOPSY  12/04/2019   Procedure: BIOPSY;  Surgeon: Juanita Craver, MD;  Location: [facility];  Service: Endoscopy;;   BREAST LUMPECTOMY  March 2013   Left breast   COLONOSCOPY     COLONOSCOPY WITH PROPOFOL N/A 12/04/2019   Procedure: COLONOSCOPY WITH PROPOFOL;  Surgeon: Juanita Craver, MD;  Location: [facility];  Service: Endoscopy;  Laterality: N/A;   COLONOSCOPY WITH PROPOFOL N/A 09/20/2020   Procedure: COLONOSCOPY WITH PROPOFOL;  Surgeon: Rush Landmark Telford Nab., MD;  Location: [place];  Service: Gastroenterology;  Laterality: N/A;   ENDOSCOPIC MUCOSAL RESECTION N/A 09/20/2020   Procedure: ENDOSCOPIC MUCOSAL RESECTION;  Surgeon: Rush Landmark Telford Nab., MD;  Location: [place];  Service: Gastroenterology;  Laterality: N/A;   ESOPHAGOGASTRODUODENOSCOPY (EGD) WITH PROPOFOL N/A 12/04/2019   Procedure: ESOPHAGOGASTRODUODENOSCOPY (EGD) WITH PROPOFOL;  Surgeon: Juanita Craver, MD;  Location: [facility];   Service: Endoscopy;  Laterality: N/A;   EYE SURGERY Right Sept. 2014   Lasix - Laser   HEMOSTASIS CLIP PLACEMENT  09/20/2020   Procedure: HEMOSTASIS CLIP PLACEMENT;  Surgeon: Irving Copas., MD;  Location: [place];  Service: Gastroenterology;;   POLYPECTOMY  12/04/2019   Procedure: POLYPECTOMY;  Surgeon: Juanita Craver, MD;  Location: [facility];  Service: Endoscopy;;   POLYPECTOMY  09/20/2020   Procedure: POLYPECTOMY;  Surgeon: Irving Copas., MD;  Location: [place];  Service: Gastroenterology;;   SUBMUCOSAL LIFTING INJECTION  09/20/2020   Procedure: SUBMUCOSAL LIFTING INJECTION;  Surgeon: Irving Copas., MD;  Location: [place];  Service: Gastroenterology;;   UPPER GASTROINTESTINAL ENDOSCOPY     Current Facility-Administered Medications  Medication Dose Route Frequency Provider Last Rate Last Admin   0.9 %  sodium chloride infusion   Intravenous Continuous Mansouraty, Telford Nab., MD       lactated ringers infusion   Intravenous Continuous Mansouraty, Telford Nab., MD 10 mL/hr at 06/14/21 1016 1,000 mL at 06/14/21 1016    Current Facility-Administered Medications:    0.9 %  sodium chloride infusion, , Intravenous, Continuous, Mansouraty, Telford Nab., MD   lactated ringers infusion, , Intravenous, Continuous, Mansouraty, Telford Nab., MD, Last Rate: 10 mL/hr at 06/14/21 1016, 1,000 mL at 06/14/21 1016 Allergies  Allergen Reactions   Penicillins Hives    Did it involve swelling of the face/tongue/throat, SOB, or low BP? No Did it involve sudden or severe rash/hives, skin peeling, or any reaction on the inside of  your mouth or nose? No Did you need to seek medical attention at a hospital or doctor's office? Yes When did it last happen?      35 years If all above answers are "NO", may proceed with cephalosporin use.   Family History  Problem Relation Age of Onset   Stomach cancer Father    Esophageal cancer Father    Crohn's disease Daughter     Ulcerative colitis Daughter    Diabetes Maternal Grandmother    Colon cancer Neg Hx    Pancreatic cancer Neg Hx    Liver disease Neg Hx    Social History   Socioeconomic History   Marital status: Divorced    Spouse name: Not on file   Number of children: Not on file   Years of education: Not on file   Highest education level: Not on file  Occupational History   Not on file  Tobacco Use   Smoking status: Every Day    Packs/day: 1.00    Years: 40.00    Pack years: 40.00    Types: E-cigarettes, Cigarettes   Smokeless tobacco: Never  Vaping Use   Vaping Use: Never used  Substance and Sexual Activity   Alcohol use: Yes    Alcohol/week: 0.0 standard drinks    Comment: 4 mixed drinks per week    Drug use: No   Sexual activity: Never  Other Topics Concern   Not on file  Social History Narrative   Not on file   Social Determinants of Health   Financial Resource Strain: Not on file  Food Insecurity: Not on file  Transportation Needs: Not on file  Physical Activity: Not on file  Stress: Not on file  Social Connections: Not on file  Intimate Partner Violence: Not on file    Physical Exam: Vital signs in last 24 hours: Temp:  [97.9 F (36.6 C)] 97.9 F (36.6 C) (09/13 1012) Pulse Rate:  [77] 77 (09/13 1012) Resp:  [15] 15 (09/13 1012) BP: (169)/(92) 169/92 (09/13 1012) SpO2:  [95 %] 95 % (09/13 1012) Weight:  [55.3 kg] 55.3 kg (09/13 1012)   GEN: NAD EYE: Sclerae anicteric ENT: MMM CV: Non-tachycardic GI: Soft, NT/ND NEURO:  Alert & Oriented x 3  Lab Results: No results for input(s): WBC, HGB, HCT, PLT in the last 72 hours. BMET No results for input(s): NA, K, CL, CO2, GLUCOSE, BUN, CREATININE, CALCIUM in the last 72 hours. LFT No results for input(s): PROT, ALBUMIN, AST, ALT, ALKPHOS, BILITOT, BILIDIR, IBILI in the last 72 hours. PT/INR No results for input(s): LABPROT, INR in the last 72 hours.   Impression / Plan: This is a 65 y.o.female who presents  for Colonoscopy for follow up piecemeal EMR 2 large AC polyps in 12/21.  The risks and benefits of endoscopic evaluation/treatment were discussed with the patient and/or family; these include but are not limited to the risk of perforation, infection, bleeding, missed lesions, lack of diagnosis, severe illness requiring hospitalization, as well as anesthesia and sedation related illnesses.  The patient's history has been reviewed, patient examined, no change in status, and deemed stable for procedure.  The patient and/or family is agreeable to proceed.    Justice Britain, MD [facility] Office # [phone]

## 2021-06-14 NOTE — Discharge Instructions (Signed)
YOU HAD AN ENDOSCOPIC PROCEDURE TODAY: Refer to the procedure report and other information in the discharge instructions given to you for any specific questions about what was found during the examination. If this information does not answer your questions, please call [place] office at [phone] to clarify.  ° °YOU SHOULD EXPECT: Some feelings of bloating in the abdomen. Passage of more gas than usual. Walking can help get rid of the air that was put into your GI tract during the procedure and reduce the bloating. If you had a lower endoscopy (such as a colonoscopy or flexible sigmoidoscopy) you may notice spotting of blood in your stool or on the toilet paper. Some abdominal soreness may be present for a day or two, also. ° °DIET: Your first meal following the procedure should be a light meal and then it is ok to progress to your normal diet. A half-sandwich or bowl of soup is an example of a good first meal. Heavy or fried foods are harder to digest and may make you feel nauseous or bloated. Drink plenty of fluids but you should avoid alcoholic beverages for 24 hours. If you had a esophageal dilation, please see attached instructions for diet.   ° °ACTIVITY: Your care partner should take you home directly after the procedure. You should plan to take it easy, moving slowly for the rest of the day. You can resume normal activity the day after the procedure however YOU SHOULD NOT DRIVE, use power tools, machinery or perform tasks that involve climbing or major physical exertion for 24 hours (because of the sedation medicines used during the test).  ° °SYMPTOMS TO REPORT IMMEDIATELY: °A gastroenterologist can be reached at any hour. Please call [phone]  for any of the following symptoms:  °Following lower endoscopy (colonoscopy, flexible sigmoidoscopy) °Excessive amounts of blood in the stool  °Significant tenderness, worsening of abdominal pains  °Swelling of the abdomen that is new, acute  °Fever of 100° or  higher  °Following upper endoscopy (EGD, EUS, ERCP, esophageal dilation) °Vomiting of blood or coffee ground material  °New, significant abdominal pain  °New, significant chest pain or pain under the shoulder blades  °Painful or persistently difficult swallowing  °New shortness of breath  °Black, tarry-looking or red, bloody stools ° °FOLLOW UP:  °If any biopsies were taken you will be contacted by phone or by letter within the next 1-3 weeks. Call [phone]  if you have not heard about the biopsies in 3 weeks.  °Please also call with any specific questions about appointments or follow up tests. ° °

## 2021-06-14 NOTE — Anesthesia Preprocedure Evaluation (Addendum)
Anesthesia Evaluation  Patient identified by MRN, date of birth, ID band Patient awake    Reviewed: Allergy & Precautions, NPO status , Patient's Chart, lab work & pertinent test results  History of Anesthesia Complications Negative for: history of anesthetic complications  Airway Mallampati: II  TM Distance: >3 FB Neck ROM: Full    Dental  (+) Teeth Intact, Dental Advisory Given   Pulmonary neg shortness of breath, neg sleep apnea, neg COPD, neg recent URI, Current Smoker and Patient abstained from smoking.,    breath sounds clear to auscultation       Cardiovascular hypertension, + CAD, + Peripheral Vascular Disease and + DVT   Rhythm:Regular  Coronary artery calcification seen on CAT scan  EF normal, No wall motion abnormalities on tte   Neuro/Psych negative neurological ROS     GI/Hepatic Neg liver ROS, colon polyps   Endo/Other  Hypothyroidism   Renal/GU negative Renal ROS     Musculoskeletal  (+) Arthritis ,   Abdominal   Peds  (+) ADHD Hematology negative hematology ROS (+) Lab Results      Component                Value               Date                      WBC                      7.8                 10/26/2020                HGB                      13.6                10/26/2020                HCT                      40.2                10/26/2020                MCV                      96.2                10/26/2020                PLT                      494 (H)             10/26/2020              Anesthesia Other Findings   Reproductive/Obstetrics                            Anesthesia Physical Anesthesia Plan  ASA: 2  Anesthesia Plan: General   Post-op Pain Management:    Induction: Intravenous  PONV Risk Score and Plan: 2 and Treatment may vary due to age or medical condition, Ondansetron and Dexamethasone  Airway Management Planned: LMA  Additional  Equipment: None  Intra-op Plan:   Post-operative  Plan: Extubation in OR  Informed Consent: I have reviewed the patients History and Physical, chart, labs and discussed the procedure including the risks, benefits and alternatives for the proposed anesthesia with the patient or authorized representative who has indicated his/her understanding and acceptance.     Dental advisory given  Plan Discussed with: CRNA and Anesthesiologist  Anesthesia Plan Comments:        Anesthesia Quick Evaluation

## 2021-06-14 NOTE — Op Note (Signed)
[facility] Patient Name: [name] Procedure Date: 06/14/2021 MRN: [id number] Attending MD: Gabriel Mansouraty , MD Date of Birth: 05/07/1959 CSN: [id number] Age: 65 Admit Type: Outpatient Procedure:                Colonoscopy Indications:              Surveillance: Personal history of piecemeal removal                            of adenoma on last colonoscopy (less than 1 year                            ago) Providers:                Gabriel Mansouraty, MD, Jarmila Fucs RN, RN,                            Allison Townsend, Technician, April Carter, CRNA Referring MD:             Kimberly Beavers MD, MD, Warren T. Pickard Medicines:                General Anesthesia (this was agreed upon prior to                            procedure due to sedation issues from prior                            colonoscopy) Complications:            No immediate complications. Estimated Blood Loss:     Estimated blood loss was minimal. Procedure:                Pre-Anesthesia Assessment:                           - Prior to the procedure, a History and Physical                            was performed, and patient medications and                            allergies were reviewed. The patient's tolerance of                            previous anesthesia was also reviewed. The risks                            and benefits of the procedure and the sedation                            options and risks were discussed with the patient.                            All questions were answered, and informed consent                              was obtained. Prior Anticoagulants: The patient has                            taken no previous anticoagulant or antiplatelet                            agents. ASA Grade Assessment: II - A patient with                            mild systemic disease. After reviewing the risks                            and benefits, the patient was deemed in                             satisfactory condition to undergo the procedure.                           After obtaining informed consent, the colonoscope                            was passed under direct vision. Throughout the                            procedure, the patient's blood pressure, pulse, and                            oxygen saturations were monitored continuously. The                            PCF-HQ190L ([id number]) Olympus colonoscope was                            introduced through the anus and advanced to the the                            cecum, identified by appendiceal orifice and                            ileocecal valve. The colonoscopy was somewhat                            difficult due to a redundant colon. Successful                            completion of the procedure was aided by changing                            the patient's position, using manual pressure,                            withdrawing and reinserting the scope,  straightening and shortening the scope to obtain                            bowel loop reduction and using scope torsion. The                            patient tolerated the procedure. The quality of the                            bowel preparation was adequate. The ileocecal                            valve, appendiceal orifice, and rectum were                            photographed. Scope In: 10:48:33 AM Scope Out: 11:38:46 AM Scope Withdrawal Time: 0 hours 43 minutes 19 seconds  Total Procedure Duration: 0 hours 50 minutes 13 seconds  Findings:      The digital rectal exam findings include decreased sphincter tone and       hemorrhoids. Pertinent negatives include no palpable rectal lesions.      The colon (entire examined portion) was grossly redundant.      A medium post mucosectomy scar was found in the proximal ascending       colon. There was no evidence of the previous polyp.      A large post mucosectomy  scar was found in the mid ascending colon.       There was some residual polypoid tissue with what appeared to be       possible recurrence in the center of the lesion. Preparations were made       for mucosal resection. Using NBI and White-light endoscopy, I evaluated       the entirety of the scar site and saw the regions that needed to be       worked on. Piecemeal mucosal resection using a snare was then performed       and to further aid in resection, Avulsion was performed in the central       portion of the scar. Resection and retrieval were felt to be complete.      A medium post polypectomy scar was found in the descending colon.      Eight sessile polyps were found in the recto-sigmoid colon (1),       transverse colon (3), ascending colon (2), and cecum (2). The polyps       were 2 to 8 mm in size. These polyps were removed with a cold snare.       Resection and retrieval were complete.      Normal mucosa was found in the entire colon otherwise.      Non-bleeding non-thrombosed external and internal hemorrhoids were found       during retroflexion, during perianal exam and during digital exam. The       hemorrhoids were Grade II (internal hemorrhoids that prolapse but reduce       spontaneously). Impression:               - Decreased sphincter tone and hemorrhoids found on  digital rectal exam.                           - Grossly redundant colon.                           - Eight, 2 to 8 mm polyps at the recto-sigmoid                            colon, in the transverse colon, in the ascending                            colon and in the cecum, removed with a cold snare.                            Resected and retrieved.                           - Post mucosectomy scar in the proximal ascending                            colon.                           - Post mucosectomy scar in the mid ascending colon                            with evidence of some  likely recurrence in central                            scar. Mucosal resection via snare and Avulsion was                            performed.                           - Normal mucosa in the entire examined colon                            otherwise.                           - Non-bleeding non-thrombosed external and internal                            hemorrhoids. Moderate Sedation:      Not Applicable - Patient had care per Anesthesia. Recommendation:           - The patient will be observed post-procedure,                            until all discharge criteria are met.                           - Discharge patient to home.                           -  Patient has a contact number available for                            emergencies. The signs and symptoms of potential                            delayed complications were discussed with the                            patient. Return to normal activities tomorrow.                            Written discharge instructions were provided to the                            patient.                           - Resume previous diet.                           - Continue present medications.                           - No aspirin, ibuprofen, naproxen, or other                            non-steroidal anti-inflammatory drugs for 2 weeks.                           - Await pathology results.                           - Repeat colonoscopy in 1 year for surveillance.                            Endorotor and FTRD will be necessary if recurrence                            is noted.                           - The findings and recommendations were discussed                            with the patient.                           - The findings and recommendations were discussed                            with the patient's family. Procedure Code(s):        --- Professional ---                           45390, Colonoscopy, flexible; with endoscopic                               mucosal resection                           45385, 59, Colonoscopy, flexible; with removal of                            tumor(s), polyp(s), or other lesion(s) by snare                            technique Diagnosis Code(s):        --- Professional ---                           K62.89, Other specified diseases of anus and rectum                           K63.5, Polyp of colon                           Z98.890, Other specified postprocedural states                           K64.1, Second degree hemorrhoids                           Z09, Encounter for follow-up examination after                            completed treatment for conditions other than                            malignant neoplasm                           Z86.010, Personal history of colonic polyps                           Q43.8, Other specified congenital malformations of                            intestine CPT copyright 2019 [employer]. All rights reserved. The codes documented in this report are preliminary and upon coder review may  be revised to meet current compliance requirements. Gabriel Mansouraty, MD 06/14/2021 11:53:41 AM Number of Addenda: 0 

## 2021-06-14 NOTE — Transfer of Care (Signed)
Immediate Anesthesia Transfer of Care Note  Patient: [name]  Procedure(s) Performed: COLONOSCOPY WITH PROPOFOL POLYPECTOMY BIOPSY  Patient Location: PACU  Anesthesia Type:General  Level of Consciousness: awake  Airway & Oxygen Therapy: Patient Spontanous Breathing and Patient connected to face mask oxygen  Post-op Assessment: Report given to RN and Post -op Vital signs reviewed and stable  Post vital signs: Reviewed and stable  Last Vitals:  Vitals Value Taken Time  BP 130/76 06/14/21 1150  Temp    Pulse 67 06/14/21 1151  Resp 15 06/14/21 1151  SpO2 100 % 06/14/21 1151  Vitals shown include unvalidated device data.  Last Pain:  Vitals:   06/14/21 1012  TempSrc: Oral  PainSc: 0-No pain         Complications: No notable events documented.

## 2021-06-14 NOTE — Anesthesia Procedure Notes (Signed)
Procedure Name: LMA Insertion Date/Time: 06/14/2021 10:42 AM Performed by: Lieutenant Diego, CRNA Pre-anesthesia Checklist: Patient identified, Emergency Drugs available, Suction available and Patient being monitored Patient Re-evaluated:Patient Re-evaluated prior to induction Oxygen Delivery Method: Circle system utilized Preoxygenation: Pre-oxygenation with 100% oxygen Induction Type: IV induction Ventilation: Mask ventilation without difficulty LMA: LMA inserted LMA Size: 4.0 Number of attempts: 1 Airway Equipment and Method: Bite block Placement Confirmation: positive ETCO2 and breath sounds checked- equal and bilateral Tube secured with: Tape Dental Injury: Teeth and Oropharynx as per pre-operative assessment

## 2021-06-15 ENCOUNTER — Encounter: Payer: Self-pay | Admitting: Gastroenterology

## 2021-06-15 LAB — SURGICAL PATHOLOGY

## 2021-06-16 ENCOUNTER — Encounter (HOSPITAL_COMMUNITY): Payer: Self-pay | Admitting: Gastroenterology

## 2021-06-16 ENCOUNTER — Telehealth: Payer: Self-pay | Admitting: Gastroenterology

## 2021-06-16 NOTE — Telephone Encounter (Signed)
Inbound call from pt requesting a call back stating that she couldn't remember what Dr. Rush Landmark wanted her to take for constipation. Please advise. Thank you.

## 2021-06-16 NOTE — Telephone Encounter (Signed)
Return call to patient. Pt was referring to directions for Fiber Con. Pt was advise to use Fiber Con 1-2 tablet daily as tolerated. Pt voiced understanding.

## 2021-06-17 ENCOUNTER — Telehealth: Payer: Self-pay

## 2021-06-17 NOTE — Telephone Encounter (Signed)
-----   Message from Thornton Park, MD sent at 06/16/2021  3:06 PM EDT ----- Many thanks for your help.  Patty, please confirm that the patient is in surveillance for colonoscopy with Dr. Rush Landmark in one year. Thanks.  KLB ----- Message ----- From: Timothy Lasso, RN Sent: 06/15/2021  12:26 PM EDT To: [name], MD, Thornton Park, MD

## 2021-06-17 NOTE — Anesthesia Postprocedure Evaluation (Signed)
Anesthesia Post Note  Patient: [name]  Procedure(s) Performed: COLONOSCOPY WITH PROPOFOL POLYPECTOMY BIOPSY     Patient location during evaluation: Endoscopy Anesthesia Type: General Level of consciousness: awake and alert Pain management: pain level controlled Vital Signs Assessment: post-procedure vital signs reviewed and stable Respiratory status: spontaneous breathing, nonlabored ventilation, respiratory function stable and patient connected to nasal cannula oxygen Cardiovascular status: blood pressure returned to baseline and stable Postop Assessment: no apparent nausea or vomiting Anesthetic complications: no   No notable events documented.  Last Vitals:  Vitals:   06/14/21 1200 06/14/21 1210  BP: 138/75 135/86  Pulse: 73 69  Resp: 14 11  Temp:    SpO2: 100% 100%    Last Pain:  Vitals:   06/16/21 1309  TempSrc:   PainSc: 0-No pain                 Mickael Mcnutt

## 2021-06-17 NOTE — Telephone Encounter (Signed)
1 year hospital colon recall in epic.

## 2021-06-30 ENCOUNTER — Encounter: Payer: Self-pay | Admitting: Family Medicine

## 2021-06-30 DIAGNOSIS — F909 Attention-deficit hyperactivity disorder, unspecified type: Secondary | ICD-10-CM

## 2021-07-04 NOTE — Telephone Encounter (Signed)
Ok to refill??  Last office visit 11/04/2020.  Last refill 05/26/2021.

## 2021-07-05 MED ORDER — AMPHETAMINE-DEXTROAMPHETAMINE 10 MG PO TABS: 10.0000 mg | ORAL_TABLET | Freq: Two times a day (BID) | ORAL | 0 refills | Status: DC

## 2021-08-08 ENCOUNTER — Encounter: Payer: Self-pay | Admitting: Family Medicine

## 2021-08-08 ENCOUNTER — Other Ambulatory Visit: Payer: Self-pay | Admitting: Family Medicine

## 2021-08-08 DIAGNOSIS — F909 Attention-deficit hyperactivity disorder, unspecified type: Secondary | ICD-10-CM

## 2021-08-08 MED ORDER — AMPHETAMINE-DEXTROAMPHETAMINE 10 MG PO TABS: 10.0000 mg | ORAL_TABLET | Freq: Two times a day (BID) | ORAL | 0 refills | Status: DC

## 2021-08-08 NOTE — Telephone Encounter (Signed)
LOV 11/04/20 Last refill 07/05/21  Please review. Thank you!

## 2021-09-02 ENCOUNTER — Encounter: Payer: Self-pay | Admitting: Family Medicine

## 2021-09-02 ENCOUNTER — Other Ambulatory Visit: Payer: Self-pay | Admitting: Family Medicine

## 2021-09-02 DIAGNOSIS — F909 Attention-deficit hyperactivity disorder, unspecified type: Secondary | ICD-10-CM

## 2021-09-02 MED ORDER — AMPHETAMINE-DEXTROAMPHETAMINE 10 MG PO TABS: 10.0000 mg | ORAL_TABLET | Freq: Two times a day (BID) | ORAL | 0 refills | Status: DC

## 2021-10-11 ENCOUNTER — Encounter: Payer: Self-pay | Admitting: Family Medicine

## 2021-10-11 ENCOUNTER — Other Ambulatory Visit: Payer: Self-pay | Admitting: Family Medicine

## 2021-10-11 DIAGNOSIS — F909 Attention-deficit hyperactivity disorder, unspecified type: Secondary | ICD-10-CM

## 2021-10-11 MED ORDER — AMPHETAMINE-DEXTROAMPHETAMINE 10 MG PO TABS: 10.0000 mg | ORAL_TABLET | Freq: Two times a day (BID) | ORAL | 0 refills | Status: DC

## 2021-10-27 ENCOUNTER — Other Ambulatory Visit: Payer: Self-pay | Admitting: Family Medicine

## 2021-11-03 ENCOUNTER — Other Ambulatory Visit: Payer: Self-pay

## 2021-11-03 DIAGNOSIS — D509 Iron deficiency anemia, unspecified: Secondary | ICD-10-CM | POA: Insufficient documentation

## 2021-11-03 DIAGNOSIS — Z860100 Personal history of colon polyps, unspecified: Secondary | ICD-10-CM | POA: Insufficient documentation

## 2021-11-03 DIAGNOSIS — Z0000 Encounter for general adult medical examination without abnormal findings: Secondary | ICD-10-CM

## 2021-11-03 DIAGNOSIS — E039 Hypothyroidism, unspecified: Secondary | ICD-10-CM

## 2021-11-03 DIAGNOSIS — K5730 Diverticulosis of large intestine without perforation or abscess without bleeding: Secondary | ICD-10-CM | POA: Insufficient documentation

## 2021-11-03 DIAGNOSIS — K2940 Chronic atrophic gastritis without bleeding: Secondary | ICD-10-CM | POA: Insufficient documentation

## 2021-11-03 DIAGNOSIS — K5904 Chronic idiopathic constipation: Secondary | ICD-10-CM | POA: Insufficient documentation

## 2021-11-03 DIAGNOSIS — Z136 Encounter for screening for cardiovascular disorders: Secondary | ICD-10-CM

## 2021-11-04 ENCOUNTER — Other Ambulatory Visit: Payer: Self-pay

## 2021-11-04 ENCOUNTER — Other Ambulatory Visit: Payer: Medicare Other

## 2021-11-04 DIAGNOSIS — E039 Hypothyroidism, unspecified: Secondary | ICD-10-CM

## 2021-11-04 DIAGNOSIS — Z0000 Encounter for general adult medical examination without abnormal findings: Secondary | ICD-10-CM

## 2021-11-04 DIAGNOSIS — D509 Iron deficiency anemia, unspecified: Secondary | ICD-10-CM

## 2021-11-04 DIAGNOSIS — Z136 Encounter for screening for cardiovascular disorders: Secondary | ICD-10-CM

## 2021-11-07 ENCOUNTER — Telehealth: Payer: Self-pay | Admitting: Family Medicine

## 2021-11-07 DIAGNOSIS — D509 Iron deficiency anemia, unspecified: Secondary | ICD-10-CM

## 2021-11-07 NOTE — Telephone Encounter (Signed)
Patient was looking over her recent lab results and noticed she is anemia again. She states in the past they ordered an additional test on her. She would like to know if that can be done and would like a return phone call.  CB# [phone]

## 2021-11-08 ENCOUNTER — Other Ambulatory Visit: Payer: Self-pay

## 2021-11-08 DIAGNOSIS — D508 Other iron deficiency anemias: Secondary | ICD-10-CM

## 2021-11-08 DIAGNOSIS — D509 Iron deficiency anemia, unspecified: Secondary | ICD-10-CM

## 2021-11-08 LAB — COMPREHENSIVE METABOLIC PANEL
AG Ratio: 1.9 (calc) (ref 1.0–2.5)
ALT: 24 U/L (ref 6–29)
AST: 24 U/L (ref 10–35)
Albumin: 4.3 g/dL (ref 3.6–5.1)
Alkaline phosphatase (APISO): 75 U/L (ref 37–153)
BUN: 16 mg/dL (ref 7–25)
CO2: 26 mmol/L (ref 20–32)
Calcium: 9.9 mg/dL (ref 8.6–10.4)
Chloride: 101 mmol/L (ref 98–110)
Creat: 0.61 mg/dL (ref 0.50–1.05)
Globulin: 2.3 g/dL (calc) (ref 1.9–3.7)
Glucose, Bld: 89 mg/dL (ref 65–99)
Potassium: 4.3 mmol/L (ref 3.5–5.3)
Sodium: 135 mmol/L (ref 135–146)
Total Bilirubin: 0.4 mg/dL (ref 0.2–1.2)
Total Protein: 6.6 g/dL (ref 6.1–8.1)

## 2021-11-08 LAB — CBC WITH DIFFERENTIAL/PLATELET
Absolute Monocytes: 552 cells/uL (ref 200–950)
Basophils Absolute: 68 cells/uL (ref 0–200)
Basophils Relative: 1.1 %
Eosinophils Absolute: 453 cells/uL (ref 15–500)
Eosinophils Relative: 7.3 %
HCT: 29.1 % — ABNORMAL LOW (ref 35.0–45.0)
Hemoglobin: 9 g/dL — ABNORMAL LOW (ref 11.7–15.5)
Lymphs Abs: 1302 cells/uL (ref 850–3900)
MCH: 25.9 pg — ABNORMAL LOW (ref 27.0–33.0)
MCHC: 30.9 g/dL — ABNORMAL LOW (ref 32.0–36.0)
MCV: 83.6 fL (ref 80.0–100.0)
MPV: 9.7 fL (ref 7.5–12.5)
Monocytes Relative: 8.9 %
Neutro Abs: 3825 cells/uL (ref 1500–7800)
Neutrophils Relative %: 61.7 %
Platelets: 654 10*3/uL — ABNORMAL HIGH (ref 140–400)
RBC: 3.48 10*6/uL — ABNORMAL LOW (ref 3.80–5.10)
RDW: 13.2 % (ref 11.0–15.0)
Total Lymphocyte: 21 %
WBC: 6.2 10*3/uL (ref 3.8–10.8)

## 2021-11-08 LAB — LIPID PANEL
Cholesterol: 150 mg/dL (ref <200)
HDL: 93 mg/dL (ref >=50)
LDL Cholesterol (Calc): 44 mg/dL (calc)
Non-HDL Cholesterol (Calc): 57 mg/dL (calc) (ref <130)
Total CHOL/HDL Ratio: 1.6 (calc) (ref <5.0)
Triglycerides: 51 mg/dL (ref <150)

## 2021-11-08 LAB — VITAMIN B12: Vitamin B-12: 619 pg/mL (ref 200–1100)

## 2021-11-08 LAB — TEST AUTHORIZATION

## 2021-11-08 LAB — TSH: TSH: 1.31 mIU/L (ref 0.40–4.50)

## 2021-11-08 LAB — FERRITIN: Ferritin: 3 ng/mL — ABNORMAL LOW (ref 16–288)

## 2021-11-08 NOTE — Telephone Encounter (Signed)
Significant drop in blood. Check stool for blood x 3, ferritin, and b12 level.    Pt aware, has picked up stool card. Labs have been added.

## 2021-11-09 ENCOUNTER — Encounter: Payer: Self-pay | Admitting: Family Medicine

## 2021-11-09 ENCOUNTER — Other Ambulatory Visit: Payer: Medicare Other

## 2021-11-09 DIAGNOSIS — F909 Attention-deficit hyperactivity disorder, unspecified type: Secondary | ICD-10-CM

## 2021-11-09 DIAGNOSIS — D508 Other iron deficiency anemias: Secondary | ICD-10-CM

## 2021-11-09 DIAGNOSIS — D509 Iron deficiency anemia, unspecified: Secondary | ICD-10-CM

## 2021-11-10 ENCOUNTER — Ambulatory Visit (INDEPENDENT_AMBULATORY_CARE_PROVIDER_SITE_OTHER): Payer: Medicare Other | Admitting: Family Medicine

## 2021-11-10 ENCOUNTER — Inpatient Hospital Stay: Payer: TRICARE For Life (TFL) | Admitting: Adult Health

## 2021-11-10 ENCOUNTER — Encounter: Payer: Self-pay | Admitting: Family Medicine

## 2021-11-10 ENCOUNTER — Other Ambulatory Visit: Payer: Self-pay

## 2021-11-10 ENCOUNTER — Telehealth: Payer: Self-pay | Admitting: Gastroenterology

## 2021-11-10 VITALS — BP 152/82 | HR 82 | Temp 97.9°F | Resp 18 | Ht 65.0 in | Wt 122.0 lb

## 2021-11-10 DIAGNOSIS — Z0001 Encounter for general adult medical examination with abnormal findings: Secondary | ICD-10-CM

## 2021-11-10 DIAGNOSIS — Z0000 Encounter for general adult medical examination without abnormal findings: Secondary | ICD-10-CM

## 2021-11-10 DIAGNOSIS — D509 Iron deficiency anemia, unspecified: Secondary | ICD-10-CM

## 2021-11-10 DIAGNOSIS — Z1231 Encounter for screening mammogram for malignant neoplasm of breast: Secondary | ICD-10-CM

## 2021-11-10 DIAGNOSIS — I2510 Atherosclerotic heart disease of native coronary artery without angina pectoris: Secondary | ICD-10-CM | POA: Diagnosis not present

## 2021-11-10 DIAGNOSIS — F17200 Nicotine dependence, unspecified, uncomplicated: Secondary | ICD-10-CM

## 2021-11-10 LAB — FECAL GLOBIN BY IMMUNOCHEMISTRY
FECAL GLOBIN RESULT:: DETECTED — AB
MICRO NUMBER:: 12981038
SPECIMEN QUALITY:: ADEQUATE

## 2021-11-10 MED ORDER — AMPHETAMINE-DEXTROAMPHETAMINE 10 MG PO TABS: 10.0000 mg | ORAL_TABLET | Freq: Two times a day (BID) | ORAL | 0 refills | Status: DC

## 2021-11-10 NOTE — Progress Notes (Signed)
Subjective:    Patient ID: [name], female    DOB: [birth year], 66 y.o.   MRN: [id number]  HPI  Last colonoscopy was 9/22 (findins listed below) Decreased sphincter tone and hemorrhoids found on digital rectal exam. - Grossly redundant colon. - Eight, 2 to 8 mm polyps at the recto-sigmoid colon, in the transverse colon, in the ascending colon and in the cecum, removed with a cold snare. Resected and retrieved. - Post mucosectomy scar in the proximal ascending colon. - Post mucosectomy scar in the mid ascending colon with evidence of some likely recurrence in central scar. Mucosal resection via snare and Avulsion was performed. - Normal mucosa in the entire examined colon otherwise. - Non-bleeding non-thrombosed external and internal hemorrhoids.  Last EGD 2021- - Normal appearing, widely patent esophagus and GEJ. - Nodular mucosa in proximal stomach-chronic gastritis-biopsies done. - Erythematous duodenopathy with edematous folds in the duodenal bulb and post-bulbar region-biopsies done. Patient is a 66 year old Caucasian female here today for complete physical exam.  Of note on her recent lab work, her hemoglobin has dropped precipitously to 9.  Her ferritin is extremely low at 3.  B12 levels normal.  Fecal occult blood test was positive.  As shown above, she has had numerous endoscopic procedures recently.  Therefore I do not feel that malignancy is the cause of the drop however it is possible that she could have gastritis causing the blood loss.  She is started ferrous sulfate 325 mg daily.  She does report fatigue.  She is due for her mammogram.  Due to her age she does not require Pap smear.  She declines a bone density test.  Her immunizations are up-to-date as shown below: Lab on 11/09/2021  Component Date Value Ref Range Status   MICRO NUMBER: 11/09/2021 [id number]   Final   SPECIMEN QUALITY: 11/09/2021 Adequate   Final   Source: 11/09/2021 NOT GIVEN   Final   STATUS:  11/09/2021 FINAL   Final   FECAL GLOBIN RESULT: 11/09/2021 Detected (A)   Final   Detected  Lab on 11/04/2021  Component Date Value Ref Range Status   Glucose, Bld 11/04/2021 89  65 - 99 mg/dL Final   Comment: .            Fasting reference interval .    BUN 11/04/2021 16  7 - 25 mg/dL Final   Creat 11/04/2021 0.61  0.50 - 1.05 mg/dL Final   BUN/Creatinine Ratio 29/56/2130 NOT APPLICABLE  6 - 22 (calc) Final   Sodium 11/04/2021 135  135 - 146 mmol/L Final   Potassium 11/04/2021 4.3  3.5 - 5.3 mmol/L Final   Chloride 11/04/2021 101  98 - 110 mmol/L Final   CO2 11/04/2021 26  20 - 32 mmol/L Final   Calcium 11/04/2021 9.9  8.6 - 10.4 mg/dL Final   Total Protein 11/04/2021 6.6  6.1 - 8.1 g/dL Final   Albumin 11/04/2021 4.3  3.6 - 5.1 g/dL Final   Globulin 11/04/2021 2.3  1.9 - 3.7 g/dL (calc) Final   AG Ratio 11/04/2021 1.9  1.0 - 2.5 (calc) Final   Total Bilirubin 11/04/2021 0.4  0.2 - 1.2 mg/dL Final   Alkaline phosphatase (APISO) 11/04/2021 75  37 - 153 U/L Final   AST 11/04/2021 24  10 - 35 U/L Final   ALT 11/04/2021 24  6 - 29 U/L Final   WBC 11/04/2021 6.2  3.8 - 10.8 Thousand/uL Final   RBC 11/04/2021 3.48 (L)  3.80 -  5.10 Million/uL Final   Hemoglobin 11/04/2021 9.0 (L)  11.7 - 15.5 g/dL Final   HCT 11/04/2021 29.1 (L)  35.0 - 45.0 % Final   MCV 11/04/2021 83.6  80.0 - 100.0 fL Final   MCH 11/04/2021 25.9 (L)  27.0 - 33.0 pg Final   MCHC 11/04/2021 30.9 (L)  32.0 - 36.0 g/dL Final   RDW 11/04/2021 13.2  11.0 - 15.0 % Final   Platelets 11/04/2021 654 (H)  140 - 400 Thousand/uL Final   MPV 11/04/2021 9.7  7.5 - 12.5 fL Final   Neutro Abs 11/04/2021 3,825  1,500 - 7,800 cells/uL Final   Lymphs Abs 11/04/2021 1,302  850 - 3,900 cells/uL Final   Absolute Monocytes 11/04/2021 552  200 - 950 cells/uL Final   Eosinophils Absolute 11/04/2021 453  15 - 500 cells/uL Final   Basophils Absolute 11/04/2021 68  0 - 200 cells/uL Final   Neutrophils Relative % 11/04/2021 61.7  % Final    Total Lymphocyte 11/04/2021 21.0  % Final   Monocytes Relative 11/04/2021 8.9  % Final   Eosinophils Relative 11/04/2021 7.3  % Final   Basophils Relative 11/04/2021 1.1  % Final   Cholesterol 11/04/2021 150  <200 mg/dL Final   HDL 11/04/2021 93  > OR = 50 mg/dL Final   Triglycerides 11/04/2021 51  <150 mg/dL Final   LDL Cholesterol (Calc) 11/04/2021 44  mg/dL (calc) Final   Comment: Reference range: <100 . Desirable range <100 mg/dL for primary prevention;   <70 mg/dL for patients with CHD or diabetic patients  with > or = 2 CHD risk factors. Marland Kitchen LDL-C is now calculated using the Martin-Hopkins  calculation, which is a validated novel method providing  better accuracy than the Friedewald equation in the  estimation of LDL-C.  Cresenciano Genre et al. Annamaria Helling. 3149;702(63): 2061-2068  (http://education.[facility].com/faq/FAQ164)    Total CHOL/HDL Ratio 11/04/2021 1.6  <5.0 (calc) Final   Non-HDL Cholesterol (Calc) 11/04/2021 57  <130 mg/dL (calc) Final   Comment: For patients with diabetes plus 1 major ASCVD risk  factor, treating to a non-HDL-C goal of <100 mg/dL  (LDL-C of <70 mg/dL) is considered a therapeutic  option.    TSH 11/04/2021 1.31  0.40 - 4.50 mIU/L Final   Ferritin 11/04/2021 3 (L)  16 - 288 ng/mL Final   Vitamin B-12 11/04/2021 619  200 - 1,100 pg/mL Final   TEST NAME: 11/04/2021 FERRITIN VITAMIN B12   Final   TEST CODE: 11/04/2021 [id number]   Final   CLIENT CONTACT: 11/04/2021 Learta Codding   Final   REPORT ALWAYS MESSAGE SIGNATURE 11/04/2021    Final   Comment: . The laboratory testing on this patient was verbally requested or confirmed by the ordering physician or his or her authorized representative after contact with an employee of [employer]. Federal regulations require that we maintain on file written authorization for all laboratory testing.  Accordingly we are asking that the ordering physician or his or her authorized representative sign a copy of  this report and promptly return it to the client service representative. . . Signature:____________________________________________________ . Please fax this signed page to [phone] or return it via your [employer] courier.    Immunization History  Administered Date(s) Administered   Influenza,inj,Quad PF,6+ Mos 07/20/2014, 09/03/2015, 10/23/2019   Influenza-Unspecified 10/08/2013, 08/16/2016, 08/07/2017, 08/07/2018   PFIZER Comirnaty(Gray Top)Covid-19 Tri-Sucrose Vaccine 12/24/2019, 01/14/2020, 06/30/2020   Pneumococcal Conjugate-13 09/04/2016   Pneumococcal Polysaccharide-23 09/05/2012, 10/21/2018   Zoster Recombinat (Shingrix)  06/19/2017, 10/26/2017    Past Medical History:  Diagnosis Date   ADHD (attention deficit hyperactivity disorder) 09/07/2011   Anesthesia complication 45/40/9811   Will need GETA for future endoscopy procedures. Did not tolerate MAC well.   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   Diverticulosis    DVT (deep venous thrombosis) ([place]) 09/07/2011   Hypertension    on no medications   Hypothyroid 09/07/2011   Internal hemorrhoids    Iron deficiency anemia    Personal history of radiation therapy 2012   Pneumonia    Smoker 02/17/2013   Tubular adenoma of colon    Past Surgical History:  Procedure Laterality Date   ABDOMINAL HYSTERECTOMY     BIOPSY  12/04/2019   Procedure: BIOPSY;  Surgeon: Juanita Craver, MD;  Location: [facility];  Service: Endoscopy;;   BIOPSY  06/14/2021   Procedure: BIOPSY;  Surgeon: Irving Copas., MD;  Location: [facility];  Service: Gastroenterology;;   BREAST LUMPECTOMY  March 2013   Left breast   COLONOSCOPY     COLONOSCOPY WITH PROPOFOL N/A 12/04/2019   Procedure: COLONOSCOPY WITH PROPOFOL;  Surgeon: Juanita Craver, MD;  Location: [facility];   Service: Endoscopy;  Laterality: N/A;   COLONOSCOPY WITH PROPOFOL N/A 09/20/2020   Procedure: COLONOSCOPY WITH PROPOFOL;  Surgeon: Rush Landmark Telford Nab., MD;  Location: [place];  Service: Gastroenterology;  Laterality: N/A;   COLONOSCOPY WITH PROPOFOL N/A 06/14/2021   Procedure: COLONOSCOPY WITH PROPOFOL;  Surgeon: Rush Landmark Telford Nab., MD;  Location: [facility];  Service: Gastroenterology;  Laterality: N/A;   ENDOSCOPIC MUCOSAL RESECTION N/A 09/20/2020   Procedure: ENDOSCOPIC MUCOSAL RESECTION;  Surgeon: Rush Landmark Telford Nab., MD;  Location: [place];  Service: Gastroenterology;  Laterality: N/A;   ESOPHAGOGASTRODUODENOSCOPY (EGD) WITH PROPOFOL N/A 12/04/2019   Procedure: ESOPHAGOGASTRODUODENOSCOPY (EGD) WITH PROPOFOL;  Surgeon: Juanita Craver, MD;  Location: [facility];  Service: Endoscopy;  Laterality: N/A;   EYE SURGERY Right Sept. 2014   Lasix - Laser   HEMOSTASIS CLIP PLACEMENT  09/20/2020   Procedure: HEMOSTASIS CLIP PLACEMENT;  Surgeon: Irving Copas., MD;  Location: [place];  Service: Gastroenterology;;   POLYPECTOMY  12/04/2019   Procedure: POLYPECTOMY;  Surgeon: Juanita Craver, MD;  Location: [facility];  Service: Endoscopy;;   POLYPECTOMY  09/20/2020   Procedure: POLYPECTOMY;  Surgeon: Irving Copas., MD;  Location: [place];  Service: Gastroenterology;;   POLYPECTOMY  06/14/2021   Procedure: POLYPECTOMY;  Surgeon: Irving Copas., MD;  Location: [facility];  Service: Gastroenterology;;   SUBMUCOSAL LIFTING INJECTION  09/20/2020   Procedure: SUBMUCOSAL LIFTING INJECTION;  Surgeon: Irving Copas., MD;  Location: [place];  Service: Gastroenterology;;   UPPER GASTROINTESTINAL ENDOSCOPY     Current Outpatient Medications on File Prior to Visit  Medication Sig Dispense Refill   acetaminophen (TYLENOL) 650 MG CR tablet Take 650 mg by mouth every 8 (eight) hours as needed for pain.     [START ON 12/07/2021]  amphetamine-dextroamphetamine (ADDERALL) 10 MG tablet Take 1 tablet (10 mg total) by mouth 2 (two) times daily. 60 tablet 0   [START ON 01/07/2022] amphetamine-dextroamphetamine (ADDERALL) 10 MG tablet Take 1 tablet (10 mg total) by mouth 2 (two) times daily. 60 tablet 0   amphetamine-dextroamphetamine (ADDERALL) 10 MG tablet Take 1 tablet (10 mg total) by mouth 2 (  two) times daily. 60 tablet 0   B Complex-C (B-COMPLEX WITH VITAMIN C) tablet Take 1 tablet by mouth daily.     calcium carbonate (TUMS - DOSED IN MG ELEMENTAL CALCIUM) 500 MG chewable tablet Chew 1 tablet by mouth 3 (three) times daily as needed for indigestion or heartburn.     cholecalciferol (VITAMIN D) 1000 UNITS tablet Take 1,000 Units by mouth daily.     levothyroxine (SYNTHROID) 75 MCG tablet TAKE 1 TABLET DAILY BEFORE BREAKFAST (Patient taking differently: Take 75 mcg by mouth daily before breakfast.) 90 tablet 3   Multiple Vitamin (MULTIVITAMIN WITH MINERALS) TABS tablet Take 1 tablet by mouth daily. Centrum Silver     naproxen (NAPROSYN) 375 MG tablet TAKE 1 TABLET TWICE A DAY WITH MEALS 180 tablet 3   rosuvastatin (CRESTOR) 20 MG tablet TAKE 1 TABLET DAILY 90 tablet 0   No current facility-administered medications on file prior to visit.   Allergies  Allergen Reactions   Penicillins Hives    Did it involve swelling of the face/tongue/throat, SOB, or low BP? No Did it involve sudden or severe rash/hives, skin peeling, or any reaction on the inside of your mouth or nose? No Did you need to seek medical attention at a hospital or doctor's office? Yes When did it last happen?      35 years If all above answers are NO, may proceed with cephalosporin use.   Social History   Socioeconomic History   Marital status: Divorced    Spouse name: Not on file   Number of children: Not on file   Years of education: Not on file   Highest education level: Not on file  Occupational History   Not on file  Tobacco Use   Smoking  status: Every Day    Packs/day: 1.00    Years: 40.00    Pack years: 40.00    Types: E-cigarettes, Cigarettes   Smokeless tobacco: Never  Vaping Use   Vaping Use: Never used  Substance and Sexual Activity   Alcohol use: Yes    Alcohol/week: 0.0 standard drinks    Comment: 4 mixed drinks per week    Drug use: No   Sexual activity: Never  Other Topics Concern   Not on file  Social History Narrative   Not on file   Social Determinants of Health   Financial Resource Strain: Not on file  Food Insecurity: Not on file  Transportation Needs: Not on file  Physical Activity: Not on file  Stress: Not on file  Social Connections: Not on file  Intimate Partner Violence: Not on file   Family History  Problem Relation Age of Onset   Stomach cancer Father    Esophageal cancer Father    Crohn's disease Daughter    Ulcerative colitis Daughter    Diabetes Maternal Grandmother    Colon cancer Neg Hx    Pancreatic cancer Neg Hx    Liver disease Neg Hx       Review of Systems  All other systems reviewed and are negative.     Objective:   Physical Exam Vitals reviewed.  Constitutional:      General: She is not in acute distress.    Appearance: She is well-developed. She is not diaphoretic.  HENT:     Head: Normocephalic and atraumatic.     Right Ear: External ear normal.     Left Ear: External ear normal.     Nose: Nose normal.     Mouth/Throat:  Pharynx: No oropharyngeal exudate.  Eyes:     General: No scleral icterus.       Right eye: No discharge.        Left eye: No discharge.     Conjunctiva/sclera: Conjunctivae normal.     Pupils: Pupils are equal, round, and reactive to light.  Neck:     Thyroid: No thyromegaly.     Vascular: No JVD.     Trachea: No tracheal deviation.  Cardiovascular:     Rate and Rhythm: Normal rate and regular rhythm.     Heart sounds: Normal heart sounds. No murmur heard.   No friction rub. No gallop.  Pulmonary:     Effort: Pulmonary  effort is normal. No respiratory distress.     Breath sounds: Normal breath sounds. No stridor. No wheezing or rales.  Chest:     Chest wall: No tenderness.  Abdominal:     General: Bowel sounds are normal. There is no distension.     Palpations: Abdomen is soft. There is no mass.     Tenderness: There is no abdominal tenderness. There is no guarding or rebound.  Musculoskeletal:        General: No tenderness. Normal range of motion.     Cervical back: Normal range of motion and neck supple.  Lymphadenopathy:     Cervical: No cervical adenopathy.  Skin:    General: Skin is warm.     Coloration: Skin is not pale.     Findings: No erythema or rash.  Neurological:     Mental Status: She is alert and oriented to person, place, and time.     Cranial Nerves: No cranial nerve deficit.     Motor: No abnormal muscle tone.     Coordination: Coordination normal.     Deep Tendon Reflexes: Reflexes are normal and symmetric.  Psychiatric:        Behavior: Behavior normal.        Thought Content: Thought content normal.        Judgment: Judgment normal.          Assessment & Plan:  Encounter for screening mammogram for malignant neoplasm of breast - Plan: MM Digital Screening  Iron deficiency anemia, unspecified iron deficiency anemia type  ASCVD (arteriosclerotic cardiovascular disease)  Annual physical exam  Smoker Patient continues to smoke.  I recommended smoking cessation.  Her last CT scan of the chest was in September and is not due again until this September.  I will schedule the patient for mammogram.  Patient follows up regularly with GI.  Her colonoscopy is up-to-date.  However given the fecal occult blood positive test result this morning I have recommended that she follow-up with GI that she may benefit from an EGD to rule out bleeding from her stomach as a contributing factor to the drop in her hemoglobin.  Meanwhile take ferrous sulfate 325 mg daily and recheck CBC in 3  weeks.  Recommended bone density but she declined that at the present time.  Immunizations are up-to-date.  Blood pressure today is very high.  Patient will check her blood pressure at home and notify me of the values in 1 week

## 2021-11-10 NOTE — Telephone Encounter (Signed)
I spoke with the pt and provided her with Dr Donneta Romberg recommendations  She prefers to come in the office and discuss her options.  I have scheduled her for 11/16/21 at 3:50 pm.

## 2021-11-10 NOTE — Telephone Encounter (Signed)
Recommend that patient and I meet.  We may need to perform an enteroscopy and a colonoscopy sooner than I had wanted but we can certainly do that.  If we do this and we do not find an overt source for her iron deficiency, then she will need a capsule endoscopy.  I agree with her PCP wanting her to initiate oral iron and repeat labs in 4 to 6 weeks. If she wants to see [place] in clinic we can set her up with my next available and use a 3:50 PM slot or a held slot if needed. Otherwise if she is in agreement with this plan, then schedule her for enteroscopy/colonoscopy 2-hour slot because I may need to do Endorotor for her colon in the hospital-based setting. Thanks. GM

## 2021-11-10 NOTE — Telephone Encounter (Signed)
Inbound call from patient states she went to her PCP and had lab work showed her iron was low and blood in stool. States her PCP advised her to call Dr. Rush Landmark to review and discuss next plan for care. Offered appt but patient wanted to see what Dr. Rush Landmark says first

## 2021-11-14 ENCOUNTER — Encounter: Payer: TRICARE For Life (TFL) | Admitting: Family Medicine

## 2021-11-14 ENCOUNTER — Ambulatory Visit
Admission: RE | Admit: 2021-11-14 | Discharge: 2021-11-14 | Disposition: A | Discharge status: Home / Self Care | Payer: Medicare Other | Source: Ambulatory Visit | Attending: Family Medicine | Admitting: Family Medicine

## 2021-11-14 ENCOUNTER — Other Ambulatory Visit: Payer: Self-pay

## 2021-11-14 DIAGNOSIS — Z1231 Encounter for screening mammogram for malignant neoplasm of breast: Secondary | ICD-10-CM

## 2021-11-16 ENCOUNTER — Ambulatory Visit (INDEPENDENT_AMBULATORY_CARE_PROVIDER_SITE_OTHER): Payer: Medicare Other | Admitting: Gastroenterology

## 2021-11-16 ENCOUNTER — Encounter: Payer: Self-pay | Admitting: Gastroenterology

## 2021-11-16 VITALS — BP 142/88 | HR 83 | Ht 65.0 in | Wt 122.6 lb

## 2021-11-16 DIAGNOSIS — Z86010 Personal history of colonic polyps: Secondary | ICD-10-CM | POA: Diagnosis not present

## 2021-11-16 DIAGNOSIS — D509 Iron deficiency anemia, unspecified: Secondary | ICD-10-CM | POA: Diagnosis not present

## 2021-11-16 DIAGNOSIS — R195 Other fecal abnormalities: Secondary | ICD-10-CM | POA: Diagnosis not present

## 2021-11-16 NOTE — Patient Instructions (Signed)
You have been scheduled for an endoscopy. Please follow written instructions given to you at your visit today. If you use inhalers (even only as needed), please bring them with you on the day of your procedure.  If you are age 66 or older, your body mass index should be between 23-30. Your Body mass index is 20.4 kg/m. If this is out of the aforementioned range listed, please consider follow up with your Primary Care Provider.  If you are age 57 or younger, your body mass index should be between 19-25. Your Body mass index is 20.4 kg/m. If this is out of the aformentioned range listed, please consider follow up with your Primary Care Provider.   ________________________________________________________  The [place] [facility] providers would like to encourage you to use [facility] to communicate with providers for non-urgent requests or questions.  Due to long hold times on the telephone, sending your provider a message by [facility] may be a faster and more efficient way to get a response.  Please allow 48 business hours for a response.  Please remember that this is for non-urgent requests.  _______________________________________________________  Thank you for choosing me and [facility].  Dr. Rush Landmark

## 2021-11-16 NOTE — Progress Notes (Signed)
[place] VISIT   Primary Care Provider [name], MD [address] [place] [address] [phone]  Patient Profile: [name] is a 66 y.o. female with a pmh significant for ADHD, CAD, Breast DCIS, prior VTE, hypothyroidism, prior IDA, hemorrhoids, colon polyps (TA's status post piecemeal resections).  The patient presents to the [facility] for an evaluation and management of problem(s) noted below:  Problem List 1. Iron deficiency anemia, unspecified iron deficiency anemia type   2. Positive FIT (fecal immunochemical test)   3. History of colonic polyps     History of Present Illness Please see prior notes from Dr. Tarri Glenn for full details of HPI.  Interval History The patient presents for further evaluation in the setting of recurrent finding of iron deficiency anemia.  She is a patient of Dr. Tarri Glenn but Dr. Tarri Glenn is out of town and I have taken care of a few of her large colon polyps, so she was scheduled with me.  The patient also underwent FIT testing that returned positive.  She has been restarted on oral iron.  She is taking it daily.  She has not noted any changes in her bowel habits or any blood in her stools.  Her stools a little bit darker, but only after the initiation of iron.  She has not had any new abdominal pain or discomfort.  She denies any dysphagia symptoms.  She had felt a little more fatigued than normal but thought it was just a result of the winter.  If her blood had not been checked, she would not know when she was anemic.  GI Review of Systems Positive as above Negative for nausea, vomiting, odynophagia, alteration of bowel habits  Review of Systems General: Denies fevers/chills/weight loss unintentionally HEENT: Denies oral lesions Cardiovascular: Denies chest pain Pulmonary: Denies shortness of breath Gastroenterological: See HPI Genitourinary: Denies darkened urine or  hematuria Hematological: Denies easy bruising/bleeding Dermatological: Denies jaundice Psychological: Mood is stable   Medications Current Outpatient Medications  Medication Sig Dispense Refill   acetaminophen (TYLENOL) 650 MG CR tablet Take 650 mg by mouth every 8 (eight) hours as needed for pain.     [START ON 12/07/2021] amphetamine-dextroamphetamine (ADDERALL) 10 MG tablet Take 1 tablet (10 mg total) by mouth 2 (two) times daily. 60 tablet 0   [START ON 01/07/2022] amphetamine-dextroamphetamine (ADDERALL) 10 MG tablet Take 1 tablet (10 mg total) by mouth 2 (two) times daily. 60 tablet 0   amphetamine-dextroamphetamine (ADDERALL) 10 MG tablet Take 1 tablet (10 mg total) by mouth 2 (two) times daily. 60 tablet 0   B Complex-C (B-COMPLEX WITH VITAMIN C) tablet Take 1 tablet by mouth daily.     bisacodyl (DULCOLAX) 5 MG EC tablet Take 5 mg by mouth in the morning.     calcium carbonate (TUMS - DOSED IN MG ELEMENTAL CALCIUM) 500 MG chewable tablet Chew 1 tablet by mouth 3 (three) times daily as needed for indigestion or heartburn.     cholecalciferol (VITAMIN D) 1000 UNITS tablet Take 1,000 Units by mouth daily.     ferrous sulfate 325 (65 FE) MG tablet Take 325 mg by mouth daily with breakfast.     levothyroxine (SYNTHROID) 75 MCG tablet TAKE 1 TABLET DAILY BEFORE BREAKFAST (Patient taking differently: Take 75 mcg by mouth daily before breakfast.) 90 tablet 3   Multiple Vitamin (MULTIVITAMIN WITH MINERALS) TABS tablet Take 1 tablet by mouth daily. Centrum Silver     naproxen (  NAPROSYN) 375 MG tablet TAKE 1 TABLET TWICE A DAY WITH MEALS 180 tablet 3   polycarbophil (FIBERCON) 625 MG tablet Take 625 mg by mouth daily.     rosuvastatin (CRESTOR) 20 MG tablet TAKE 1 TABLET DAILY 90 tablet 0   No current facility-administered medications for this visit.    Allergies Allergies  Allergen Reactions   Penicillins Hives    Did it involve swelling of the face/tongue/throat, SOB, or low BP? No Did it  involve sudden or severe rash/hives, skin peeling, or any reaction on the inside of your mouth or nose? No Did you need to seek medical attention at a hospital or doctor's office? Yes When did it last happen?      35 years If all above answers are NO, may proceed with cephalosporin use.    Histories Past Medical History:  Diagnosis Date   ADHD (attention deficit hyperactivity disorder) 09/07/2011   Anesthesia complication 82/95/6213   Will need GETA for future endoscopy procedures. Did not tolerate MAC well.   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   Diverticulosis    DVT (deep venous thrombosis) ([place]) 09/07/2011   Hypertension    on no medications   Hypothyroid 09/07/2011   Internal hemorrhoids    Iron deficiency anemia    Personal history of radiation therapy 2012   Pneumonia    Smoker 02/17/2013   Tubular adenoma of colon    Past Surgical History:  Procedure Laterality Date   ABDOMINAL HYSTERECTOMY     BIOPSY  12/04/2019   Procedure: BIOPSY;  Surgeon: Juanita Craver, MD;  Location: [facility];  Service: Endoscopy;;   BIOPSY  06/14/2021   Procedure: BIOPSY;  Surgeon: Irving Copas., MD;  Location: [facility];  Service: Gastroenterology;;   BREAST BIOPSY Left 2013   BREAST LUMPECTOMY  12/2011   Left breast   COLONOSCOPY     COLONOSCOPY WITH PROPOFOL N/A 12/04/2019   Procedure: COLONOSCOPY WITH PROPOFOL;  Surgeon: Juanita Craver, MD;  Location: [facility];  Service: Endoscopy;  Laterality: N/A;   COLONOSCOPY WITH PROPOFOL N/A 09/20/2020   Procedure: COLONOSCOPY WITH PROPOFOL;  Surgeon: Rush Landmark Telford Nab., MD;  Location: [place];  Service: Gastroenterology;  Laterality: N/A;   COLONOSCOPY WITH PROPOFOL N/A 06/14/2021   Procedure: COLONOSCOPY WITH PROPOFOL;  Surgeon: Rush Landmark Telford Nab., MD;   [facility];  Service: Gastroenterology;  Laterality: N/A;   ENDOSCOPIC MUCOSAL RESECTION N/A 09/20/2020   Procedure: ENDOSCOPIC MUCOSAL RESECTION;  Surgeon: Rush Landmark Telford Nab., MD;  Location: [place];  Service: Gastroenterology;  Laterality: N/A;   ESOPHAGOGASTRODUODENOSCOPY (EGD) WITH PROPOFOL N/A 12/04/2019   Procedure: ESOPHAGOGASTRODUODENOSCOPY (EGD) WITH PROPOFOL;  Surgeon: Juanita Craver, MD;  [facility];  Service: Endoscopy;  Laterality: N/A;   EYE SURGERY Right 06/2013   Lasix - Laser   HEMOSTASIS CLIP PLACEMENT  09/20/2020   Procedure: HEMOSTASIS CLIP PLACEMENT;  Surgeon: Irving Copas., MD;  Location: [place];  Service: Gastroenterology;;   POLYPECTOMY  12/04/2019   Procedure: POLYPECTOMY;  Surgeon: Juanita Craver, MD;  [facility]: [facility];  Service: Endoscopy;;   POLYPECTOMY  09/20/2020   Procedure: POLYPECTOMY;  Surgeon: Irving Copas., MD;  Location: [place];  Service: Gastroenterology;;   POLYPECTOMY  06/14/2021   Procedure: POLYPECTOMY;  Surgeon: Irving Copas., MD;  Location: [facility];  Service: Gastroenterology;;   SUBMUCOSAL LIFTING INJECTION  09/20/2020   Procedure: SUBMUCOSAL LIFTING INJECTION;  Surgeon: Irving Copas., MD;  Location: [facility];  Service: Gastroenterology;;   UPPER GASTROINTESTINAL ENDOSCOPY     Social History   Socioeconomic History   Marital status: Divorced    Spouse name: Not on file   Number of children: Not on file   Years of education: Not on file   Highest education level: Not on file  Occupational History   Not on file  Tobacco Use   Smoking status: Every Day    Packs/day: 1.00    Years: 40.00    Pack years: 40.00    Types: E-cigarettes, Cigarettes   Smokeless tobacco: Never  Vaping Use   Vaping Use: Never used  Substance and Sexual Activity   Alcohol use: Yes    Alcohol/week: 0.0 standard drinks    Comment: 4 mixed drinks per week    Drug use:  No   Sexual activity: Never  Other Topics Concern   Not on file  Social History Narrative   Not on file   Social Determinants of Health   Financial Resource Strain: Not on file  Food Insecurity: Not on file  Transportation Needs: Not on file  Physical Activity: Not on file  Stress: Not on file  Social Connections: Not on file  Intimate Partner Violence: Not on file   Family History  Problem Relation Age of Onset   Stomach cancer Father    Esophageal cancer Father    Diabetes Maternal Grandmother    Crohn's disease Daughter    Ulcerative colitis Daughter    Colon cancer Neg Hx    Pancreatic cancer Neg Hx    Liver disease Neg Hx    Inflammatory bowel disease Neg Hx    Rectal cancer Neg Hx    I have reviewed her medical, social, and family history in detail and updated the electronic medical record as necessary.    PHYSICAL EXAMINATION  BP (!) 142/88    Pulse 83    Ht _0  (1.651 m)    Wt 122 lb 9.6 oz (55.6 kg)    SpO2 99%    BMI 20.40 kg/m  Wt Readings from Last 3 Encounters:  11/16/21 122 lb 9.6 oz (55.6 kg)  11/10/21 122 lb (55.3 kg)  06/14/21 121 lb 14.6 oz (55.3 kg)  GEN: NAD, appears stated age, doesn't appear chronically ill PSYCH: Cooperative, without pressured speech EYE: Conjunctivae pink, sclerae anicteric ENT: MMM CV: Nontachycardic RESP: No audible wheezing GI: NABS, soft, NT/ND, without rebound or guarding MSK/EXT: No lower extremity edema SKIN: No jaundice NEURO:  Alert & Oriented x 3, no focal deficits   REVIEW OF DATA  I reviewed the following data at the time of this encounter:  GI Procedures and Studies  September 2022 colonoscopy - Decreased sphincter tone and hemorrhoids found on digital rectal exam. - Grossly redundant colon. - Eight, 2 to 8 mm polyps at the recto-sigmoid colon, in the transverse colon, in the ascending colon and in the cecum, removed with a cold snare. Resected and retrieved. - Post mucosectomy scar in the proximal  ascending colon. - Post mucosectomy scar in the mid ascending colon with evidence of some likely recurrence in central scar. Mucosal resection via snare and Avulsion was performed. - Normal mucosa in the entire examined colon otherwise. - Non-bleeding non-thrombosed external and internal hemorrhoids.  March 2021 EGD - Normal appearing, widely patent esophagus and GEJ. -  Nodular mucosa in proximal stomach-chronic gastritis-biopsies done. - Erythematous duodenopathy with edematous folds in the duodenal bulb and post-bulbar region-biopsies done.  Laboratory Studies  Reviewed those in epic  Imaging Studies  No new imaging studies to review   ASSESSMENT  Ms. Finelli is a 66 y.o. female with a pmh significant for ADHD, CAD, Breast DCIS, prior VTE, hypothyroidism, prior IDA, hemorrhoids, colon polyps (TA's status post piecemeal resections).  The patient is seen today for evaluation and management of:  1. Iron deficiency anemia, unspecified iron deficiency anemia type   2. Positive FIT (fecal immunochemical test)   3. History of colonic polyps    The patient is hemodynamically stable.  Clinically she is doing okay and other than some mild fatigue and decreased energy she really did not know that she was anemic.  She has a FIT positive iron deficiency.  The findings on her last colonoscopy did show some recurrence at the site of her large polyp resection, but should not be a reason for such a development of iron deficiency and anemia.  I think repeating an upper endoscopy is reasonable and consideration of a video capsule endoscopy as well.  If we perform 1 or both of these studies and we do not find a source/etiology for the significant iron deficiency, then her colonoscopy will need to be reperformed earlier than anticipated.  We will move forward with scheduling her EGD next available.  She will continue oral iron supplementation for now.  She may benefit from intravenous iron depending on how things  look in the next few weeks when iron studies are repeated.  The risks and benefits of endoscopic evaluation were discussed with the patient; these include but are not limited to the risk of perforation, infection, bleeding, missed lesions, lack of diagnosis, severe illness requiring hospitalization, as well as anesthesia and sedation related illnesses.  The patient and/or family is agreeable to proceed.   All patient questions were answered to the best of my ability, and the patient agrees to the aforementioned plan of action with follow-up as indicated.   PLAN  Proceed with scheduling EGD for follow-up of IDA and positive FIT If negative EGD findings, then will consider video capsule endoscopy If both of these are negative then repeat colonoscopy is then recommended (even though it would not be time for her surveillance yet)   Orders Placed This Encounter  Procedures   Ambulatory referral to Gastroenterology    New Prescriptions   No medications on file   Modified Medications   No medications on file    Planned Follow Up No follow-ups on file.   Total Time in Face-to-Face and in Coordination of Care for patient including independent/personal interpretation/review of prior testing, medical history, examination, medication adjustment, communicating results with the patient directly, and documentation within the EHR is 25 minutes.   Justice Britain, MD [facility] Office # [phone]

## 2021-11-18 ENCOUNTER — Encounter: Payer: Self-pay | Admitting: Gastroenterology

## 2021-11-18 ENCOUNTER — Ambulatory Visit (AMBULATORY_SURGERY_CENTER): Payer: Medicare Other | Admitting: Gastroenterology

## 2021-11-18 VITALS — BP 154/73 | HR 71 | Temp 97.5°F | Resp 14 | Ht 65.0 in | Wt 122.0 lb

## 2021-11-18 DIAGNOSIS — K2980 Duodenitis without bleeding: Secondary | ICD-10-CM | POA: Diagnosis not present

## 2021-11-18 DIAGNOSIS — K259 Gastric ulcer, unspecified as acute or chronic, without hemorrhage or perforation: Secondary | ICD-10-CM

## 2021-11-18 DIAGNOSIS — K222 Esophageal obstruction: Secondary | ICD-10-CM | POA: Diagnosis not present

## 2021-11-18 DIAGNOSIS — K449 Diaphragmatic hernia without obstruction or gangrene: Secondary | ICD-10-CM

## 2021-11-18 DIAGNOSIS — Z860100 Personal history of colon polyps, unspecified: Secondary | ICD-10-CM | POA: Insufficient documentation

## 2021-11-18 DIAGNOSIS — K31819 Angiodysplasia of stomach and duodenum without bleeding: Secondary | ICD-10-CM

## 2021-11-18 DIAGNOSIS — K2970 Gastritis, unspecified, without bleeding: Secondary | ICD-10-CM

## 2021-11-18 DIAGNOSIS — R195 Other fecal abnormalities: Secondary | ICD-10-CM | POA: Insufficient documentation

## 2021-11-18 DIAGNOSIS — D509 Iron deficiency anemia, unspecified: Secondary | ICD-10-CM

## 2021-11-18 DIAGNOSIS — K317 Polyp of stomach and duodenum: Secondary | ICD-10-CM | POA: Diagnosis not present

## 2021-11-18 MED ORDER — ESOMEPRAZOLE MAGNESIUM 40 MG PO CPDR: 40.0000 mg | DELAYED_RELEASE_CAPSULE | Freq: Every day | ORAL | 6 refills | Status: DC

## 2021-11-18 MED ORDER — SODIUM CHLORIDE 0.9 % IV SOLN: 500.0000 mL | Freq: Once | INTRAVENOUS | Status: DC

## 2021-11-18 NOTE — Progress Notes (Signed)
GASTROENTEROLOGY PROCEDURE H&P NOTE   Primary Care Physician: [name], MD  HPI: [name] is a 66 y.o. female who presents for EGD for evaluation of IDA.  Past Medical History:  Diagnosis Date   ADHD (attention deficit hyperactivity disorder) 09/07/2011   Anesthesia complication 59/16/3846   Will need GETA for future endoscopy procedures. Did not tolerate MAC well.   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   Diverticulosis    DVT (deep venous thrombosis) ([place]) 09/07/2011   Hypertension    on no medications   Hypothyroid 09/07/2011   Internal hemorrhoids    Iron deficiency anemia    Personal history of radiation therapy 2012   Pneumonia    Smoker 02/17/2013   Tubular adenoma of colon    Past Surgical History:  Procedure Laterality Date   ABDOMINAL HYSTERECTOMY     BIOPSY  12/04/2019   Procedure: BIOPSY;  Surgeon: Juanita Craver, MD;  [facility];  Service: Endoscopy;;   BIOPSY  06/14/2021   Procedure: BIOPSY;  Surgeon: Irving Copas., MD;  [facility];  Service: Gastroenterology;;   BREAST BIOPSY Left 2013   BREAST LUMPECTOMY  12/2011   Left breast   COLONOSCOPY     COLONOSCOPY WITH PROPOFOL N/A 12/04/2019   Procedure: COLONOSCOPY WITH PROPOFOL;  Surgeon: Juanita Craver, MD;  [facility];  Service: Endoscopy;  Laterality: N/A;   COLONOSCOPY WITH PROPOFOL N/A 09/20/2020   Procedure: COLONOSCOPY WITH PROPOFOL;  Surgeon: Rush Landmark Telford Nab., MD;  Location: [place];  Service: Gastroenterology;  Laterality: N/A;   COLONOSCOPY WITH PROPOFOL N/A 06/14/2021   Procedure: COLONOSCOPY WITH PROPOFOL;  Surgeon: Rush Landmark Telford Nab., MD;  [facility];  Service: Gastroenterology;  Laterality: N/A;   ENDOSCOPIC MUCOSAL RESECTION N/A 09/20/2020   Procedure:  ENDOSCOPIC MUCOSAL RESECTION;  Surgeon: Rush Landmark Telford Nab., MD;  Location: [place];  Service: Gastroenterology;  Laterality: N/A;   ESOPHAGOGASTRODUODENOSCOPY (EGD) WITH PROPOFOL N/A 12/04/2019   Procedure: ESOPHAGOGASTRODUODENOSCOPY (EGD) WITH PROPOFOL;  Surgeon: Juanita Craver, MD;  [facility];  Service: Endoscopy;  Laterality: N/A;   EYE SURGERY Right 06/2013   Lasix - Laser   HEMOSTASIS CLIP PLACEMENT  09/20/2020   Procedure: HEMOSTASIS CLIP PLACEMENT;  Surgeon: Irving Copas., MD;  Location: [place];  Service: Gastroenterology;;   POLYPECTOMY  12/04/2019   Procedure: POLYPECTOMY;  Surgeon: Juanita Craver, MD;  [facility];  Service: Endoscopy;;   POLYPECTOMY  09/20/2020   Procedure: POLYPECTOMY;  Surgeon: Irving Copas., MD;  Location: [place];  Service: Gastroenterology;;   POLYPECTOMY  06/14/2021   Procedure: POLYPECTOMY;  Surgeon: Irving Copas., MD;  Location: [facility];  Service: Gastroenterology;;   [place] LIFTING INJECTION  09/20/2020   Procedure: SUBMUCOSAL LIFTING INJECTION;  Surgeon: Irving Copas., MD;  Location: [place];  Service: Gastroenterology;;   UPPER GASTROINTESTINAL ENDOSCOPY     Current Outpatient Medications  Medication Sig Dispense Refill   amphetamine-dextroamphetamine (ADDERALL) 10 MG tablet Take 1 tablet (10 mg total) by mouth 2 (two) times daily. 60 tablet 0   B Complex-C (B-COMPLEX WITH VITAMIN C) tablet Take 1 tablet by mouth daily.     bisacodyl (DULCOLAX) 5 MG EC tablet Take 5 mg by mouth in the morning.     cholecalciferol (VITAMIN D) 1000  UNITS tablet Take 1,000 Units by mouth daily.     ferrous sulfate 325 (65 FE) MG tablet Take 325 mg by mouth daily with breakfast.     Multiple Vitamin (MULTIVITAMIN WITH MINERALS) TABS tablet Take 1 tablet by mouth daily. Centrum Silver     polycarbophil (FIBERCON) 625 MG tablet Take 625 mg by mouth daily.     rosuvastatin (CRESTOR)  20 MG tablet TAKE 1 TABLET DAILY 90 tablet 0   acetaminophen (TYLENOL) 650 MG CR tablet Take 650 mg by mouth every 8 (eight) hours as needed for pain.     [START ON 12/07/2021] amphetamine-dextroamphetamine (ADDERALL) 10 MG tablet Take 1 tablet (10 mg total) by mouth 2 (two) times daily. 60 tablet 0   [START ON 01/07/2022] amphetamine-dextroamphetamine (ADDERALL) 10 MG tablet Take 1 tablet (10 mg total) by mouth 2 (two) times daily. 60 tablet 0   calcium carbonate (TUMS - DOSED IN MG ELEMENTAL CALCIUM) 500 MG chewable tablet Chew 1 tablet by mouth 3 (three) times daily as needed for indigestion or heartburn.     levothyroxine (SYNTHROID) 75 MCG tablet TAKE 1 TABLET DAILY BEFORE BREAKFAST (Patient taking differently: Take 75 mcg by mouth daily before breakfast.) 90 tablet 3   naproxen (NAPROSYN) 375 MG tablet TAKE 1 TABLET TWICE A DAY WITH MEALS 180 tablet 3   Current Facility-Administered Medications  Medication Dose Route Frequency Provider Last Rate Last Admin   0.9 %  sodium chloride infusion  500 mL Intravenous Once Mansouraty, Telford Nab., MD        Current Outpatient Medications:    amphetamine-dextroamphetamine (ADDERALL) 10 MG tablet, Take 1 tablet (10 mg total) by mouth 2 (two) times daily., Disp: 60 tablet, Rfl: 0   B Complex-C (B-COMPLEX WITH VITAMIN C) tablet, Take 1 tablet by mouth daily., Disp: , Rfl:    bisacodyl (DULCOLAX) 5 MG EC tablet, Take 5 mg by mouth in the morning., Disp: , Rfl:    cholecalciferol (VITAMIN D) 1000 UNITS tablet, Take 1,000 Units by mouth daily., Disp: , Rfl:    ferrous sulfate 325 (65 FE) MG tablet, Take 325 mg by mouth daily with breakfast., Disp: , Rfl:    Multiple Vitamin (MULTIVITAMIN WITH MINERALS) TABS tablet, Take 1 tablet by mouth daily. Centrum Silver, Disp: , Rfl:    polycarbophil (FIBERCON) 625 MG tablet, Take 625 mg by mouth daily., Disp: , Rfl:    rosuvastatin (CRESTOR) 20 MG tablet, TAKE 1 TABLET DAILY, Disp: 90 tablet, Rfl: 0   acetaminophen  (TYLENOL) 650 MG CR tablet, Take 650 mg by mouth every 8 (eight) hours as needed for pain., Disp: , Rfl:    [START ON 12/07/2021] amphetamine-dextroamphetamine (ADDERALL) 10 MG tablet, Take 1 tablet (10 mg total) by mouth 2 (two) times daily., Disp: 60 tablet, Rfl: 0   [START ON 01/07/2022] amphetamine-dextroamphetamine (ADDERALL) 10 MG tablet, Take 1 tablet (10 mg total) by mouth 2 (two) times daily., Disp: 60 tablet, Rfl: 0   calcium carbonate (TUMS - DOSED IN MG ELEMENTAL CALCIUM) 500 MG chewable tablet, Chew 1 tablet by mouth 3 (three) times daily as needed for indigestion or heartburn., Disp: , Rfl:    levothyroxine (SYNTHROID) 75 MCG tablet, TAKE 1 TABLET DAILY BEFORE BREAKFAST (Patient taking differently: Take 75 mcg by mouth daily before breakfast.), Disp: 90 tablet, Rfl: 3   naproxen (NAPROSYN) 375 MG tablet, TAKE 1 TABLET TWICE A DAY WITH MEALS, Disp: 180 tablet, Rfl: 3  Current Facility-Administered Medications:    0.9 %  sodium chloride infusion, 500 mL, Intravenous, Once, Mansouraty, Telford Nab., MD Allergies  Allergen Reactions   Penicillins Hives    Did it involve swelling of the face/tongue/throat, SOB, or low BP? No Did it involve sudden or severe rash/hives, skin peeling, or any reaction on the inside of your mouth or nose? No Did you need to seek medical attention at a hospital or doctor's office? Yes When did it last happen?      35 years If all above answers are NO, may proceed with cephalosporin use.   Family History  Problem Relation Age of Onset   Stomach cancer Father    Esophageal cancer Father    Diabetes Maternal Grandmother    Crohn's disease Daughter    Ulcerative colitis Daughter    Colon cancer Neg Hx    Pancreatic cancer Neg Hx    Liver disease Neg Hx    Inflammatory bowel disease Neg Hx    Rectal cancer Neg Hx    Social History   Socioeconomic History   Marital status: Divorced    Spouse name: Not on file   Number of children: Not on file   Years  of education: Not on file   Highest education level: Not on file  Occupational History   Not on file  Tobacco Use   Smoking status: Every Day    Packs/day: 1.00    Years: 40.00    Pack years: 40.00    Types: E-cigarettes, Cigarettes   Smokeless tobacco: Never  Vaping Use   Vaping Use: Never used  Substance and Sexual Activity   Alcohol use: Yes    Alcohol/week: 0.0 standard drinks    Comment: 4 mixed drinks per week    Drug use: No   Sexual activity: Never  Other Topics Concern   Not on file  Social History Narrative   Not on file   Social Determinants of Health   Financial Resource Strain: Not on file  Food Insecurity: Not on file  Transportation Needs: Not on file  Physical Activity: Not on file  Stress: Not on file  Social Connections: Not on file  Intimate Partner Violence: Not on file    Physical Exam: Today's Vitals   11/18/21 0913 11/18/21 0915  BP: (!) 158/87   Pulse:  71  Temp: (!) 97.5 F (36.4 C) (!) 97.5 F (36.4 C)  TempSrc: Temporal   SpO2:  98%  Weight: 122 lb (55.3 kg)   Height: _0  (1.651 m)    Body mass index is 20.3 kg/m. GEN: NAD EYE: Sclerae anicteric ENT: MMM CV: Non-tachycardic GI: Soft, NT/ND NEURO:  Alert & Oriented x 3  Lab Results: No results for input(s): WBC, HGB, HCT, PLT in the last 72 hours. BMET No results for input(s): NA, K, CL, CO2, GLUCOSE, BUN, CREATININE, CALCIUM in the last 72 hours. LFT No results for input(s): PROT, ALBUMIN, AST, ALT, ALKPHOS, BILITOT, BILIDIR, IBILI in the last 72 hours. PT/INR No results for input(s): LABPROT, INR in the last 72 hours.   Impression / Plan: This is a 66 y.o.female who presents for EGD for evaluation of IDA.  The risks and benefits of endoscopic evaluation/treatment were discussed with the patient and/or family; these include but are not limited to the risk of perforation, infection, bleeding, missed lesions, lack of diagnosis, severe illness requiring hospitalization,  as well as anesthesia and sedation related illnesses.  The patient's history has been reviewed, patient examined, no change in status, and deemed stable for  procedure.  The patient and/or family is agreeable to proceed.    Justice Britain, MD [facility] Office # [phone]

## 2021-11-18 NOTE — Progress Notes (Signed)
Report to PACU, RN, vss, BBS= Clear.  

## 2021-11-18 NOTE — Progress Notes (Signed)
Called to room to assist during endoscopic procedure.  Patient ID and intended procedure confirmed with present staff. Received instructions for my participation in the procedure from the performing physician.  

## 2021-11-18 NOTE — Patient Instructions (Signed)
Handouts provided on gastritis and hiatal hernia.   Minimize an NSAID use for now including: aspirin, ibuprofen, naproxen, or other non-steriodal anti-inflammatory drugs (NSAIDs). You may use Tylenol if needed for mild pain or fever.   Start Nexium 40mg  by mouth daily.  Continue oral Iron daily.   Recheck labs by the end of next week or prior to leaving town.    YOU HAD AN ENDOSCOPIC PROCEDURE TODAY AT [facility]:   Refer to the procedure report that was given to you for any specific questions about what was found during the examination.  If the procedure report does not answer your questions, please call your gastroenterologist to clarify.  If you requested that your care partner not be given the details of your procedure findings, then the procedure report has been included in a sealed envelope for you to review at your convenience later.  YOU SHOULD EXPECT: Some feelings of bloating in the abdomen. Passage of more gas than usual.  Walking can help get rid of the air that was put into your GI tract during the procedure and reduce the bloating. If you had a lower endoscopy (such as a colonoscopy or flexible sigmoidoscopy) you may notice spotting of blood in your stool or on the toilet paper. If you underwent a bowel prep for your procedure, you may not have a normal bowel movement for a few days.  Please Note:  You might notice some irritation and congestion in your nose or some drainage.  This is from the oxygen used during your procedure.  There is no need for concern and it should clear up in a day or so.  SYMPTOMS TO REPORT IMMEDIATELY:  Following upper endoscopy (EGD)  Vomiting of blood or coffee ground material  New chest pain or pain under the shoulder blades  Painful or persistently difficult swallowing  New shortness of breath  Fever of 100F or higher  Black, tarry-looking stools  For urgent or emergent issues, a gastroenterologist can be reached at any hour by  calling [phone]. Do not use MyChart messaging for urgent concerns.    DIET:  We do recommend a small meal at first, but then you may proceed to your regular diet.  Drink plenty of fluids but you should avoid alcoholic beverages for 24 hours.  ACTIVITY:  You should plan to take it easy for the rest of today and you should NOT DRIVE or use heavy machinery until tomorrow (because of the sedation medicines used during the test).    FOLLOW UP: Our staff will call the number listed on your records 48-72 hours following your procedure to check on you and address any questions or concerns that you may have regarding the information given to you following your procedure. If we do not reach you, we will leave a message.  We will attempt to reach you two times.  During this call, we will ask if you have developed any symptoms of COVID 19. If you develop any symptoms (ie: fever, flu-like symptoms, shortness of breath, cough etc.) before then, please call [phone].  If you test positive for Covid 19 in the 2 weeks post procedure, please call and report this information to [place].    If any biopsies were taken you will be contacted by phone or by letter within the next 1-3 weeks.  Please call us at [phone] if you have not heard about the biopsies in 3 weeks.    SIGNATURES/CONFIDENTIALITY: You and/or your care  partner have signed paperwork which will be entered into your electronic medical record.  These signatures attest to the fact that that the information above on your After Visit Summary has been reviewed and is understood.  Full responsibility of the confidentiality of this discharge information lies with you and/or your care-partner.

## 2021-11-18 NOTE — Progress Notes (Signed)
Pt's states no medical or surgical changes since previsit or office visit. 

## 2021-11-18 NOTE — Op Note (Signed)
[facility] Patient Name: [name] Procedure Date: 11/18/2021 9:45 AM MRN: [id number] Endoscopist: Justice Britain , MD Age: 66 Referring MD:  Date of Birth: 10-Oct-1955 Gender: Female Account #: [id number] Procedure:                Upper GI endoscopy Indications:              Iron deficiency anemia, Unexplained iron deficiency                            anemia, Occult blood in stool Medicines:                Monitored Anesthesia Care Procedure:                Pre-Anesthesia Assessment:                           - Prior to the procedure, a History and Physical                            was performed, and patient medications and                            allergies were reviewed. The patient's tolerance of                            previous anesthesia was also reviewed. The risks                            and benefits of the procedure and the sedation                            options and risks were discussed with the patient.                            All questions were answered, and informed consent                            was obtained. Prior Anticoagulants: The patient has                            taken no previous anticoagulant or antiplatelet                            agents except for NSAID medication. ASA Grade                            Assessment: III - A patient with severe systemic                            disease. After reviewing the risks and benefits,                            the patient was deemed in satisfactory condition to  undergo the procedure.                           After obtaining informed consent, the endoscope was                            passed under direct vision. Throughout the                            procedure, the patient's blood pressure, pulse, and                            oxygen saturations were monitored continuously. The                            GIF [id number] #[id number] was introduced  through the                            mouth, and advanced to the third part of duodenum.                            The upper GI endoscopy was accomplished without                            difficulty. The patient tolerated the procedure. Scope In: Scope Out: Findings:                 No gross lesions were noted in the entire esophagus.                           The Z-line was irregular and was found 36 cm from                            the incisors.                           A non-obstructing Schatzki ring was found at the                            gastroesophageal junction.                           A 3 cm hiatal hernia was present.                           Localized moderate inflammation characterized by                            erosions and friability was found in the gastric                            antrum and in the prepyloric region of the stomach.                           No other gross lesions were noted in  the entire                            examined stomach. Biopsies were taken with a cold                            forceps for histology and Helicobacter pylori                            testing.                           A single diminutive angioectasia without bleeding                            was found in the duodenal bulb.                           A single 3 mm sessile polyp was found in the                            duodenal bulb. The polyp was removed with a                            piecemeal technique using a cold biopsy forceps.                            Resection and retrieval were complete.                           Patchy mild inflammation characterized by                            congestion (edema) and erythema was found in the                            duodenal bulb, in the first portion of the duodenum                            and in the second portion of the duodenum. Complications:            No immediate complications. Estimated Blood  Loss:     Estimated blood loss was minimal. Impression:               - No gross lesions in esophagus. Z-line irregular,                            36 cm from the incisors. Non-obstructing Schatzki                            ring.                           - 3 cm hiatal hernia.                           -  Gastritis and erosions in the antrum/prepylorus.                            No other gross lesions in the stomach. Biopsied.                           - A single non-bleeding angioectasia in the                            duodenum.                           - A single duodenal polyp in the bulb. Resected and                            retrieved.                           - Duodenitis. Recommendation:           - The patient will be observed post-procedure,                            until all discharge criteria are met.                           - Discharge patient to home.                           - Patient has a contact number available for                            emergencies. The signs and symptoms of potential                            delayed complications were discussed with the                            patient. Return to normal activities tomorrow.                            Written discharge instructions were provided to the                            patient.                           - Resume previous diet.                           - Minimize any NSAID use for now.                           - Start Nexium 40 mg daily.                           - Observe patient's clinical course.                           -  Await pathology results.                           - Continue Oral iron daily.                           - Recheck CBC/Iron/TIBC/Ferritin by end of next                            week or prior to leaving town.                           - As long as hemoglobin is stable, then we can                            decide next steps in evaluation which could be VCE.                            - Would perform VCE prior to consideration of EGD                            with APC ablation to get better assessment of the                            entire small bowel.                           - The findings and recommendations were discussed                            with the patient.                           - The findings and recommendations were discussed                            with the patient's family. Justice Britain, MD 11/18/2021 10:11:21 AM

## 2021-11-21 ENCOUNTER — Inpatient Hospital Stay: Payer: Medicare Other | Attending: Adult Health | Admitting: Adult Health

## 2021-11-21 ENCOUNTER — Other Ambulatory Visit: Payer: Self-pay

## 2021-11-21 ENCOUNTER — Telehealth: Payer: Self-pay | Admitting: Adult Health

## 2021-11-21 ENCOUNTER — Encounter: Payer: Self-pay | Admitting: Adult Health

## 2021-11-21 VITALS — BP 161/82 | HR 74 | Temp 97.5°F | Resp 16 | Ht 65.0 in | Wt 124.5 lb

## 2021-11-21 DIAGNOSIS — Z122 Encounter for screening for malignant neoplasm of respiratory organs: Secondary | ICD-10-CM | POA: Diagnosis not present

## 2021-11-21 DIAGNOSIS — Z170 Estrogen receptor positive status [ER+]: Secondary | ICD-10-CM

## 2021-11-21 DIAGNOSIS — C50412 Malignant neoplasm of upper-outer quadrant of left female breast: Secondary | ICD-10-CM | POA: Diagnosis not present

## 2021-11-21 DIAGNOSIS — D509 Iron deficiency anemia, unspecified: Secondary | ICD-10-CM | POA: Diagnosis not present

## 2021-11-21 DIAGNOSIS — Z853 Personal history of malignant neoplasm of breast: Secondary | ICD-10-CM | POA: Insufficient documentation

## 2021-11-21 DIAGNOSIS — Z79899 Other long term (current) drug therapy: Secondary | ICD-10-CM | POA: Insufficient documentation

## 2021-11-21 DIAGNOSIS — Z923 Personal history of irradiation: Secondary | ICD-10-CM | POA: Insufficient documentation

## 2021-11-21 NOTE — Telephone Encounter (Signed)
Scheduled appointment per 2/20 los. Patient is aware.

## 2021-11-21 NOTE — Progress Notes (Signed)
CLINIC:  Survivorship   REASON FOR VISIT:  Routine follow-up for history of breast cancer.   BRIEF ONCOLOGIC HISTORY:  Oncology History  Breast cancer of upper-outer quadrant of left female breast ([place])  01/30/2007 Surgery   Left breast lumpectomy: High-grade DCIS with focal necrosis 1.7 cm ER 54%, PR 74%   03/26/2007 - 05/13/2007 Radiation Therapy   Adjuvant radiation therapy   06/05/2007 - 06/05/2012 Anti-estrogen oral therapy   Arimidex 1 mg daily      INTERVAL HISTORY:  [name] presents to the [facility] today for routine follow-up for her history of breast cancer.    Since her last visit she underwent bilateral breast 3d mammogram on November 15, 2021.  This showed no evidence of malignancy and breast density category B.  She underwent lung cancer screening with low-dose CT in March 2022.  This recommended a second low-dose CT chest in 6 months time.  This was repeated in September 2022.  The CT scan reported benign appearance and repeat CT was recommended in 1 year time.  The CT scan also noted aortic atherosclerosis and emphysema.  Her most recent colon cancer screening was completed in September 2022.  States he has been iron deficient and has a history of colonic polyps and fecal immunochemical test was positive.  She was restarted on oral iron.  She is followed by Dr. Rush Landmark with GI who did an upper endoscopy on her February 17 and noted gastric erosions.  [name] notes that she is feeling fatigued and is about to go out of town and would like to not feel so poorly when she does so.  She notes she also is not sure how quickly the oral iron will increase her iron levels.   REVIEW OF SYSTEMS:  Review of Systems  Constitutional:  Positive for fatigue. Negative for appetite change, chills, fever and unexpected weight change.  HENT:   Negative for hearing loss, lump/mass, sore throat and trouble swallowing.   Eyes:  Negative for eye problems and icterus.   Respiratory:  Negative for chest tightness, cough and shortness of breath.   Cardiovascular:  Negative for chest pain, leg swelling and palpitations.  Gastrointestinal:  Negative for abdominal distention, abdominal pain, blood in stool, constipation, diarrhea, nausea and vomiting.  Endocrine: Negative for hot flashes.  Genitourinary:  Negative for difficulty urinating.   Musculoskeletal:  Negative for arthralgias.  Skin:  Negative for itching and rash.  Neurological:  Negative for dizziness.  Hematological:  Negative for adenopathy. Does not bruise/bleed easily.  Psychiatric/Behavioral:  Negative for depression. The patient is not nervous/anxious.  Breast: Denies any new nodularity, masses, tenderness, nipple changes, or nipple discharge.       PAST MEDICAL/SURGICAL HISTORY:  Past Medical History:  Diagnosis Date   ADHD (attention deficit hyperactivity disorder) 09/07/2011   Anesthesia complication 97/98/9211   Will need GETA for future endoscopy procedures. Did not tolerate MAC well.   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   Diverticulosis    DVT (deep venous thrombosis) ([place]) 09/07/2011   Hypertension    on no medications   Hypothyroid 09/07/2011   Internal hemorrhoids    Iron deficiency anemia    Personal history of radiation therapy 2012   Pneumonia    Smoker 02/17/2013   Tubular adenoma of  colon    Past Surgical History:  Procedure Laterality Date   ABDOMINAL HYSTERECTOMY     BIOPSY  12/04/2019   Procedure: BIOPSY;  Surgeon: Juanita Craver, MD;  Location: [facility];  Service: Endoscopy;;   BIOPSY  06/14/2021   Procedure: BIOPSY;  Surgeon: Irving Copas., MD;  Location: [facility];  Service: Gastroenterology;;   BREAST BIOPSY Left 2013   BREAST LUMPECTOMY  12/2011   Left breast   COLONOSCOPY      COLONOSCOPY WITH PROPOFOL N/A 12/04/2019   Procedure: COLONOSCOPY WITH PROPOFOL;  Surgeon: Juanita Craver, MD;  Location: [facility];  Service: Endoscopy;  Laterality: N/A;   COLONOSCOPY WITH PROPOFOL N/A 09/20/2020   Procedure: COLONOSCOPY WITH PROPOFOL;  Surgeon: Rush Landmark Telford Nab., MD;  Location: [place];  Service: Gastroenterology;  Laterality: N/A;   COLONOSCOPY WITH PROPOFOL N/A 06/14/2021   Procedure: COLONOSCOPY WITH PROPOFOL;  Surgeon: Rush Landmark Telford Nab., MD;  Location: [facility];  Service: Gastroenterology;  Laterality: N/A;   ENDOSCOPIC MUCOSAL RESECTION N/A 09/20/2020   Procedure: ENDOSCOPIC MUCOSAL RESECTION;  Surgeon: Rush Landmark Telford Nab., MD;  Location: [place];  Service: Gastroenterology;  Laterality: N/A;   ESOPHAGOGASTRODUODENOSCOPY (EGD) WITH PROPOFOL N/A 12/04/2019   Procedure: ESOPHAGOGASTRODUODENOSCOPY (EGD) WITH PROPOFOL;  Surgeon: Juanita Craver, MD;  Location: [facility];  Service: Endoscopy;  Laterality: N/A;   EYE SURGERY Right 06/2013   Lasix - Laser   HEMOSTASIS CLIP PLACEMENT  09/20/2020   Procedure: HEMOSTASIS CLIP PLACEMENT;  Surgeon: Irving Copas., MD;  Location: [place];  Service: Gastroenterology;;   POLYPECTOMY  12/04/2019   Procedure: POLYPECTOMY;  Surgeon: Juanita Craver, MD;  Location: [facility];  Service: Endoscopy;;   POLYPECTOMY  09/20/2020   Procedure: POLYPECTOMY;  Surgeon: Irving Copas., MD;  Location: [place];  Service: Gastroenterology;;   POLYPECTOMY  06/14/2021   Procedure: POLYPECTOMY;  Surgeon: Irving Copas., MD;  Location: [facility];  Service: Gastroenterology;;   SUBMUCOSAL LIFTING INJECTION  09/20/2020   Procedure: SUBMUCOSAL LIFTING INJECTION;  Surgeon: Irving Copas., MD;  Location: [facility];  Service: Gastroenterology;;   UPPER GASTROINTESTINAL ENDOSCOPY       ALLERGIES:  Allergies  Allergen Reactions   Penicillins Hives    Did it involve swelling  of the face/tongue/throat, SOB, or low BP? No Did it involve sudden or severe rash/hives, skin peeling, or any reaction on the inside of your mouth or nose? No Did you need to seek medical attention at a hospital or doctor's office? Yes When did it last happen?      35 years If all above answers are NO, may proceed with cephalosporin use.     CURRENT MEDICATIONS:  Outpatient Encounter Medications as of 11/21/2021  Medication Sig Note   acetaminophen (TYLENOL) 650 MG CR tablet Take 650 mg by mouth every 8 (eight) hours as needed for pain.    [START ON 12/07/2021] amphetamine-dextroamphetamine (ADDERALL) 10 MG tablet Take 1 tablet (10 mg total) by mouth 2 (two) times daily.    [START ON 01/07/2022] amphetamine-dextroamphetamine (ADDERALL) 10 MG tablet Take 1 tablet (10 mg total) by mouth 2 (two) times daily.    amphetamine-dextroamphetamine (ADDERALL) 10 MG tablet Take 1 tablet (10 mg total) by mouth 2 (two) times daily.    B Complex-C (B-COMPLEX WITH VITAMIN C) tablet Take 1 tablet by mouth daily.    bisacodyl (DULCOLAX) 5 MG EC tablet Take 5 mg by mouth in the morning.    calcium carbonate (TUMS - DOSED IN MG ELEMENTAL CALCIUM) 500  MG chewable tablet Chew 1 tablet by mouth 3 (three) times daily as needed for indigestion or heartburn.    cholecalciferol (VITAMIN D) 1000 UNITS tablet Take 1,000 Units by mouth daily.    esomeprazole (NEXIUM) 40 MG capsule Take 1 capsule (40 mg total) by mouth daily.    ferrous sulfate 325 (65 FE) MG tablet Take 325 mg by mouth daily with breakfast.    levothyroxine (SYNTHROID) 75 MCG tablet TAKE 1 TABLET DAILY BEFORE BREAKFAST (Patient taking differently: Take 75 mcg by mouth daily before breakfast.)    Multiple Vitamin (MULTIVITAMIN WITH MINERALS) TABS tablet Take 1 tablet by mouth daily. Centrum Silver    naproxen (NAPROSYN) 375 MG tablet TAKE 1 TABLET TWICE A DAY WITH MEALS 06/08/2021: ON HOLD    polycarbophil (FIBERCON) 625 MG tablet Take 625 mg by mouth daily.     rosuvastatin (CRESTOR) 20 MG tablet TAKE 1 TABLET DAILY    No facility-administered encounter medications on file as of 11/21/2021.     ONCOLOGIC FAMILY HISTORY:  Family History  Problem Relation Age of Onset   Stomach cancer Father    Esophageal cancer Father    Diabetes Maternal Grandmother    Crohn's disease Daughter    Ulcerative colitis Daughter    Colon cancer Neg Hx    Pancreatic cancer Neg Hx    Liver disease Neg Hx    Inflammatory bowel disease Neg Hx    Rectal cancer Neg Hx     GENETIC COUNSELING/TESTING: Not at this time  SOCIAL HISTORY:  [name] is divorced and lives with her daughter and grandson in [place], [place].  Her daughter passed in 2020.  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  She denies any current or history of or illicit drug use.  She is a current every day smoker of 1ppd for 30+ years.  Not ready to quit today.  Occasional drink of beer.  (updated 11/05/2020)   PHYSICAL EXAMINATION:  Vital Signs: Vitals:   11/21/21 0906  BP: (!) 161/82  Pulse: 74  Resp: 16  Temp: (!) 97.5 F (36.4 C)  SpO2: 100%   Filed Weights   11/21/21 0906  Weight: 124 lb 8 oz (56.5 kg)   General: Well-nourished, well-appearing female in no acute distress.  Unaccompanied today.   HEENT: Head is normocephalic.  Pupils equal and reactive to light. Conjunctivae clear without exudate.  Sclerae anicteric. Oral mucosa is pink, moist.  Oropharynx is pink without lesions or erythema.  Lymph: No cervical, supraclavicular, or infraclavicular lymphadenopathy noted on palpation.  Cardiovascular: Regular rate and rhythm.Marland Kitchen Respiratory: Clear to auscultation bilaterally. Chest expansion symmetric; breathing non-labored.  Breast Exam:  -Left breast: No appreciable masses on palpation. No skin redness, thickening, or peau d'orange appearance; no nipple retraction or nipple discharge; mild distortion in  symmetry at previous lumpectomy site well healed scar without erythema or nodularity.  -Right breast: No appreciable masses on palpation. No skin redness, thickening, or peau d'orange appearance; no nipple retraction or nipple discharge; -Axilla: No axillary adenopathy bilaterally.  GI: Abdomen soft and round; non-tender, non-distended. Bowel sounds normoactive. No hepatosplenomegaly.   GU: Deferred.  Neuro: No focal deficits. Steady gait.  Psych: Mood and affect normal and appropriate for situation.  MSK: No focal spinal tenderness to palpation, full range of motion in bilateral upper extremities Extremities: No edema. Skin: Warm and dry.  LABORATORY DATA:  None for this visit  DIAGNOSTIC IMAGING:  Most recent mammogram:  CLINICAL DATA:  Screening.   EXAM: DIGITAL SCREENING BILATERAL MAMMOGRAM WITH CAD   TECHNIQUE: Bilateral screening digital craniocaudal and mediolateral oblique mammograms were obtained. The images were evaluated with computer-aided detection.   COMPARISON:  Previous exam(s).   ACR Breast Density Category b: There are scattered areas of fibroglandular density.   FINDINGS: There are no findings suspicious for malignancy.   IMPRESSION: No mammographic evidence of malignancy. A result letter of this screening mammogram will be mailed directly to the patient.   RECOMMENDATION: Screening mammogram in one year. (Code:SM-B-01Y)   BI-RADS CATEGORY  1: Negative.     Electronically Signed   By: Franki Cabot M.D.   On: 11/15/2021 08:42   CLINICAL DATA:  66 year old female current smoker with 40 pack-year history of smoking. Follow-up for prior abnormal low-dose lung cancer screening chest CT.   EXAM: CT CHEST WITHOUT CONTRAST   TECHNIQUE: Multidetector CT imaging of the chest was performed following the standard protocol without IV contrast.   COMPARISON:  Low-dose lung cancer screening chest CT 12/29/2020.   FINDINGS: Cardiovascular: Heart size  is normal. There is no significant pericardial fluid, thickening or pericardial calcification. There is aortic atherosclerosis, as well as atherosclerosis of the great vessels of the mediastinum and the coronary arteries, including calcified atherosclerotic plaque in the left main and right coronary arteries.   Mediastinum/Nodes: No pathologically enlarged mediastinal or hilar lymph nodes. Please note that accurate exclusion of hilar adenopathy is limited on noncontrast CT scans. Esophagus is unremarkable in appearance. No axillary lymphadenopathy.   Lungs/Pleura: Previously noted nodule of concern in the right lower lobe has regressed, currently measuring only 3.1 mm, likely a small amount of mucoid impaction within a bronchus. Other previously noted pulmonary nodules are similar in size, number and pattern of distribution. No other new suspicious appearing pulmonary nodules or masses are noted. No acute consolidative airspace disease. No pleural effusions. Mild diffuse bronchial wall thickening with mild centrilobular and paraseptal emphysema.   Upper Abdomen: Aortic atherosclerosis. Low-attenuation adreniform thickening of both adrenal glands, similar to prior studies, most compatible with adenomatous hyperplasia.   Musculoskeletal: Focal architectural distortion and dense calcifications in the lateral aspect of the left breast, potentially fat necrosis at site of prior lumpectomy. There are no aggressive appearing lytic or blastic lesions noted in the visualized portions of the skeleton.   IMPRESSION: 1. Lung-RADS 2S, benign appearance or behavior. Continue annual screening with low-dose chest CT without contrast in 12 months. 2. The "S" modifier above refers to potentially clinically significant non lung cancer related findings. Specifically, there is aortic atherosclerosis, in addition to left main and right coronary artery disease. Please note that although the presence of  coronary artery calcium documents the presence of coronary artery disease, the severity of this disease and any potential stenosis cannot be assessed on this non-gated CT examination. Assessment for potential risk factor modification, dietary therapy or pharmacologic therapy may be warranted, if clinically indicated. 3. Mild diffuse bronchial wall thickening with mild centrilobular and paraseptal emphysema; imaging findings suggestive of underlying COPD.   Aortic Atherosclerosis (ICD10-I70.0) and Emphysema (ICD10-J43.9).     Electronically Signed   By: Vinnie Langton M.D.   On: 06/04/2021 07:56 ASSESSMENT AND PLAN:  [name] is a pleasant 66 y.o. 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  She  [facility] for surveillance and routine follow-up.   1. History of breast cancer:  [name] is currently clinically and radiographically without evidence of disease or recurrence of breast cancer.  She is due for repeat mammogram in February 2024.  We will see her back in one year for continued surveillance and monitoring   I encouraged her to call me with any questions or concerns before her next visit at the cancer center, and I would be happy to see her sooner, if needed.    2.  Iron deficiency: States he is currently undergoing work-up with GI.  She is struggling with her level of fatigue and planning to go out of town to help with her mom and this visit includes her kids and grandkids.  We discussed the role of IV iron and how it can help bring her iron level up.  She would like to proceed with this.  We will add reviewed risks and benefits in detail and she is agreeable to proceed.  I reached out to her healthcare team as she says they want labs on her before she goes out of town to ask if there is  anything I may order on their behalf.  3. Bone health:  Given [name]'s age, history of breast cancer, and her previous anti-estrogen therapy with Anastrozole, she is at risk for bone demineralization. She underwent repeat bone density testing that demonstrated osteopenia.  She declines further testing and will follow-up with her primary care provider if she desires to proceed with this.  4. Cancer screening:  Due to [name]'s history and her age, she should receive screening for skin cancers, colon cancer, lung cancer and gynecologic cancers.  I ordered her repeat lung cancer screening which is due in September 2023.  She was encouraged to follow-up with her PCP for appropriate cancer screenings.   5. Health maintenance and wellness promotion: [name] was encouraged to consume 5-7 servings of fruits and vegetables per day. She was also encouraged to engage in moderate to vigorous exercise for 30 minutes per day most days of the week. She was instructed to limit her alcohol consumption and stop smoking.    Dispo:  -Return to cancer center in one year for LTS follow up -Mammogram in 11/2022 -IV Iron  -Lung Cancer Screening 06/2022 -Bone density declined  Total encounter time: 30 minutes*in face-to-face visit time, chart review, lab review, order entry, care coordination, discussion with other providers, and documentation of the encounter.  Wilber Bihari, NP 11/21/21 10:17 AM Medical Oncology and Hematology [facility] [place], [place] [address] Tel. [phone]    Fax. [phone]  *Total Encounter Time as defined by the Centers for Medicare and Medicaid Services includes, in addition to the face-to-face time of a patient visit (documented in the note above) non-face-to-face time: obtaining and reviewing outside history, ordering and reviewing medications, tests or procedures, care coordination (communications with other health care professionals or  caregivers) and documentation in the medical record.   Note: PRIMARY CARE PROVIDER [name], [place] [phone]

## 2021-11-22 ENCOUNTER — Telehealth: Payer: Self-pay | Admitting: Gastroenterology

## 2021-11-22 NOTE — Telephone Encounter (Signed)
Case ID :[id number] Approved 10/23/2021-10/01/2098 per Cover my meds. Pharmacy has been informed and will contact patient when prescription is ready for pick up.

## 2021-11-22 NOTE — Telephone Encounter (Signed)
Patient called states her insurance is requesting we send cpt code along with script for Nexium. Also call patient once done to let her know.

## 2021-11-23 ENCOUNTER — Encounter: Payer: Self-pay | Admitting: Gastroenterology

## 2021-11-23 ENCOUNTER — Telehealth: Payer: Self-pay

## 2021-11-23 NOTE — Telephone Encounter (Signed)
°  Follow up Call-  Call back number 11/18/2021 07/26/2020  Post procedure Call Back phone  # [phone]  Permission to leave phone message Yes Yes  Some recent data might be hidden     Patient questions:  Do you have a fever, pain , or abdominal swelling? No. Pain Score  0 *  Have you tolerated food without any problems? Yes.    Have you been able to return to your normal activities? Yes.    Do you have any questions about your discharge instructions: Diet   No. Medications  No. Follow up visit  No.  Do you have questions or concerns about your Care? No.  Actions: * If pain score is 4 or above: No action needed, pain <4.

## 2021-11-25 ENCOUNTER — Other Ambulatory Visit: Payer: Self-pay

## 2021-11-25 ENCOUNTER — Inpatient Hospital Stay: Payer: Medicare Other

## 2021-11-25 VITALS — BP 149/84 | HR 78 | Temp 98.6°F | Resp 17

## 2021-11-25 DIAGNOSIS — D500 Iron deficiency anemia secondary to blood loss (chronic): Secondary | ICD-10-CM

## 2021-11-25 DIAGNOSIS — D509 Iron deficiency anemia, unspecified: Secondary | ICD-10-CM | POA: Diagnosis not present

## 2021-11-25 DIAGNOSIS — C50412 Malignant neoplasm of upper-outer quadrant of left female breast: Secondary | ICD-10-CM

## 2021-11-25 DIAGNOSIS — Z170 Estrogen receptor positive status [ER+]: Secondary | ICD-10-CM

## 2021-11-25 LAB — CBC WITH DIFFERENTIAL (CANCER CENTER ONLY)
Abs Immature Granulocytes: 0.02 10*3/uL (ref 0.00–0.07)
Basophils Absolute: 0.1 10*3/uL (ref 0.0–0.1)
Basophils Relative: 1 %
Eosinophils Absolute: 0.3 10*3/uL (ref 0.0–0.5)
Eosinophils Relative: 3 %
HCT: 34.4 % — ABNORMAL LOW (ref 36.0–46.0)
Hemoglobin: 11.1 g/dL — ABNORMAL LOW (ref 12.0–15.0)
Immature Granulocytes: 0 %
Lymphocytes Relative: 20 %
Lymphs Abs: 1.7 10*3/uL (ref 0.7–4.0)
MCH: 27.4 pg (ref 26.0–34.0)
MCHC: 32.3 g/dL (ref 30.0–36.0)
MCV: 84.9 fL (ref 80.0–100.0)
Monocytes Absolute: 0.6 10*3/uL (ref 0.1–1.0)
Monocytes Relative: 8 %
Neutro Abs: 5.6 10*3/uL (ref 1.7–7.7)
Neutrophils Relative %: 68 %
Platelet Count: 485 10*3/uL — ABNORMAL HIGH (ref 150–400)
RBC: 4.05 MIL/uL (ref 3.87–5.11)
RDW: 21 % — ABNORMAL HIGH (ref 11.5–15.5)
WBC Count: 8.3 10*3/uL (ref 4.0–10.5)
nRBC: 0 % (ref 0.0–0.2)

## 2021-11-25 LAB — IRON AND IRON BINDING CAPACITY (CC-WL,HP ONLY)
Iron: 226 ug/dL — ABNORMAL HIGH (ref 28–170)
Saturation Ratios: 46 % — ABNORMAL HIGH (ref 10.4–31.8)
TIBC: 491 ug/dL — ABNORMAL HIGH (ref 250–450)
UIBC: 265 ug/dL (ref 148–442)

## 2021-11-25 LAB — FERRITIN: Ferritin: 20 ng/mL (ref 11–307)

## 2021-11-25 MED ORDER — LORATADINE 10 MG PO TABS
10.0000 mg | ORAL_TABLET | Freq: Once | ORAL | Status: AC
  Administered 2021-11-25: 10 mg via ORAL
  Filled 2021-11-25: qty 1

## 2021-11-25 MED ORDER — ACETAMINOPHEN 325 MG PO TABS
650.0000 mg | ORAL_TABLET | Freq: Once | ORAL | Status: AC
  Administered 2021-11-25: 650 mg via ORAL
  Filled 2021-11-25: qty 2

## 2021-11-25 MED ORDER — SODIUM CHLORIDE 0.9 % IV SOLN
510.0000 mg | Freq: Once | INTRAVENOUS | Status: AC
  Administered 2021-11-25: 510 mg via INTRAVENOUS
  Filled 2021-11-25: qty 510

## 2021-11-25 MED ORDER — SODIUM CHLORIDE 0.9 % IV SOLN: Freq: Once | INTRAVENOUS | Status: AC

## 2021-11-25 NOTE — Patient Instructions (Signed)

## 2021-11-27 ENCOUNTER — Encounter: Payer: Self-pay | Admitting: Gastroenterology

## 2021-11-27 ENCOUNTER — Encounter: Payer: Self-pay | Admitting: Family Medicine

## 2021-12-08 ENCOUNTER — Other Ambulatory Visit: Payer: Self-pay

## 2021-12-08 ENCOUNTER — Other Ambulatory Visit: Payer: Medicare Other

## 2021-12-08 DIAGNOSIS — D509 Iron deficiency anemia, unspecified: Secondary | ICD-10-CM

## 2021-12-08 LAB — CBC WITH DIFFERENTIAL/PLATELET
Absolute Monocytes: 581 cells/uL (ref 200–950)
Basophils Absolute: 68 cells/uL (ref 0–200)
Basophils Relative: 1.2 %
Eosinophils Absolute: 399 cells/uL (ref 15–500)
Eosinophils Relative: 7 %
HCT: 38.9 % (ref 35.0–45.0)
Hemoglobin: 12.6 g/dL (ref 11.7–15.5)
Lymphs Abs: 1374 cells/uL (ref 850–3900)
MCH: 28.9 pg (ref 27.0–33.0)
MCHC: 32.4 g/dL (ref 32.0–36.0)
MCV: 89.2 fL (ref 80.0–100.0)
MPV: 11.1 fL (ref 7.5–12.5)
Monocytes Relative: 10.2 %
Neutro Abs: 3278 cells/uL (ref 1500–7800)
Neutrophils Relative %: 57.5 %
Platelets: 481 10*3/uL — ABNORMAL HIGH (ref 140–400)
RBC: 4.36 10*6/uL (ref 3.80–5.10)
RDW: 20.3 % — ABNORMAL HIGH (ref 11.0–15.0)
Total Lymphocyte: 24.1 %
WBC: 5.7 10*3/uL (ref 3.8–10.8)

## 2021-12-09 ENCOUNTER — Inpatient Hospital Stay: Payer: Medicare Other

## 2021-12-13 ENCOUNTER — Encounter: Payer: Self-pay | Admitting: Family Medicine

## 2021-12-19 ENCOUNTER — Telehealth: Payer: Self-pay | Admitting: Gastroenterology

## 2021-12-19 DIAGNOSIS — D509 Iron deficiency anemia, unspecified: Secondary | ICD-10-CM

## 2021-12-19 NOTE — Telephone Encounter (Signed)
Inbound call from patient would like to schedule capsule  ?

## 2021-12-20 NOTE — Telephone Encounter (Signed)
Left message on machine to call back  

## 2021-12-20 NOTE — Telephone Encounter (Signed)
The pt has been scheduled for 12/27/21 at 830 am for capsule endoscopy.  She will be provided the instructions via My Chart per her preference.  Referral has been entered.   ?

## 2021-12-22 NOTE — Telephone Encounter (Signed)
Patient called to reschedule capsule ?

## 2021-12-23 NOTE — Telephone Encounter (Signed)
Tried to reach the pt and line rings then goes busy 

## 2021-12-23 NOTE — Telephone Encounter (Signed)
The pt reached out to me on My Chart. See those messages for further communication.  ?

## 2021-12-24 IMAGING — CT CT CHEST NODULE FOLLOW UP LOW DOSE W/O CM
3 of 4 series · 14 of 36 positions shown, 15 images · non-contrast
Comparison: Low-dose lung cancer screening chest CT 12/29/2020.

CLINICAL DATA: 64-year-old female current smoker with 40 pack-year
history of smoking. Follow-up for prior abnormal low-dose lung
cancer screening chest CT.

EXAM:
CT CHEST WITHOUT CONTRAST
TECHNIQUE: Multidetector CT imaging of the chest was performed following the
standard protocol without IV contrast.

[Series 2: axial st · axial · 0.64mm/px · z∈[-271,-121]mm · 3 of 62 slices shown, 4 images]
[im 16/62  mediastinal]
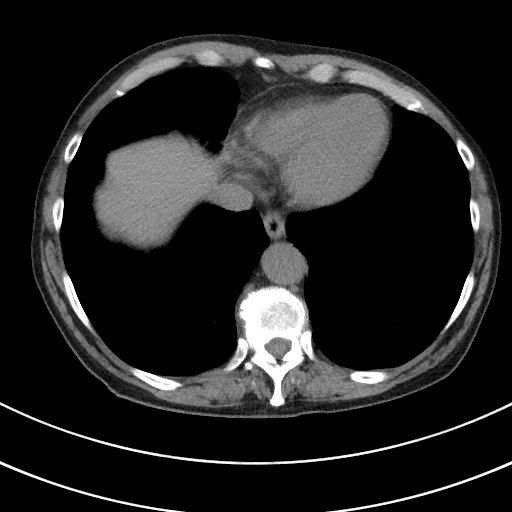
[im 16/62  lung]
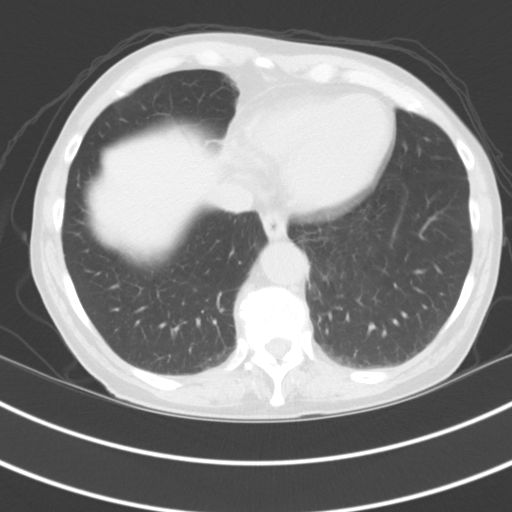
[im 31/62  lung]
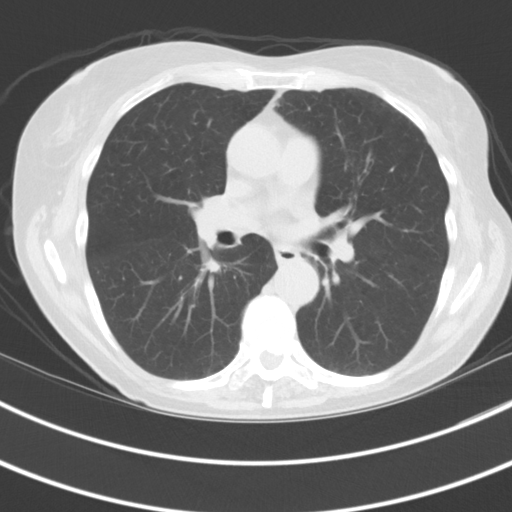
[im 46/62  lung]
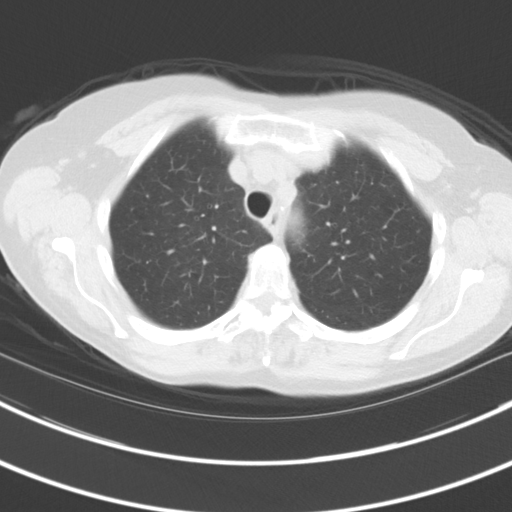

[Series 4: coronal · coronal · 0.65mm/px · 3 of 277 slices shown]
[im 56/277  lung]
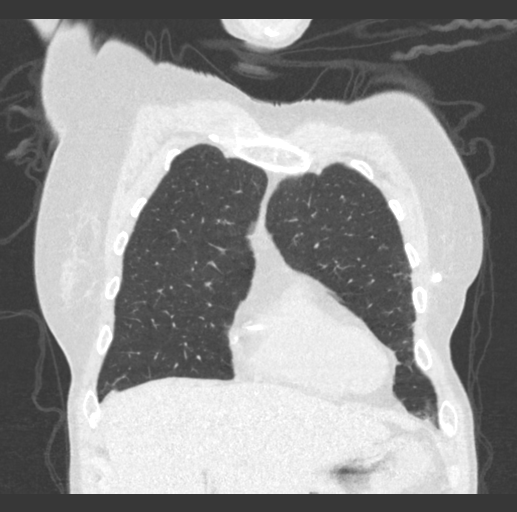
[im 111/277  lung]
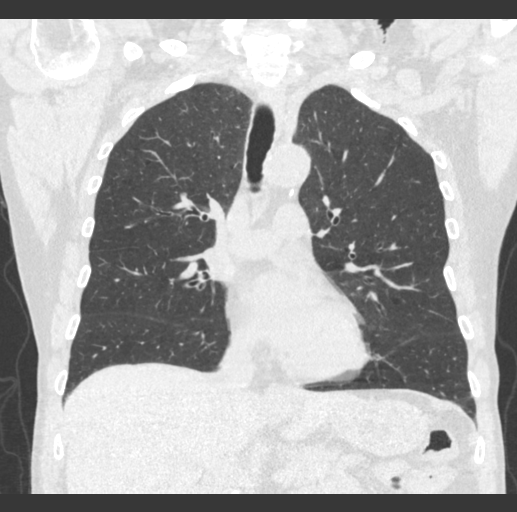
[im 166/277  lung]
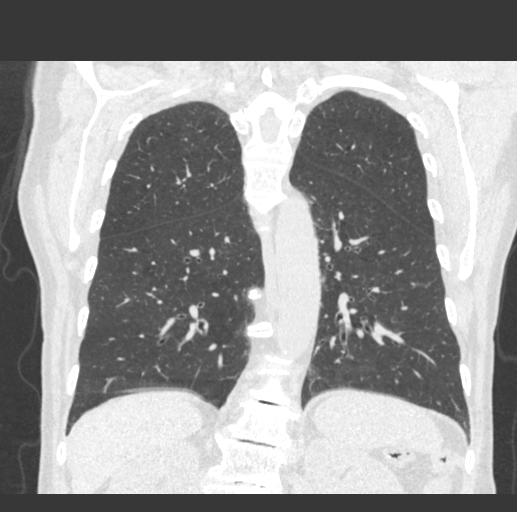

[Series 6: lung thins · axial · 0.64mm/px · z∈[-306,-68]mm · 8 of 292 slices shown]
[im 27/292  lung]
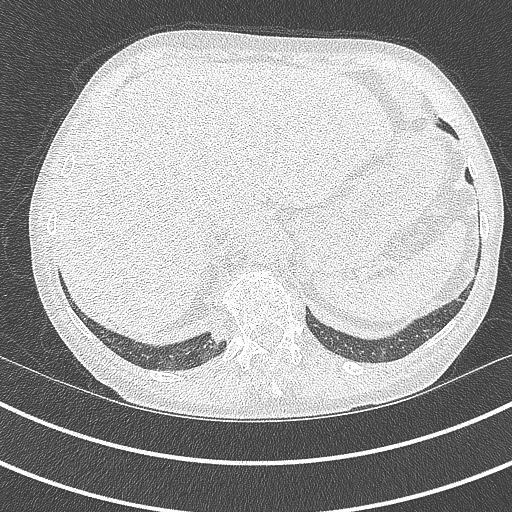
[im 53/292  lung]
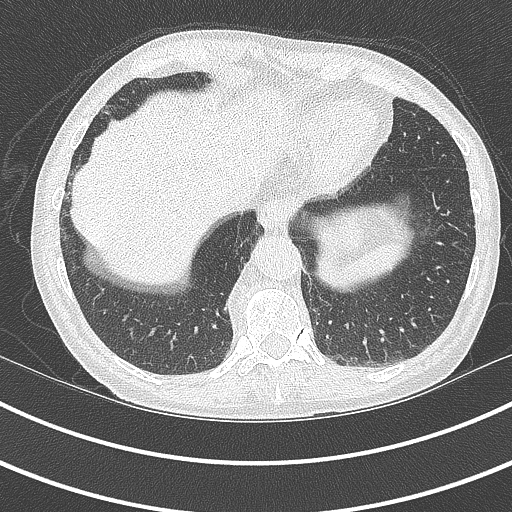
[im 93/292  lung]
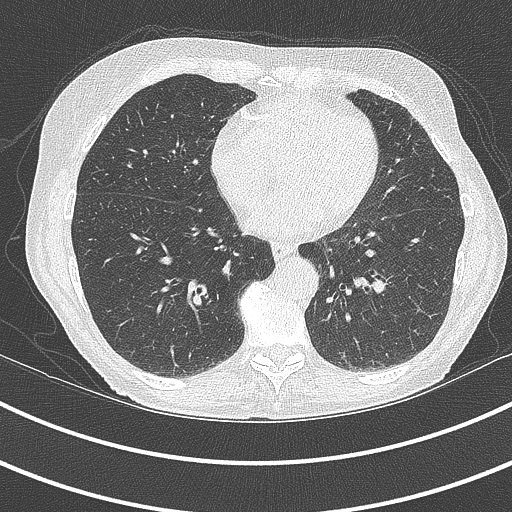
[im 133/292  lung]
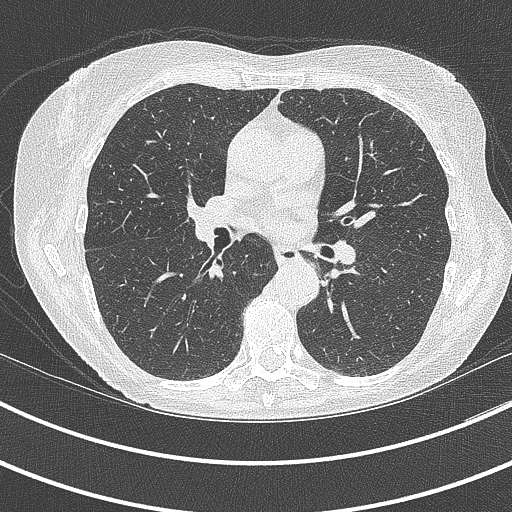
[im 159/292  lung]
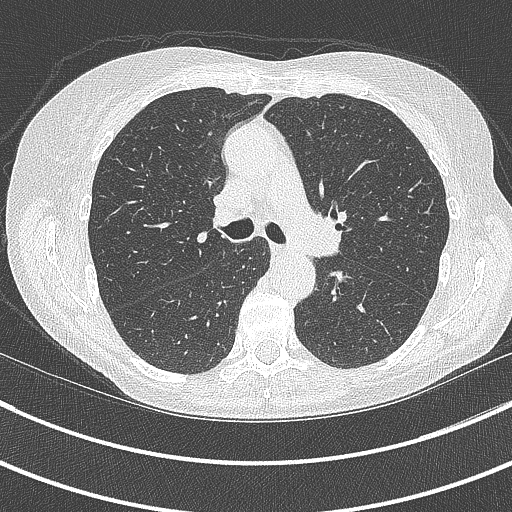
[im 199/292  lung]
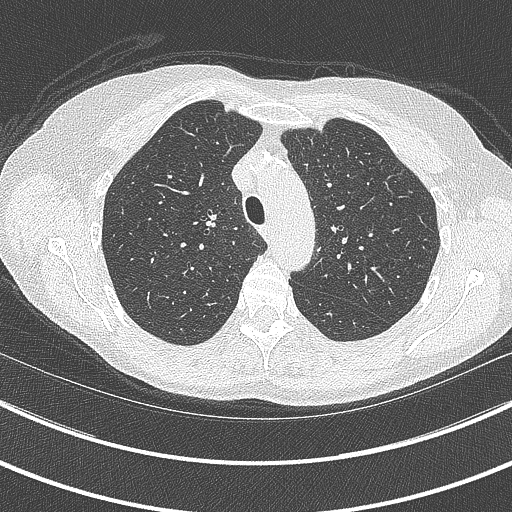
[im 239/292  lung]
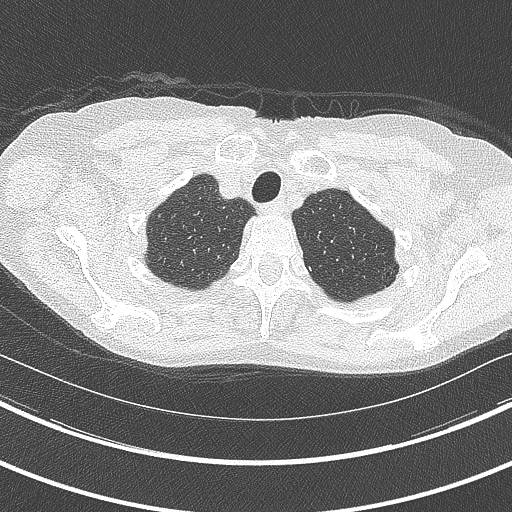
[im 265/292  lung]
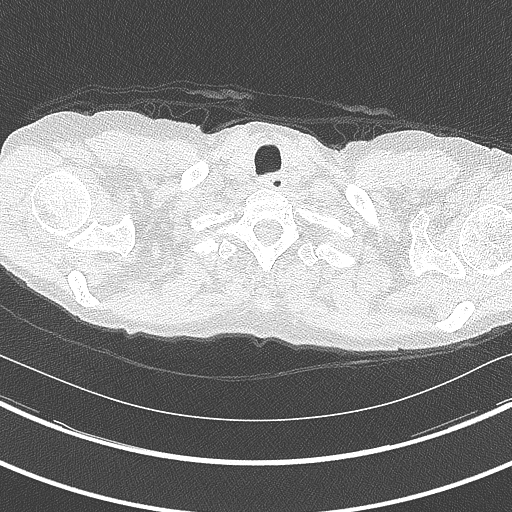

[14 of 36 positions shown; findings below may reference images not displayed]

FINDINGS: Cardiovascular: Heart size is normal. There is no significant
pericardial fluid, thickening or pericardial calcification. There is
aortic atherosclerosis, as well as atherosclerosis of the great
vessels of the mediastinum and the coronary arteries, including
calcified atherosclerotic plaque in the left main and right coronary
arteries.

Mediastinum/Nodes: No pathologically enlarged mediastinal or hilar
lymph nodes. Please note that accurate exclusion of hilar adenopathy
is limited on noncontrast CT scans. Esophagus is unremarkable in
appearance. No axillary lymphadenopathy.

Lungs/Pleura: Previously noted nodule of concern in the right lower
lobe has regressed, currently measuring only 3.1 mm, likely a small
amount of mucoid impaction within a bronchus. Other previously noted
pulmonary nodules are similar in size, number and pattern of
distribution. No other new suspicious appearing pulmonary nodules or
masses are noted. No acute consolidative airspace disease. No
pleural effusions. Mild diffuse bronchial wall thickening with mild
centrilobular and paraseptal emphysema.

Upper Abdomen: Aortic atherosclerosis. Low-attenuation adreniform
thickening of both adrenal glands, similar to prior studies, most
compatible with adenomatous hyperplasia.

Musculoskeletal: Focal architectural distortion and dense
calcifications in the lateral aspect of the left breast, potentially
fat necrosis at site of prior lumpectomy. There are no aggressive
appearing lytic or blastic lesions noted in the visualized portions
of the skeleton.
IMPRESSION: 1. Lung-RADS 2S, benign appearance or behavior. Continue annual
screening with low-dose chest CT without contrast in 12 months.
2. The "S" modifier above refers to potentially clinically
significant non lung cancer related findings. Specifically, there is
aortic atherosclerosis, in addition to left main and right coronary
artery disease. Please note that although the presence of coronary
artery calcium documents the presence of coronary artery disease,
the severity of this disease and any potential stenosis cannot be
assessed on this non-gated CT examination. Assessment for potential
risk factor modification, dietary therapy or pharmacologic therapy
may be warranted, if clinically indicated.
3. Mild diffuse bronchial wall thickening with mild centrilobular
and paraseptal emphysema; imaging findings suggestive of underlying
COPD.

Aortic Atherosclerosis (8H93T-43H.H) and Emphysema (8H93T-053.J).

## 2021-12-27 ENCOUNTER — Other Ambulatory Visit: Payer: Self-pay | Admitting: Family Medicine

## 2022-01-03 ENCOUNTER — Encounter: Payer: Self-pay | Admitting: Gastroenterology

## 2022-01-03 ENCOUNTER — Ambulatory Visit (INDEPENDENT_AMBULATORY_CARE_PROVIDER_SITE_OTHER): Payer: Medicare Other | Admitting: Gastroenterology

## 2022-01-03 DIAGNOSIS — D509 Iron deficiency anemia, unspecified: Secondary | ICD-10-CM | POA: Diagnosis not present

## 2022-01-03 NOTE — Progress Notes (Signed)
Capsule ID: [id number] ?Exp: 02/06/2023 ?LOT: [id number] ? ?Patient arrived for VCE. Reported the prep went well. This RN explained capsule dietary restrictions for the next few hours. Pt advised to return at 4 pm to return capsule equipment.  Patient verbalized understanding. Opened capsule, ensured capsule was flashing prior to the patient swallowing the capsule. Patient swallowed capsule without difficulty.  Patient told to call the office with any questions and if capsule has not passed after 72 hours. No further questions by the conclusion of the visit.   ?

## 2022-01-03 NOTE — Patient Instructions (Signed)
Contact our office immediately at [phone] if you suffer from any abdominal pain, nausea, or vomiting during capsule endoscopy. °a) Do not eat or drink for at least 2 hours. °After 2 hours you may have any of the following to drink: °Water   White grape juice °7-Up   Chicken Bouillon °Sprite   Ginger Ale °c) After 4 hours you may have a light snack to include any of the following: °A cup of soup  ½ sandwich °Bowl of cereal  Rice °Toast   Eggs °2-3 small cookies (i.e. vanilla wafers or graham crackers) °d) After 8 hours you may return to your regular diet. °During your procedure do not go near anyone else that is having capsule endoscopy. °Do not be in close contact with an MRI machine or a radio or television tower. °Do not wear a heavy coat or sweater because your recorder may over heat and stop recording.   °Do not disconnect the equipment or remove the belt at any time.  Since the Data Recorder is actually a small computer, it should be treated with utmost care and protection.  Avoid sudden movement and banging of the Data Recorder.  Do not do any heavy lifting or strenuous physical activity during the test especially if it involves sweating and do not bend over or stoop during capsule endoscopy. °During capsule endoscopy, you will need to verify every 15 minutes that the small light on top of the Data Recorder is blinking twice per second.  If for some reason it stops blinking at this site, record the time and contact our office at [phone]. ° ° ° °

## 2022-01-12 ENCOUNTER — Telehealth: Payer: Self-pay

## 2022-01-12 NOTE — Telephone Encounter (Signed)
-----   Message from Irving Copas., MD sent at 01/12/2022  2:28 PM EDT ----- ?Regarding: RE: capsule ?AE, ?Thank you for this update. ?Sorry to hear that it ended up staying in the stomach. ?Since she has normalized her blood counts, I think we can wait on repeat capsule performance (likely need to perform and place with an EGD since she has had this end up being retained for the next time). ?If she has not noted passing, then yes please go ahead and get a 2 view KUB. ?Thanks. ?GM ?----- Message ----- ?From: Alfredia Ferguson, PA-C ?Sent: 01/12/2022  11:52 AM EDT ?To: Timothy Lasso, RN, Jerene Bears, MD, # ?Subject: capsule                                       ? ?Capsule ready on this pt - it was Incomplete - capsule retained in the stomach for the entire study  ? ?Dr Hilarie Fredrickson - please sign reports - in your folder. ? ?Dr Rush Landmark- [place]- as your pt ? ?Shiloh Swopes - please call pt and find out if she has passed the capsule  -study was done 01/03/22- if she has not , or is uncertain have her come in for KUB ? ?Thanks ? ? ?

## 2022-01-12 NOTE — Telephone Encounter (Signed)
Thanks. GM 

## 2022-01-12 NOTE — Telephone Encounter (Signed)
I called and spoke with the pt and she passed the capsule the day after she ingested. She is sure that is was the capsule because it was still flashing.  ?

## 2022-01-12 NOTE — Telephone Encounter (Signed)
Dr Rush Landmark the pt did pass the capsule, she is doing well.   ?

## 2022-01-12 NOTE — Telephone Encounter (Signed)
-----   Message from Alfredia Ferguson, PA-C sent at 01/12/2022 11:48 AM EDT ----- ?Regarding: capsule ?Capsule ready on this pt - it was Incomplete - capsule retained in the stomach for the entire study  ? ?Dr Hilarie Fredrickson - please sign reports - in your folder. ? ?Dr Rush Landmark- [place]- as your pt ? ?[name] - please call pt and find out if she has passed the capsule  -study was done 01/03/22- if she has not , or is uncertain have her come in for KUB ? ?Thanks ? ?

## 2022-01-25 ENCOUNTER — Other Ambulatory Visit: Payer: Self-pay | Admitting: Family Medicine

## 2022-01-25 ENCOUNTER — Other Ambulatory Visit: Payer: Self-pay

## 2022-01-30 ENCOUNTER — Other Ambulatory Visit: Payer: Self-pay | Admitting: Family Medicine

## 2022-02-10 ENCOUNTER — Other Ambulatory Visit: Payer: Self-pay

## 2022-02-10 MED ORDER — ESOMEPRAZOLE MAGNESIUM 40 MG PO CPDR: 40.0000 mg | DELAYED_RELEASE_CAPSULE | Freq: Every day | ORAL | 1 refills | Status: DC

## 2022-02-10 NOTE — Telephone Encounter (Signed)
Generic Nexium refilled as patient requested. ?

## 2022-03-01 ENCOUNTER — Other Ambulatory Visit: Payer: Self-pay | Admitting: *Deleted

## 2022-03-01 DIAGNOSIS — I6523 Occlusion and stenosis of bilateral carotid arteries: Secondary | ICD-10-CM

## 2022-03-01 NOTE — Progress Notes (Signed)
HISTORY AND PHYSICAL     CC:  follow up. Requesting Provider:  [name], MD  HPI: This is a 66 y.o. female here for follow up for carotid artery stenosis.  She has been followed for bilateral carotid stenosis in the setting of fibromuscular dysplasia.  In 2009, she had a syncopal episode and was evaluated and this was inconclusive for stroke or TIA.  She has hx of ESI in the past.   Pt was last seen June 2022 and at that time she was not having monocular blindness, slurred speech, facial drooping, unilateral extremity weakness or numbness.  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   Pt returns today for follow up.    Pt denies any amaurosis fugax, speech difficulties, weakness, numbness, paralysis or clumsiness or facial droop.    She states she has had some bleeding issues that they are still trying to sort out.  She is not taking asa for that reason.  She has had 3 colonoscopies in the past year.    The pt is on a statin for cholesterol management.  The pt is not on a daily aspirin.   Other AC:  none The pt is not on medication for hypertension.   The pt does not have diabetes Tobacco hx:  current  Pt does not have family hx of AAA.  Past Medical History:  Diagnosis Date   ADHD (attention deficit hyperactivity disorder) 09/07/2011   Anesthesia complication 25/00/3704   Will need GETA for future endoscopy procedures. Did not tolerate MAC well.   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   Diverticulosis    DVT (deep venous thrombosis) ([place]) 09/07/2011   Hypertension    on no medications   Hypothyroid 09/07/2011   Internal hemorrhoids    Iron deficiency anemia    Personal history of radiation therapy 2012   Pneumonia    Smoker 02/17/2013   Tubular adenoma of colon     Past  Surgical History:  Procedure Laterality Date   ABDOMINAL HYSTERECTOMY     BIOPSY  12/04/2019   Procedure: BIOPSY;  Surgeon: Juanita Craver, MD;  Location: [facility];  Service: Endoscopy;;   BIOPSY  06/14/2021   Procedure: BIOPSY;  Surgeon: Irving Copas., MD;  Location: [facility];  Service: Gastroenterology;;   BREAST BIOPSY Left 2013   BREAST LUMPECTOMY  12/2011   Left breast   COLONOSCOPY     COLONOSCOPY WITH PROPOFOL N/A 12/04/2019   Procedure: COLONOSCOPY WITH PROPOFOL;  Surgeon: Juanita Craver, MD;  Location: [facility];  Service: Endoscopy;  Laterality: N/A;   COLONOSCOPY WITH PROPOFOL N/A 09/20/2020   Procedure: COLONOSCOPY WITH PROPOFOL;  Surgeon: Rush Landmark Telford Nab., MD;  Location: [facility];  Service: Gastroenterology;  Laterality: N/A;   COLONOSCOPY WITH PROPOFOL N/A 06/14/2021   Procedure: COLONOSCOPY WITH PROPOFOL;  Surgeon: Rush Landmark Telford Nab., MD;  Location: [facility];  Service: Gastroenterology;  Laterality: N/A;   ENDOSCOPIC MUCOSAL RESECTION N/A 09/20/2020   Procedure: ENDOSCOPIC MUCOSAL RESECTION;  Surgeon: Rush Landmark Telford Nab., MD;  Location: [facility];  Service: Gastroenterology;  Laterality: N/A;   ESOPHAGOGASTRODUODENOSCOPY (EGD) WITH PROPOFOL N/A 12/04/2019   Procedure: ESOPHAGOGASTRODUODENOSCOPY (EGD) WITH PROPOFOL;  Surgeon: Juanita Craver, MD;  Location: [facility];  Service: Endoscopy;  Laterality: N/A;  EYE SURGERY Right 06/2013   Lasix - Laser   HEMOSTASIS CLIP PLACEMENT  09/20/2020   Procedure: HEMOSTASIS CLIP PLACEMENT;  Surgeon: Irving Copas., MD;  Location: [facility];  Service: Gastroenterology;;   POLYPECTOMY  12/04/2019   Procedure: POLYPECTOMY;  Surgeon: Juanita Craver, MD;  Location: [facility];  Service: Endoscopy;;   POLYPECTOMY  09/20/2020   Procedure: POLYPECTOMY;  Surgeon: Irving Copas., MD;  Location: [place];  Service: Gastroenterology;;   POLYPECTOMY  06/14/2021   Procedure:  POLYPECTOMY;  Surgeon: Irving Copas., MD;  Location: [facility];  Service: Gastroenterology;;   SUBMUCOSAL LIFTING INJECTION  09/20/2020   Procedure: SUBMUCOSAL LIFTING INJECTION;  Surgeon: Irving Copas., MD;  Location: [facility];  Service: Gastroenterology;;   UPPER GASTROINTESTINAL ENDOSCOPY      Allergies  Allergen Reactions   Penicillins Hives    Did it involve swelling of the face/tongue/throat, SOB, or low BP? No Did it involve sudden or severe rash/hives, skin peeling, or any reaction on the inside of your mouth or nose? No Did you need to seek medical attention at a hospital or doctor's office? Yes When did it last happen?      35 years If all above answers are "NO", may proceed with cephalosporin use.    Current Outpatient Medications  Medication Sig Dispense Refill   acetaminophen (TYLENOL) 650 MG CR tablet Take 650 mg by mouth every 8 (eight) hours as needed for pain.     amphetamine-dextroamphetamine (ADDERALL) 10 MG tablet Take 1 tablet (10 mg total) by mouth 2 (two) times daily. 60 tablet 0   B Complex-C (B-COMPLEX WITH VITAMIN C) tablet Take 1 tablet by mouth daily.     bisacodyl (DULCOLAX) 5 MG EC tablet Take 5 mg by mouth in the morning.     cholecalciferol (VITAMIN D) 1000 UNITS tablet Take 1,000 Units by mouth daily.     esomeprazole (NEXIUM) 40 MG capsule Take 1 capsule (40 mg total) by mouth daily. 90 capsule 1   levothyroxine (SYNTHROID) 75 MCG tablet TAKE 1 TABLET DAILY BEFORE BREAKFAST 90 tablet 3   Multiple Vitamin (MULTIVITAMIN WITH MINERALS) TABS tablet Take 1 tablet by mouth daily. Centrum Silver     naproxen (NAPROSYN) 375 MG tablet TAKE 1 TABLET TWICE A DAY WITH MEALS 180 tablet 3   polycarbophil (FIBERCON) 625 MG tablet Take 625 mg by mouth daily.     rosuvastatin (CRESTOR) 20 MG tablet TAKE 1 TABLET DAILY (NEED APPOINTMENT) 90 tablet 3   No current facility-administered medications for this visit.    Family History  Problem  Relation Age of Onset   Stomach cancer Father    Esophageal cancer Father    Diabetes Maternal Grandmother    Crohn's disease Daughter    Ulcerative colitis Daughter    Colon cancer Neg Hx    Pancreatic cancer Neg Hx    Liver disease Neg Hx    Inflammatory bowel disease Neg Hx    Rectal cancer Neg Hx     Social History   Socioeconomic History   Marital status: Divorced    Spouse name: Not on file   Number of children: Not on file   Years of education: Not on file   Highest education level: Not on file  Occupational History   Not on file  Tobacco Use   Smoking status: Every Day    Packs/day: 1.00    Years: 40.00    Pack years: 40.00    Types: E-cigarettes, Cigarettes  Smokeless tobacco: Never  Vaping Use   Vaping Use: Never used  Substance and Sexual Activity   Alcohol use: Yes    Alcohol/week: 0.0 standard drinks    Comment: 4 mixed drinks per week    Drug use: No   Sexual activity: Never  Other Topics Concern   Not on file  Social History Narrative   Not on file   Social Determinants of Health   Financial Resource Strain: Not on file  Food Insecurity: Not on file  Transportation Needs: Not on file  Physical Activity: Not on file  Stress: Not on file  Social Connections: Not on file  Intimate Partner Violence: Not on file     REVIEW OF SYSTEMS:   _0  denotes positive finding, _1  denotes negative finding Cardiac  Comments:  Chest pain or chest pressure:    Shortness of breath upon exertion:    Short of breath when lying flat:    Irregular heart rhythm:        Vascular    Pain in calf, thigh, or hip brought on by ambulation:    Pain in feet at night that wakes you up from your sleep:     Blood clot in your veins:    Leg swelling:         Pulmonary    Oxygen at home:    Productive cough:     Wheezing:         Neurologic    Sudden weakness in arms or legs:     Sudden numbness in arms or legs:     Sudden onset of difficulty speaking or  slurred speech:    Temporary loss of vision in one eye:     Problems with dizziness:         Gastrointestinal    Blood in stool:     Vomited blood:         Genitourinary    Burning when urinating:     Blood in urine:        Psychiatric    Major depression:         Hematologic    Bleeding problems: x See HPI  Problems with blood clotting too easily:        Skin    Rashes or ulcers:        Constitutional    Fever or chills:      PHYSICAL EXAMINATION:  Today's Vitals   03/09/22 0939 03/09/22 0940  BP: (!) 157/95 (!) 142/89  Pulse: 77   Resp: 20   Temp: 97.7 F (36.5 C)   TempSrc: Oral   SpO2: 98%   Weight: 119 lb 12.8 oz (54.3 kg)   Height: _2  (1.651 m)    Body mass index is 19.94 kg/m.   General:  WDWN in NAD; vital signs documented above Gait: Not observed HENT: WNL, normocephalic Pulmonary: normal non-labored breathing Cardiac: regular HR, without carotid bruits Abdomen: soft, NT; aortic pulse is not palpable Skin: without rashes Vascular Exam/Pulses:  Right Left  Radial 2+ (normal) 2+ (normal)  DP Unable to palpate  2+ (normal)  PT 2+ (normal) 2+ (normal)   Extremities: without ischemic changes, without Gangrene , without cellulitis; without open wounds Musculoskeletal: no muscle wasting or atrophy  Neurologic: A&O X 3; moving all extremities equally; speech is fluent/normal Psychiatric:  The pt has Normal affect.   Non-Invasive Vascular Imaging:   Carotid Duplex on 03/09/2022 Right Carotid: The extracranial vessels were near-normal with only minimal  wall thickening or plaque.   Left Carotid: The extracranial vessels were near-normal with only minimal  wall thickening or plaque.   Vertebrals:  Bilateral vertebral arteries demonstrate antegrade flow.  Subclavians: Normal flow hemodynamics were seen in bilateral subclavian  arteries.   Previous Carotid duplex on 03/08/2021: Right: 40-59% ICA stenosis Left:   40-59% ICA  stenosis    ASSESSMENT/PLAN:: 66 y.o. female here for follow up carotid artery stenosis  -duplex today reveals near normal with only minimal wall thickening or plaque bilaterally.  She remains asymptomatic -discussed s/s of stroke with pt and she understands should she develop any of these sx, she will go to the nearest ER or call 911. -pt will f/u in one year with carotid duplex -pt will call sooner should they have any issues. -continue statin.  She is not on asa due to internal bleeding -discussed the importance of smoking cessation    Leontine Locket, [facility] [phone]  Clinic MD:  Scot Dock

## 2022-03-05 ENCOUNTER — Encounter: Payer: Self-pay | Admitting: Family Medicine

## 2022-03-09 ENCOUNTER — Ambulatory Visit (HOSPITAL_COMMUNITY)
Admission: RE | Admit: 2022-03-09 | Discharge: 2022-03-09 | Disposition: A | Discharge status: Home / Self Care | Payer: Medicare Other | Source: Ambulatory Visit | Attending: Surgery | Admitting: Surgery

## 2022-03-09 ENCOUNTER — Ambulatory Visit (INDEPENDENT_AMBULATORY_CARE_PROVIDER_SITE_OTHER): Payer: Medicare Other | Admitting: Physician Assistant

## 2022-03-09 VITALS — BP 142/89 | HR 77 | Temp 97.7°F | Resp 20 | Ht 65.0 in | Wt 119.8 lb

## 2022-03-09 DIAGNOSIS — I6523 Occlusion and stenosis of bilateral carotid arteries: Secondary | ICD-10-CM

## 2022-03-09 DIAGNOSIS — I2510 Atherosclerotic heart disease of native coronary artery without angina pectoris: Secondary | ICD-10-CM

## 2022-03-09 NOTE — Progress Notes (Signed)
Carotid artery duplex completed. Refer to "CV Proc" under chart review to view preliminary results.  03/09/2022 9:21 AM Kelby Aline., MHA, RVT, RDCS, RDMS

## 2022-03-11 ENCOUNTER — Encounter: Payer: Self-pay | Admitting: Family Medicine

## 2022-03-13 ENCOUNTER — Other Ambulatory Visit: Payer: Self-pay | Admitting: Family Medicine

## 2022-03-13 MED ORDER — AMPHETAMINE-DEXTROAMPHETAMINE 10 MG PO TABS: 10.0000 mg | ORAL_TABLET | Freq: Two times a day (BID) | ORAL | 0 refills | Status: DC

## 2022-03-14 ENCOUNTER — Other Ambulatory Visit: Payer: Self-pay

## 2022-03-14 ENCOUNTER — Other Ambulatory Visit: Payer: Medicare Other

## 2022-03-14 DIAGNOSIS — D509 Iron deficiency anemia, unspecified: Secondary | ICD-10-CM

## 2022-03-15 LAB — IRON,TIBC AND FERRITIN PANEL
%SAT: 33 % (calc) (ref 16–45)
Ferritin: 13 ng/mL — ABNORMAL LOW (ref 16–288)
Iron: 136 ug/dL (ref 45–160)
TIBC: 414 mcg/dL (calc) (ref 250–450)

## 2022-03-16 ENCOUNTER — Other Ambulatory Visit: Payer: Medicare Other

## 2022-03-16 ENCOUNTER — Other Ambulatory Visit: Payer: Self-pay

## 2022-03-16 DIAGNOSIS — D508 Other iron deficiency anemias: Secondary | ICD-10-CM

## 2022-03-16 DIAGNOSIS — D509 Iron deficiency anemia, unspecified: Secondary | ICD-10-CM

## 2022-03-16 LAB — CBC WITH DIFFERENTIAL/PLATELET
Absolute Monocytes: 651 cells/uL (ref 200–950)
Basophils Absolute: 62 cells/uL (ref 0–200)
Basophils Relative: 0.7 %
Eosinophils Absolute: 185 cells/uL (ref 15–500)
Eosinophils Relative: 2.1 %
HCT: 39.4 % (ref 35.0–45.0)
Hemoglobin: 13.6 g/dL (ref 11.7–15.5)
Lymphs Abs: 1382 cells/uL (ref 850–3900)
MCH: 33.4 pg — ABNORMAL HIGH (ref 27.0–33.0)
MCHC: 34.5 g/dL (ref 32.0–36.0)
MCV: 96.8 fL (ref 80.0–100.0)
MPV: 10.6 fL (ref 7.5–12.5)
Monocytes Relative: 7.4 %
Neutro Abs: 6521 cells/uL (ref 1500–7800)
Neutrophils Relative %: 74.1 %
Platelets: 511 10*3/uL — ABNORMAL HIGH (ref 140–400)
RBC: 4.07 10*6/uL (ref 3.80–5.10)
RDW: 11.9 % (ref 11.0–15.0)
Total Lymphocyte: 15.7 %
WBC: 8.8 10*3/uL (ref 3.8–10.8)

## 2022-03-17 LAB — FECAL GLOBIN BY IMMUNOCHEMISTRY
FECAL GLOBIN RESULT:: DETECTED — AB
MICRO NUMBER:: 13530180
SPECIMEN QUALITY:: ADEQUATE

## 2022-03-22 ENCOUNTER — Other Ambulatory Visit: Payer: Self-pay

## 2022-03-22 NOTE — Progress Notes (Signed)
Enter as error

## 2022-03-27 ENCOUNTER — Encounter: Payer: Self-pay | Admitting: Gastroenterology

## 2022-04-05 NOTE — Telephone Encounter (Signed)
[place] or [place], Thanks for sending me this. I guess I never got the results forwarded to me because I did not make any notation in the chart as to the next steps in her evaluation. Please apologize for this timing and wait. Please let the patient know that I have reviewed her labs, and she has no evidence of anemia and her iron studies are much improved.  I do see that her fecal occult test did return positive which may still distinguish potential blood loss microscopically in the GI tract. Since the video capsule endoscopy remained in the stomach the entire time, the only way to further evaluate the rest of her small bowel will be to proceed with an EGD and place a video capsule endoscopy directly. With the patient having normalized her labs and no longer showing anemia, we can do one of the following options: 1) see the patient in clinic and discuss potential monitoring and surveillance of just her labs +/- VCE 2) proceed with scheduling EGD with video capsule endoscopy placement next available with me in the [place] versus hospital based setting (not sure if we can place these endoscopically here in the [place] are not) Let me know what she decides. GM

## 2022-04-06 ENCOUNTER — Telehealth: Payer: Self-pay

## 2022-04-06 ENCOUNTER — Other Ambulatory Visit: Payer: Self-pay

## 2022-04-06 DIAGNOSIS — K259 Gastric ulcer, unspecified as acute or chronic, without hemorrhage or perforation: Secondary | ICD-10-CM

## 2022-04-06 NOTE — Telephone Encounter (Signed)
EGD capsule placement 06/01/22 at 1130 am [facility] with GM   Left message on machine to call back

## 2022-04-06 NOTE — Telephone Encounter (Signed)
EGD scheduled, pt instructed and medications reviewed.  Patient instructions mailed to home and sent to my chart.  Patient to call with any questions or concerns.

## 2022-04-06 NOTE — Telephone Encounter (Signed)
Dr Rush Landmark see the message from the pt- she has decided to have EGD with video capsule placement.  Please advise

## 2022-04-06 NOTE — Telephone Encounter (Signed)
[name], Please set her up for next available EGD with video capsule placement.  If we can do it in the [place] that would be great if we have to do it in the hospital, she can be scheduled next available or be put on my hospital week to be done.  I also believe that we had that EUS cancellation for a procedure in the next 1 to 2 weeks so she may be able to fit there if we have to do it in the hospital. Thanks. GM

## 2022-04-06 NOTE — Telephone Encounter (Signed)
[name], Please set her up for next available EGD with video capsule placement.  If we can do it in the [place] that would be great if we have to do it in the hospital, she can be scheduled next available or be put on my hospital week to be done.  I also believe that we had that EUS cancellation for a procedure in the next 1 to 2 weeks so she may be able to fit there if we have to do it in the hospital. Thanks. GM

## 2022-04-13 ENCOUNTER — Encounter: Payer: Self-pay | Admitting: Gastroenterology

## 2022-04-13 NOTE — Telephone Encounter (Signed)
Dr Rush Landmark have you seen any forms?

## 2022-04-14 NOTE — Telephone Encounter (Signed)
Patty, I have seen the forms. I need to review her chart and will make some determination of these next week. Thanks. GM

## 2022-04-18 ENCOUNTER — Encounter: Payer: Self-pay | Admitting: Family Medicine

## 2022-04-18 MED ORDER — AMPHETAMINE-DEXTROAMPHETAMINE 10 MG PO TABS: 10.0000 mg | ORAL_TABLET | Freq: Two times a day (BID) | ORAL | 0 refills | Status: DC

## 2022-04-18 NOTE — Addendum Note (Signed)
Addended by: Colman Cater on: 04/18/2022 02:06 PM   Modules accepted: Orders

## 2022-04-18 NOTE — Telephone Encounter (Signed)
I have filled the forms and given this to the medical assistants. Thanks. GM

## 2022-04-18 NOTE — Telephone Encounter (Signed)
LOV 11/10/21 Last refill 03/13/22, #60, 0 refills  Please review, thanks!

## 2022-04-21 ENCOUNTER — Encounter: Payer: Self-pay | Admitting: Family Medicine

## 2022-04-24 ENCOUNTER — Other Ambulatory Visit: Payer: Self-pay

## 2022-04-24 MED ORDER — AMPHETAMINE-DEXTROAMPHETAMINE 10 MG PO TABS: 10.0000 mg | ORAL_TABLET | Freq: Two times a day (BID) | ORAL | 0 refills | Status: DC

## 2022-04-24 NOTE — Telephone Encounter (Signed)
Pls resend   LOV 11/10/21 Last refill 03/13/22, #60, 0 refills   Please review, thanks!

## 2022-05-24 ENCOUNTER — Encounter: Payer: Self-pay | Admitting: Gastroenterology

## 2022-05-24 ENCOUNTER — Encounter: Payer: Self-pay | Admitting: Family Medicine

## 2022-05-24 NOTE — Telephone Encounter (Signed)
LOV 11/10/21 Last refill 04/24/22, #60, 0 refills  Please review, thanks!

## 2022-05-25 ENCOUNTER — Encounter (HOSPITAL_COMMUNITY): Payer: Self-pay | Admitting: Gastroenterology

## 2022-05-25 MED ORDER — AMPHETAMINE-DEXTROAMPHETAMINE 10 MG PO TABS: 10.0000 mg | ORAL_TABLET | Freq: Two times a day (BID) | ORAL | 0 refills | Status: DC

## 2022-05-31 NOTE — Anesthesia Preprocedure Evaluation (Signed)
Anesthesia Evaluation  Patient identified by MRN, date of birth, ID band Patient awake    Reviewed: Allergy & Precautions, Patient's Chart, lab work & pertinent test results  Airway        Dental   Pulmonary Current Smoker and Patient abstained from smoking.,           Cardiovascular hypertension, + Peripheral Vascular Disease       Neuro/Psych    GI/Hepatic Neg liver ROS, GERD  Medicated,  Endo/Other  Hypothyroidism   Renal/GU negative Renal ROS     Musculoskeletal   Abdominal   Peds  Hematology   Anesthesia Other Findings   Reproductive/Obstetrics                             Anesthesia Physical  Anesthesia Plan  ASA: II  Anesthesia Plan: MAC   Post-op Pain Management:    Induction:   PONV Risk Score and Plan: 1 and Propofol infusion  Airway Management Planned: Natural Airway and Mask  Additional Equipment: None  Intra-op Plan:   Post-operative Plan:   Informed Consent:   Plan Discussed with:   Anesthesia Plan Comments:         Anesthesia Quick Evaluation

## 2022-06-01 ENCOUNTER — Other Ambulatory Visit: Payer: Self-pay

## 2022-06-01 ENCOUNTER — Encounter (HOSPITAL_COMMUNITY)
Admission: RE | Disposition: A | Discharge status: Home / Self Care | Payer: Self-pay | Source: Home / Self Care | Attending: Gastroenterology

## 2022-06-01 ENCOUNTER — Encounter (HOSPITAL_COMMUNITY): Payer: Self-pay | Admitting: Gastroenterology

## 2022-06-01 ENCOUNTER — Ambulatory Visit (HOSPITAL_COMMUNITY): Payer: Medicare Other | Admitting: Anesthesiology

## 2022-06-01 ENCOUNTER — Ambulatory Visit (HOSPITAL_COMMUNITY)
Admission: RE | Admit: 2022-06-01 | Discharge: 2022-06-01 | Disposition: A | Discharge status: Home / Self Care | Payer: Medicare Other | Attending: Gastroenterology | Admitting: Gastroenterology

## 2022-06-01 ENCOUNTER — Ambulatory Visit (HOSPITAL_BASED_OUTPATIENT_CLINIC_OR_DEPARTMENT_OTHER): Payer: Medicare Other | Admitting: Anesthesiology

## 2022-06-01 DIAGNOSIS — K2289 Other specified disease of esophagus: Secondary | ICD-10-CM | POA: Insufficient documentation

## 2022-06-01 DIAGNOSIS — K449 Diaphragmatic hernia without obstruction or gangrene: Secondary | ICD-10-CM | POA: Insufficient documentation

## 2022-06-01 DIAGNOSIS — I10 Essential (primary) hypertension: Secondary | ICD-10-CM | POA: Diagnosis not present

## 2022-06-01 DIAGNOSIS — F17290 Nicotine dependence, other tobacco product, uncomplicated: Secondary | ICD-10-CM | POA: Diagnosis not present

## 2022-06-01 DIAGNOSIS — K31819 Angiodysplasia of stomach and duodenum without bleeding: Secondary | ICD-10-CM

## 2022-06-01 DIAGNOSIS — F17210 Nicotine dependence, cigarettes, uncomplicated: Secondary | ICD-10-CM

## 2022-06-01 DIAGNOSIS — K259 Gastric ulcer, unspecified as acute or chronic, without hemorrhage or perforation: Secondary | ICD-10-CM

## 2022-06-01 DIAGNOSIS — D509 Iron deficiency anemia, unspecified: Secondary | ICD-10-CM | POA: Insufficient documentation

## 2022-06-01 DIAGNOSIS — K5521 Angiodysplasia of colon with hemorrhage: Secondary | ICD-10-CM

## 2022-06-01 DIAGNOSIS — K219 Gastro-esophageal reflux disease without esophagitis: Secondary | ICD-10-CM | POA: Insufficient documentation

## 2022-06-01 HISTORY — PX: HOT HEMOSTASIS: SHX5433

## 2022-06-01 HISTORY — PX: ESOPHAGOGASTRODUODENOSCOPY (EGD) WITH PROPOFOL: SHX5813

## 2022-06-01 HISTORY — PX: GIVENS CAPSULE STUDY: SHX5432

## 2022-06-01 SURGERY — ESOPHAGOGASTRODUODENOSCOPY (EGD) WITH PROPOFOL
Anesthesia: Monitor Anesthesia Care

## 2022-06-01 MED ORDER — LACTATED RINGERS IV SOLN: INTRAVENOUS | Status: DC

## 2022-06-01 MED ORDER — LIDOCAINE 2% (20 MG/ML) 5 ML SYRINGE
INTRAMUSCULAR | Status: DC | PRN
  Administered 2022-06-01: 100 mg via INTRAVENOUS

## 2022-06-01 MED ORDER — PROPOFOL 10 MG/ML IV BOLUS
INTRAVENOUS | Status: AC
  Filled 2022-06-01: qty 20

## 2022-06-01 MED ORDER — SODIUM CHLORIDE 0.9 % IV SOLN: INTRAVENOUS | Status: DC

## 2022-06-01 MED ORDER — PROPOFOL 10 MG/ML IV BOLUS
INTRAVENOUS | Status: DC | PRN
  Administered 2022-06-01: 20 mg via INTRAVENOUS
  Administered 2022-06-01: 40 mg via INTRAVENOUS
  Administered 2022-06-01 (×2): 30 mg via INTRAVENOUS
  Administered 2022-06-01: 10 mg via INTRAVENOUS
  Administered 2022-06-01 (×2): 20 mg via INTRAVENOUS
  Administered 2022-06-01: 30 mg via INTRAVENOUS

## 2022-06-01 MED ORDER — PROPOFOL 500 MG/50ML IV EMUL
INTRAVENOUS | Status: DC | PRN
  Administered 2022-06-01: 120 ug/kg/min via INTRAVENOUS

## 2022-06-01 MED ORDER — PROPOFOL 10 MG/ML IV BOLUS: INTRAVENOUS | Status: DC | PRN

## 2022-06-01 MED ORDER — DEXMEDETOMIDINE (PRECEDEX) IN NS 20 MCG/5ML (4 MCG/ML) IV SYRINGE
PREFILLED_SYRINGE | INTRAVENOUS | Status: DC | PRN
  Administered 2022-06-01: 4 ug via INTRAVENOUS
  Administered 2022-06-01: 12 ug via INTRAVENOUS

## 2022-06-01 SURGICAL SUPPLY — 16 items
BLOCK BITE 60FR ADLT L/F BLUE (MISCELLANEOUS) ×1 IMPLANT
ELECT REM PT RETURN 9FT ADLT (ELECTROSURGICAL)
ELECTRODE REM PT RTRN 9FT ADLT (ELECTROSURGICAL) IMPLANT
FORCEP RJ3 GP 1.8X160 W-NEEDLE (CUTTING FORCEPS) IMPLANT
FORCEPS BIOP RAD 4 LRG CAP 4 (CUTTING FORCEPS) IMPLANT
NDL SCLEROTHERAPY 25GX240 (NEEDLE) IMPLANT
NEEDLE SCLEROTHERAPY 25GX240 (NEEDLE) IMPLANT
PROBE APC STR FIRE (PROBE) IMPLANT
PROBE INJECTION GOLD (MISCELLANEOUS)
PROBE INJECTION GOLD 7FR (MISCELLANEOUS) IMPLANT
SNARE SHORT THROW 13M SML OVAL (MISCELLANEOUS) IMPLANT
SYR 50ML LL SCALE MARK (SYRINGE) IMPLANT
TOWEL COTTON PACK 4EA (MISCELLANEOUS) ×2 IMPLANT
TUBING ENDO SMARTCAP PENTAX (MISCELLANEOUS) ×2 IMPLANT
TUBING IRRIGATION ENDOGATOR (MISCELLANEOUS) ×1 IMPLANT
WATER STERILE IRR 1000ML POUR (IV SOLUTION) IMPLANT

## 2022-06-01 NOTE — Anesthesia Postprocedure Evaluation (Signed)
Anesthesia Post Note  Patient: [name]  Procedure(s) Performed: ESOPHAGOGASTRODUODENOSCOPY (EGD) WITH PROPOFOL GIVENS CAPSULE STUDY HOT HEMOSTASIS (ARGON PLASMA COAGULATION/BICAP)     Patient location during evaluation: Endoscopy Anesthesia Type: MAC Level of consciousness: awake Pain management: pain level controlled Vital Signs Assessment: post-procedure vital signs reviewed and stable Respiratory status: spontaneous breathing Cardiovascular status: stable Postop Assessment: no apparent nausea or vomiting Anesthetic complications: no   No notable events documented.  Last Vitals:  Vitals:   06/01/22 1210 06/01/22 1220  BP: (!) 108/50 (!) 125/59  Pulse:  77  Resp: 13 16  Temp: (!) 36.3 C   SpO2: 96% 94%    Last Pain:  Vitals:   06/01/22 1220  TempSrc:   PainSc: 0-No pain                 Huston Foley

## 2022-06-01 NOTE — Progress Notes (Signed)
Givens Video Capsule Endoscopy ordered by MD Mansouraty.  MD Mansouraty placed video capsule @ 1205 .  Per Givens capsule instructions, pt must remain NPO 1405 until . At that time, pt may progress to clear liquids. At 1605 , pt may have a small snack such as a few cookies, half a sandwich, or a bowl of soup. At 2005 patient may progress to normal diet.  Capsule study will conclude at 2005 , at which time the belt and monitor may be removed.  Capsule to be returned tomorrow AM (06/02/22).  Pt educated on Given's Capsule instructions pre-operatively. Pt demonstrated understanding.  Debarah Crape, RN 06/01/22 12:44 PM

## 2022-06-01 NOTE — Progress Notes (Signed)
Patient had givens capsule placed during EGD 06/01/2022 at 1205. Verbal and written instructions given to patient. Patient will return equipment to main entrance of [facility] 06/02/2022 AM.

## 2022-06-01 NOTE — H&P (Signed)
GASTROENTEROLOGY PROCEDURE H&P NOTE   Primary Care Physician: [name], MD  HPI: [name] is a 67 y.o. female who presents for EGD with VCE placement for history of IDA and previously VCE with retainment in the stomach.  Past Medical History:  Diagnosis Date   ADHD (attention deficit hyperactivity disorder) 09/07/2011   Anesthesia complication 34/19/6222   Will need GETA for future endoscopy procedures. Did not tolerate MAC well.   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   Diverticulosis    DVT (deep venous thrombosis) ([place]) 09/07/2011   Hypertension    on no medications   Hypothyroid 09/07/2011   Internal hemorrhoids    Iron deficiency anemia    Personal history of radiation therapy 2012   Pneumonia    Smoker 02/17/2013   Tubular adenoma of colon    Past Surgical History:  Procedure Laterality Date   ABDOMINAL HYSTERECTOMY     BIOPSY  12/04/2019   Procedure: BIOPSY;  Surgeon: Juanita Craver, MD;  Location: [facility];  Service: Endoscopy;;   BIOPSY  06/14/2021   Procedure: BIOPSY;  Surgeon: Irving Copas., MD;  Location: [facility];  Service: Gastroenterology;;   BREAST BIOPSY Left 2013   BREAST LUMPECTOMY  12/2011   Left breast   COLONOSCOPY     COLONOSCOPY WITH PROPOFOL N/A 12/04/2019   Procedure: COLONOSCOPY WITH PROPOFOL;  Surgeon: Juanita Craver, MD;  Location: [facility];  Service: Endoscopy;  Laterality: N/A;   COLONOSCOPY WITH PROPOFOL N/A 09/20/2020   Procedure: COLONOSCOPY WITH PROPOFOL;  Surgeon: Rush Landmark Telford Nab., MD;  Location: [place];  Service: Gastroenterology;  Laterality: N/A;   COLONOSCOPY WITH PROPOFOL N/A 06/14/2021   Procedure: COLONOSCOPY WITH PROPOFOL;  Surgeon: Rush Landmark Telford Nab., MD;  Location: [facility];  Service: Gastroenterology;  Laterality:  N/A;   ENDOSCOPIC MUCOSAL RESECTION N/A 09/20/2020   Procedure: ENDOSCOPIC MUCOSAL RESECTION;  Surgeon: Rush Landmark Telford Nab., MD;  Location: [place];  Service: Gastroenterology;  Laterality: N/A;   ESOPHAGOGASTRODUODENOSCOPY (EGD) WITH PROPOFOL N/A 12/04/2019   Procedure: ESOPHAGOGASTRODUODENOSCOPY (EGD) WITH PROPOFOL;  Surgeon: Juanita Craver, MD;  Location: [facility];  Service: Endoscopy;  Laterality: N/A;   EYE SURGERY Right 06/2013   Lasix - Laser   HEMOSTASIS CLIP PLACEMENT  09/20/2020   Procedure: HEMOSTASIS CLIP PLACEMENT;  Surgeon: Irving Copas., MD;  Location: [place];  Service: Gastroenterology;;   POLYPECTOMY  12/04/2019   Procedure: POLYPECTOMY;  Surgeon: Juanita Craver, MD;  Location: [facility];  Service: Endoscopy;;   POLYPECTOMY  09/20/2020   Procedure: POLYPECTOMY;  Surgeon: Irving Copas., MD;  Location: [place];  Service: Gastroenterology;;   POLYPECTOMY  06/14/2021   Procedure: POLYPECTOMY;  Surgeon: Irving Copas., MD;  Location: [facility];  Service: Gastroenterology;;   [place] INJECTION  09/20/2020   Procedure: SUBMUCOSAL LIFTING INJECTION;  Surgeon: Irving Copas., MD;  Location: [facility];  Service: Gastroenterology;;   UPPER GASTROINTESTINAL ENDOSCOPY     No current facility-administered medications for this encounter.   No current facility-administered medications for this encounter. Allergies  Allergen Reactions   Penicillins Hives    Did it involve swelling of the face/tongue/throat, SOB, or low BP? No Did it involve sudden or severe rash/hives, skin peeling, or any reaction on the inside of your mouth or nose? No  Did you need to seek medical attention at a hospital or doctor's office? Yes When did it last happen?      35 years If all above answers are "NO", may proceed with cephalosporin use.   Family History  Problem Relation Age of Onset   Stomach cancer Father    Esophageal  cancer Father    Diabetes Maternal Grandmother    Crohn's disease Daughter    Ulcerative colitis Daughter    Colon cancer Neg Hx    Pancreatic cancer Neg Hx    Liver disease Neg Hx    Inflammatory bowel disease Neg Hx    Rectal cancer Neg Hx    Social History   Socioeconomic History   Marital status: Divorced    Spouse name: Not on file   Number of children: Not on file   Years of education: Not on file   Highest education level: Not on file  Occupational History   Not on file  Tobacco Use   Smoking status: Every Day    Packs/day: 0.50    Years: 40.00    Total pack years: 20.00    Types: E-cigarettes, Cigarettes   Smokeless tobacco: Never  Vaping Use   Vaping Use: Never used  Substance and Sexual Activity   Alcohol use: Yes    Alcohol/week: 0.0 standard drinks of alcohol    Comment: 4 mixed drinks per week    Drug use: No   Sexual activity: Never  Other Topics Concern   Not on file  Social History Narrative   Not on file   Social Determinants of Health   Financial Resource Strain: Not on file  Food Insecurity: Not on file  Transportation Needs: Not on file  Physical Activity: Not on file  Stress: Not on file  Social Connections: Not on file  Intimate Partner Violence: Not on file    Physical Exam: There were no vitals filed for this visit. There is no height or weight on file to calculate BMI. GEN: NAD EYE: Sclerae anicteric ENT: MMM CV: Non-tachycardic GI: Soft, NT/ND NEURO:  Alert & Oriented x 3  Lab Results: No results for input(s): "WBC", "HGB", "HCT", "PLT" in the last 72 hours. BMET No results for input(s): "NA", "K", "CL", "CO2", "GLUCOSE", "BUN", "CREATININE", "CALCIUM" in the last 72 hours. LFT No results for input(s): "PROT", "ALBUMIN", "AST", "ALT", "ALKPHOS", "BILITOT", "BILIDIR", "IBILI" in the last 72 hours. PT/INR No results for input(s): "LABPROT", "INR" in the last 72 hours.   Impression / Plan: This is a 66 y.o.female who  presents for EGD with VCE placement for history of IDA and previously VCE with retainment in the stomach.  The risks and benefits of endoscopic evaluation/treatment were discussed with the patient and/or family; these include but are not limited to the risk of perforation, infection, bleeding, missed lesions, lack of diagnosis, severe illness requiring hospitalization, as well as anesthesia and sedation related illnesses.  The patient's history has been reviewed, patient examined, no change in status, and deemed stable for procedure.  The patient and/or family is agreeable to proceed.    Justice Britain, MD [facility] Office # [phone]

## 2022-06-01 NOTE — Transfer of Care (Signed)
Immediate Anesthesia Transfer of Care Note  Patient: [name]  Procedure(s) Performed: ESOPHAGOGASTRODUODENOSCOPY (EGD) WITH PROPOFOL GIVENS CAPSULE STUDY HOT HEMOSTASIS (ARGON PLASMA COAGULATION/BICAP)  Patient Location: PACU  Anesthesia Type:MAC  Level of Consciousness: alert   Airway & Oxygen Therapy: Patient Spontanous Breathing and Patient connected to face mask oxygen  Post-op Assessment: Report given to RN and Post -op Vital signs reviewed and stable  Post vital signs: Reviewed and stable  Last Vitals:  Vitals Value Taken Time  BP    Temp    Pulse    Resp    SpO2      Last Pain:  Vitals:   06/01/22 1050  TempSrc: Temporal  PainSc: 0-No pain         Complications: No notable events documented.

## 2022-06-01 NOTE — Op Note (Signed)
[facility] Patient Name: [name] Procedure Date: 06/01/2022 MRN: [id number] Attending MD: Justice Britain , MD Date of Birth: 04/18/1956 CSN: [id number] Age: 66 Admit Type: Outpatient Procedure:                Upper GI endoscopy Indications:              Iron deficiency anemia Providers:                Justice Britain, MD, Dulcy Fanny, [facility] Technician, Technician Referring MD:             Cammie Mcgee. [place] Medicines:                Monitored Anesthesia Care Complications:            No immediate complications. Estimated Blood Loss:     Estimated blood loss was minimal. Procedure:                Pre-Anesthesia Assessment:                           - Prior to the procedure, a History and Physical                            was performed, and patient medications and                            allergies were reviewed. The patient's tolerance of                            previous anesthesia was also reviewed. The risks                            and benefits of the procedure and the sedation                            options and risks were discussed with the patient.                            All questions were answered, and informed consent                            was obtained. Prior Anticoagulants: The patient has                            taken no previous anticoagulant or antiplatelet                            agents except for aspirin. ASA Grade Assessment:                            III - A patient with severe systemic disease. After  reviewing the risks and benefits, the patient was                            deemed in satisfactory condition to undergo the                            procedure.                           After obtaining informed consent, the endoscope was                            passed under direct vision. Throughout the                            procedure, the  patient's blood pressure, pulse, and                            oxygen saturations were monitored continuously. The                            [id number]-H190 ([id number]) Olympus endoscope was introduced                            through the mouth, and advanced to the second part                            of duodenum. The upper GI endoscopy was                            accomplished without difficulty. The patient                            tolerated the procedure. Scope In: Scope Out: Findings:      A 2 cm gastric ectopic mucosal area was found at 17 cm from the       incisors. No other gross lesions were noted in the entire esophagus.      The Z-line was irregular and was found 39 cm from the incisors.      A 3 cm hiatal hernia was present.      No gross lesions were noted in the entire examined stomach.      One small angioectasia with typical arborization was found in the       duodenal bulb. Coagulation for bleeding prevention using argon plasma       was successful.      No other gross lesions were noted in the duodenal bulb, in the first       portion of the duodenum and in the second portion of the duodenum.      Using the endoscope, the video capsule enteroscope was advanced into the       duodenal sweep and released. Impression:               - GEM noted at 17 cm but no other gross lesions in  esophagus. Z-line irregular, 39 cm from the                            incisors.                           - 3 cm hiatal hernia.                           - No gross lesions in the stomach.                           - One angioectasia in the duodenal bulb. Treated                            with argon plasma coagulation (APC).                           - No gross lesions in the duodenal bulb, in the                            first portion of the duodenum and in the second                            portion of the duodenum. Moderate Sedation:      Not Applicable -  Patient had care per Anesthesia. Recommendation:           - The patient will be observed post-procedure,                            until all discharge criteria are met.                           - Discharge patient to home.                           - Patient has a contact number available for                            emergencies. The signs and symptoms of potential                            delayed complications were discussed with the                            patient. Return to normal activities tomorrow.                            Written discharge instructions were provided to the                            patient.                           - Diet as per VCE protocol.                           -  Continue present medications.                           - Return VCE equipment as able so that VCE can be                            uploaded and reviewed.                           - The findings and recommendations were discussed                            with the patient.                           - The findings and recommendations were discussed                            with the patient's family. Procedure Code(s):        --- Professional ---                           [phone], Esophagogastroduodenoscopy, flexible,                            transoral; with biopsy, single or multiple Diagnosis Code(s):        --- Professional ---                           K22.8, Other specified diseases of esophagus                           K44.9, Diaphragmatic hernia without obstruction or                            gangrene                           K31.819, Angiodysplasia of stomach and duodenum                            without bleeding                           D50.9, Iron deficiency anemia, unspecified CPT copyright 2019 [employer] Association. All rights reserved. The codes documented in this report are preliminary and upon coder review may  be revised to meet current compliance  requirements. Justice Britain, MD 06/01/2022 12:28:30 PM Number of Addenda: 0

## 2022-06-01 NOTE — Discharge Instructions (Signed)
YOU HAD AN ENDOSCOPIC PROCEDURE TODAY: Refer to the procedure report and other information in the discharge instructions given to you for any specific questions about what was found during the examination. If this information does not answer your questions, please call [place] office at [phone] to clarify.   YOU SHOULD EXPECT: Some feelings of bloating in the abdomen. Passage of more gas than usual. Walking can help get rid of the air that was put into your GI tract during the procedure and reduce the bloating. If you had a lower endoscopy (such as a colonoscopy or flexible sigmoidoscopy) you may notice spotting of blood in your stool or on the toilet paper. Some abdominal soreness may be present for a day or two, also.  DIET: Your first meal following the procedure should be a light meal and then it is ok to progress to your normal diet. A half-sandwich or bowl of soup is an example of a good first meal. Heavy or fried foods are harder to digest and may make you feel nauseous or bloated. Drink plenty of fluids but you should avoid alcoholic beverages for 24 hours. If you had a esophageal dilation, please see attached instructions for diet.    ACTIVITY: Your care partner should take you home directly after the procedure. You should plan to take it easy, moving slowly for the rest of the day. You can resume normal activity the day after the procedure however YOU SHOULD NOT DRIVE, use power tools, machinery or perform tasks that involve climbing or major physical exertion for 24 hours (because of the sedation medicines used during the test).   SYMPTOMS TO REPORT IMMEDIATELY: A gastroenterologist can be reached at any hour. Please call [phone]  for any of the following symptoms:   Following upper endoscopy (EGD, EUS, ERCP, esophageal dilation) Vomiting of blood or coffee ground material  New, significant abdominal pain  New, significant chest pain or pain under the shoulder blades  Painful or  persistently difficult swallowing  New shortness of breath  Black, tarry-looking or red, bloody stools  FOLLOW UP:  If any biopsies were taken you will be contacted by phone or by letter within the next 1-3 weeks. Call [phone]  if you have not heard about the biopsies in 3 weeks.  Please also call with any specific questions about appointments or follow up tests.  

## 2022-06-04 ENCOUNTER — Encounter (HOSPITAL_COMMUNITY): Payer: Self-pay | Admitting: Gastroenterology

## 2022-06-12 ENCOUNTER — Ambulatory Visit (HOSPITAL_COMMUNITY): Payer: Medicare Other

## 2022-06-15 ENCOUNTER — Ambulatory Visit (HOSPITAL_COMMUNITY): Payer: Medicare Other

## 2022-06-15 DIAGNOSIS — K5521 Angiodysplasia of colon with hemorrhage: Secondary | ICD-10-CM

## 2022-06-16 ENCOUNTER — Telehealth: Payer: Self-pay | Admitting: Gastroenterology

## 2022-06-16 NOTE — Telephone Encounter (Signed)
Video capsule endoscopy report will be scanned into the chart  Endoscopically placed capsule study. Finding of multiple AVMs, nonbleeding. The most proximal AVM is 32 minutes distal to the first duodenal image. The most distal is 2 hours and 50 minutes beyond first duodenal image. Continue iron supplementation. Repeat hemoglobin. Consider enteroscopy with APC of more proximal AVMs for melena, anemia. Office follow-up recommended   I called and spoke with the patient about these results. She unfortunately is suffering from [place] but is slowly recovering. Our plan of action is as follows: 1) begin checking CBCs/iron/TIBC/ferritin every 3 to 4 months 2) this can be performed at our office or at Dr. Samella Parr office 3) continue oral iron as has been supplemented 4) can consider intravenous iron infusions should iron levels begin to drop 5) if anemia develops and cannot be maintained with oral +/- IV iron supplementation/infusion then will need to consider push enteroscopy 6) if SBE pursued but not able to reach all of the AVMs, then DBE at [facility] will need to be considered  Patient agrees with this plan of action. We will try to make a copy of the report for the patient so that he can be mailed.   Justice Britain, MD [facility] Office # [phone]

## 2022-06-16 NOTE — Telephone Encounter (Signed)
Thank you Dr Dennard Schaumann

## 2022-06-17 NOTE — Telephone Encounter (Signed)
WTD, Thanks again. I would just have orders for a CBC/iron/TIBC/ferritin drawn in October (once she is over [place]). You can forward me the results and then we can determine any follow-up action that would be needed if necessary. Have a great day. GM

## 2022-06-19 ENCOUNTER — Other Ambulatory Visit: Payer: Self-pay | Admitting: Family Medicine

## 2022-06-19 ENCOUNTER — Encounter: Payer: Self-pay | Admitting: Family Medicine

## 2022-06-19 DIAGNOSIS — D500 Iron deficiency anemia secondary to blood loss (chronic): Secondary | ICD-10-CM

## 2022-06-23 ENCOUNTER — Encounter: Payer: Self-pay | Admitting: Gastroenterology

## 2022-06-26 ENCOUNTER — Encounter: Payer: Self-pay | Admitting: Family Medicine

## 2022-06-26 ENCOUNTER — Other Ambulatory Visit: Payer: Self-pay | Admitting: Family Medicine

## 2022-06-26 ENCOUNTER — Ambulatory Visit (HOSPITAL_COMMUNITY)
Admission: RE | Admit: 2022-06-26 | Discharge: 2022-06-26 | Disposition: A | Discharge status: Home / Self Care | Payer: Medicare Other | Source: Ambulatory Visit | Attending: Adult Health | Admitting: Adult Health

## 2022-06-26 DIAGNOSIS — I700 Atherosclerosis of aorta: Secondary | ICD-10-CM | POA: Insufficient documentation

## 2022-06-26 DIAGNOSIS — J439 Emphysema, unspecified: Secondary | ICD-10-CM | POA: Diagnosis not present

## 2022-06-26 DIAGNOSIS — Z87891 Personal history of nicotine dependence: Secondary | ICD-10-CM | POA: Insufficient documentation

## 2022-06-26 DIAGNOSIS — D3502 Benign neoplasm of left adrenal gland: Secondary | ICD-10-CM | POA: Insufficient documentation

## 2022-06-26 DIAGNOSIS — I2510 Atherosclerotic heart disease of native coronary artery without angina pectoris: Secondary | ICD-10-CM | POA: Insufficient documentation

## 2022-06-26 DIAGNOSIS — Z122 Encounter for screening for malignant neoplasm of respiratory organs: Secondary | ICD-10-CM | POA: Diagnosis not present

## 2022-06-26 MED ORDER — AMPHETAMINE-DEXTROAMPHETAMINE 10 MG PO TABS
10.0000 mg | ORAL_TABLET | Freq: Two times a day (BID) | ORAL | 0 refills | Status: DC
Start: 2022-06-26 — End: 2022-06-26

## 2022-06-26 MED ORDER — AMPHETAMINE-DEXTROAMPHETAMINE 10 MG PO TABS: 10.0000 mg | ORAL_TABLET | Freq: Two times a day (BID) | ORAL | 0 refills | Status: DC

## 2022-06-28 ENCOUNTER — Telehealth: Payer: Self-pay | Admitting: *Deleted

## 2022-06-28 NOTE — Telephone Encounter (Signed)
-----   Message from Gardenia Phlegm, NP sent at 06/28/2022  4:04 PM EDT ----- Regarding: FW: Scans are negative.  Please let patient know that I recommend repeat in 1 year ----- Message ----- From: Interface, Rad Results In Sent: 06/28/2022   9:39 AM EDT To: Gardenia Phlegm, NP

## 2022-06-28 NOTE — Telephone Encounter (Signed)
Per Lindsey Causey, DNP, called pt with message below. Pt verbalized understanding 

## 2022-06-30 ENCOUNTER — Telehealth: Payer: Self-pay | Admitting: Adult Health

## 2022-06-30 NOTE — Telephone Encounter (Signed)
Scheduled appointment per 9/29 staff message. Left voicemail.

## 2022-07-04 ENCOUNTER — Inpatient Hospital Stay: Payer: Medicare Other | Attending: Adult Health | Admitting: Adult Health

## 2022-07-04 DIAGNOSIS — C50412 Malignant neoplasm of upper-outer quadrant of left female breast: Secondary | ICD-10-CM | POA: Diagnosis not present

## 2022-07-04 DIAGNOSIS — Z170 Estrogen receptor positive status [ER+]: Secondary | ICD-10-CM | POA: Diagnosis not present

## 2022-07-04 NOTE — Progress Notes (Signed)
[facility] Follow up:    Jean Frizzle, MD [address] [place] [address]   DIAGNOSIS: h/o left breast cancer, high risk for lung cancer  I connected with [name] on 07/05/22 at  8:15 AM EDT by telephone and verified that I am speaking with the correct person using two identifiers.  I discussed the limitations, risks, security and privacy concerns of performing an evaluation and management service by telephone and the availability of in person appointments.  I also discussed with the patient that there may be a patient responsible charge related to this service. The patient expressed understanding and agreed to proceed.  Patient location: home Provider location: [facility] office   SUMMARY OF ONCOLOGIC HISTORY: Oncology History  Breast cancer of upper-outer quadrant of left female breast ([place])  01/30/2007 Surgery   Left breast lumpectomy: High-grade DCIS with focal necrosis 1.7 cm ER 54%, PR 74%   03/26/2007 - 05/13/2007 Radiation Therapy   Adjuvant radiation therapy   06/05/2007 - 06/05/2012 Anti-estrogen oral therapy   Arimidex 1 mg daily     CURRENT THERAPY:observation  INTERVAL HISTORY: [name] 66 y.o. female returns for follow-up to discuss her recent CT chest results.  She underwent CT chest nodule f/u wo contrast on 06/26/2022.  She had questions about the nodule and the appearance of bronchiolitis and whether that was related to her having COVID 2 weeks prior to the CT scan.  She is feeling well today and tells me she recovered from her COVID.    Patient Active Problem List   Diagnosis Date Noted   AVM (arteriovenous malformation) of small bowel, acquired with hemorrhage    Positive FIT (fecal immunochemical test) 11/18/2021   History of colonic polyps 11/18/2021   Atrophic gastritis 11/03/2021   Chronic idiopathic constipation 11/03/2021   Diverticular disease of colon 11/03/2021   Iron deficiency anemia 11/03/2021   Personal  history of colonic polyps 11/03/2021   Carpal tunnel syndrome of right wrist 10/05/2017   Coronary artery calcification seen on CAT scan    Smoker 02/17/2013   Occlusion and stenosis of carotid artery without mention of cerebral infarction 09/23/2012   Breast cancer of upper-outer quadrant of left female breast ([place]) 09/07/2011   ADHD (attention deficit hyperactivity disorder) 09/07/2011   DVT (deep venous thrombosis) ([place]) 09/07/2011   Hypothyroid 09/07/2011    is allergic to penicillins.  MEDICAL HISTORY: Past Medical History:  Diagnosis Date   ADHD (attention deficit hyperactivity disorder) 09/07/2011   Anesthesia complication 54/65/6812   Will need GETA for future endoscopy procedures. Did not tolerate MAC well.   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   Diverticulosis    DVT (deep venous thrombosis) ([place]) 09/07/2011   Hypertension    on no medications   Hypothyroid 09/07/2011   Internal hemorrhoids    Iron deficiency anemia    Personal history of radiation therapy 2012   Pneumonia    Smoker 02/17/2013   Tubular adenoma of colon     SURGICAL HISTORY: Past Surgical History:  Procedure Laterality Date   ABDOMINAL HYSTERECTOMY     BIOPSY  12/04/2019   Procedure: BIOPSY;  Surgeon: Juanita Craver, MD;  Location: [facility];  Service: Endoscopy;;   BIOPSY  06/14/2021   Procedure: BIOPSY;  Surgeon: Irving Copas., MD;  Location: [facility];  Service: Gastroenterology;;   BREAST BIOPSY Left 2013   BREAST LUMPECTOMY  12/2011   Left breast   COLONOSCOPY     COLONOSCOPY WITH PROPOFOL N/A 12/04/2019   Procedure: COLONOSCOPY WITH PROPOFOL;  Surgeon: Juanita Craver, MD;  Location: [facility];  Service: Endoscopy;  Laterality: N/A;   COLONOSCOPY WITH PROPOFOL N/A 09/20/2020   Procedure: COLONOSCOPY WITH  PROPOFOL;  Surgeon: Rush Landmark Telford Nab., MD;  Location: [place];  Service: Gastroenterology;  Laterality: N/A;   COLONOSCOPY WITH PROPOFOL N/A 06/14/2021   Procedure: COLONOSCOPY WITH PROPOFOL;  Surgeon: Rush Landmark Telford Nab., MD;  [facility];  Service: Gastroenterology;  Laterality: N/A;   ENDOSCOPIC MUCOSAL RESECTION N/A 09/20/2020   Procedure: ENDOSCOPIC MUCOSAL RESECTION;  Surgeon: Rush Landmark Telford Nab., MD;  Location: [place];  Service: Gastroenterology;  Laterality: N/A;   ESOPHAGOGASTRODUODENOSCOPY (EGD) WITH PROPOFOL N/A 12/04/2019   Procedure: ESOPHAGOGASTRODUODENOSCOPY (EGD) WITH PROPOFOL;  Surgeon: Juanita Craver, MD;  [facility];  Service: Endoscopy;  Laterality: N/A;   ESOPHAGOGASTRODUODENOSCOPY (EGD) WITH PROPOFOL N/A 06/01/2022   Procedure: ESOPHAGOGASTRODUODENOSCOPY (EGD) WITH PROPOFOL;  Surgeon: Rush Landmark Telford Nab., MD;  [facility];  Service: Gastroenterology;  Laterality: N/A;   EYE SURGERY Right 06/2013   Lasix - Laser   GIVENS CAPSULE STUDY N/A 06/01/2022   Procedure: GIVENS CAPSULE STUDY;  Surgeon: Irving Copas., MD;  [facility];  Service: Gastroenterology;  Laterality: N/A;   HEMOSTASIS CLIP PLACEMENT  09/20/2020   Procedure: HEMOSTASIS CLIP PLACEMENT;  Surgeon: Irving Copas., MD;  Location: [place];  Service: Gastroenterology;;   HOT HEMOSTASIS N/A 06/01/2022   Procedure: HOT HEMOSTASIS (ARGON PLASMA COAGULATION/BICAP);  Surgeon: Irving Copas., MD;  Location: [facility];  Service: Gastroenterology;  Laterality: N/A;   POLYPECTOMY  12/04/2019   Procedure: POLYPECTOMY;  Surgeon: Juanita Craver, MD;  [facility];  Service: Endoscopy;;   POLYPECTOMY  09/20/2020   Procedure: POLYPECTOMY;  Surgeon: Irving Copas., MD;  Location: [place];  Service: Gastroenterology;;   POLYPECTOMY  06/14/2021   Procedure: POLYPECTOMY;  Surgeon: Irving Copas., MD;   Location: [facility];  Service: Gastroenterology;;   SUBMUCOSAL LIFTING INJECTION  09/20/2020   Procedure: SUBMUCOSAL LIFTING INJECTION;  Surgeon: Irving Copas., MD;  Location[facility];  Service: Gastroenterology;;   UPPER GASTROINTESTINAL ENDOSCOPY      SOCIAL HISTORY: Social History   Socioeconomic History   Marital status: Divorced    Spouse name: Not on file   Number of children: Not on file   Years of education: Not on file   Highest education level: Not on file  Occupational History   Not on file  Tobacco Use   Smoking status: Every Day    Packs/day: 0.50    Years: 40.00    Total pack years: 20.00    Types: E-cigarettes, Cigarettes   Smokeless tobacco: Never  Vaping Use   Vaping Use: Never used  Substance and Sexual Activity   Alcohol use: Yes    Alcohol/week: 0.0 standard drinks of alcohol    Comment: 4 mixed drinks per week    Drug use: No   Sexual activity: Never  Other Topics Concern   Not on file  Social History Narrative   Not on file   Social Determinants of Health   Financial Resource Strain: Not on file  Food Insecurity: Not on file  Transportation Needs: Not on file  Physical Activity: Not on file  Stress: Not on  file  Social Connections: Not on file  Intimate Partner Violence: Not on file    FAMILY HISTORY: Family History  Problem Relation Age of Onset   Stomach cancer Father    Esophageal cancer Father    Diabetes Maternal Grandmother    Crohn's disease Daughter    Ulcerative colitis Daughter    Colon cancer Neg Hx    Pancreatic cancer Neg Hx    Liver disease Neg Hx    Inflammatory bowel disease Neg Hx    Rectal cancer Neg Hx     Review of Systems  Constitutional:  Negative for appetite change, chills, fatigue, fever and unexpected weight change.  HENT:   Negative for hearing loss, lump/mass and trouble swallowing.   Eyes:  Negative for eye problems and icterus.  Respiratory:  Negative for chest tightness, cough  and shortness of breath.   Cardiovascular:  Negative for chest pain, leg swelling and palpitations.  Gastrointestinal:  Negative for abdominal distention, abdominal pain, constipation, diarrhea, nausea and vomiting.  Endocrine: Negative for hot flashes.  Genitourinary:  Negative for difficulty urinating.   Musculoskeletal:  Negative for arthralgias.  Skin:  Negative for itching and rash.  Neurological:  Negative for dizziness, extremity weakness, headaches and numbness.  Hematological:  Negative for adenopathy. Does not bruise/bleed easily.  Psychiatric/Behavioral:  Negative for depression. The patient is not nervous/anxious.       PHYSICAL EXAMINATION Patient sounds well.  She is in no apparent distress, mood and behavior are normal.   LABORATORY DATA:  None for this visit  ASSESSMENT and THERAPY PLAN:   Breast cancer of upper-outer quadrant of left female breast [name] and I talked today about her CT lung cancer screening in detail.  I reviewed the results with her which indicate that she likely has some residual inflammation from COVID.  We looked back at her previous scans which did not notice any bronchiolitis.  We reviewed the adrenal adenoma and also discussed that she should continue having annual lung cancer screening due to her heavy tobacco history.  [name] has no other concerns and we will see her back in February 2024 for continued long-term follow-up of her breast cancer.  Follow up instructions:    -Return to cancer center 11/2022    The patient was provided an opportunity to ask questions and all were answered. The patient agreed with the plan and demonstrated an understanding of the instructions.   The patient was advised to call back or seek an in-person evaluation if the symptoms worsen or if the condition fails to improve as anticipated.   I provided 11 minutes of non face-to-face telephone visit time during this encounter, and > 50% was spent counseling as  documented under my assessment & plan.  Wilber Bihari, NP 07/05/22 9:57 AM Medical Oncology and Hematology [facility] [place], [place] [address] Tel. [phone]    Fax. [phone]

## 2022-07-05 ENCOUNTER — Encounter: Payer: Self-pay | Admitting: Adult Health

## 2022-07-05 NOTE — Assessment & Plan Note (Addendum)
[name] and I talked today about her CT lung cancer screening in detail.  I reviewed the results with her which indicate that she likely has some residual inflammation from COVID.  We looked back at her previous scans which did not notice any bronchiolitis.  We reviewed the adrenal adenoma and also discussed that she should continue having annual lung cancer screening due to her heavy tobacco history.  [name] has no other concerns and we will see her back in February 2024 for continued long-term follow-up of her breast cancer.

## 2022-07-28 ENCOUNTER — Encounter: Payer: Self-pay | Admitting: Family Medicine

## 2022-07-28 ENCOUNTER — Telehealth: Payer: Self-pay

## 2022-07-28 ENCOUNTER — Other Ambulatory Visit: Payer: Self-pay | Admitting: Family Medicine

## 2022-07-28 MED ORDER — AMPHETAMINE-DEXTROAMPHETAMINE 10 MG PO TABS: 10.0000 mg | ORAL_TABLET | Freq: Two times a day (BID) | ORAL | 0 refills | Status: DC

## 2022-07-28 NOTE — Telephone Encounter (Signed)
Pt needs refill on Adderall. Last RF 06/26/2022. Last OV 11/10/2021. Thank you.

## 2022-08-01 ENCOUNTER — Encounter: Payer: Self-pay | Admitting: Family Medicine

## 2022-08-07 ENCOUNTER — Other Ambulatory Visit: Payer: Medicare Other

## 2022-08-07 DIAGNOSIS — R195 Other fecal abnormalities: Secondary | ICD-10-CM

## 2022-08-07 DIAGNOSIS — D500 Iron deficiency anemia secondary to blood loss (chronic): Secondary | ICD-10-CM

## 2022-08-08 LAB — B12 AND FOLATE PANEL
Folate: >24 ng/mL
Vitamin B-12: 672 pg/mL (ref 200–1100)

## 2022-08-08 LAB — CBC WITH DIFFERENTIAL/PLATELET
Absolute Monocytes: 698 cells/uL (ref 200–950)
Basophils Absolute: 58 cells/uL (ref 0–200)
Basophils Relative: 0.6 %
Eosinophils Absolute: 446 cells/uL (ref 15–500)
Eosinophils Relative: 4.6 %
HCT: 37.1 % (ref 35.0–45.0)
Hemoglobin: 12.7 g/dL (ref 11.7–15.5)
Lymphs Abs: 1610 cells/uL (ref 850–3900)
MCH: 32.9 pg (ref 27.0–33.0)
MCHC: 34.2 g/dL (ref 32.0–36.0)
MCV: 96.1 fL (ref 80.0–100.0)
MPV: 10 fL (ref 7.5–12.5)
Monocytes Relative: 7.2 %
Neutro Abs: 6887 cells/uL (ref 1500–7800)
Neutrophils Relative %: 71 %
Platelets: 548 10*3/uL — ABNORMAL HIGH (ref 140–400)
RBC: 3.86 10*6/uL (ref 3.80–5.10)
RDW: 12.7 % (ref 11.0–15.0)
Total Lymphocyte: 16.6 %
WBC: 9.7 10*3/uL (ref 3.8–10.8)

## 2022-08-08 LAB — IRON,TIBC AND FERRITIN PANEL
%SAT: 47 % (calc) — ABNORMAL HIGH (ref 16–45)
Ferritin: 12 ng/mL — ABNORMAL LOW (ref 16–288)
Iron: 203 ug/dL — ABNORMAL HIGH (ref 45–160)
TIBC: 429 mcg/dL (calc) (ref 250–450)

## 2022-08-08 LAB — RETICULOCYTES
ABS Retic: 98800 cells/uL — ABNORMAL HIGH (ref 20000–80000)
Retic Ct Pct: 2.6 %

## 2022-08-10 ENCOUNTER — Encounter: Payer: Self-pay | Admitting: Gastroenterology

## 2022-08-27 ENCOUNTER — Encounter: Payer: Self-pay | Admitting: Family Medicine

## 2022-08-28 ENCOUNTER — Other Ambulatory Visit: Payer: Self-pay | Admitting: Family Medicine

## 2022-08-28 ENCOUNTER — Telehealth: Payer: Self-pay

## 2022-08-28 MED ORDER — AMPHETAMINE-DEXTROAMPHETAMINE 10 MG PO TABS: 10.0000 mg | ORAL_TABLET | Freq: Two times a day (BID) | ORAL | 0 refills | Status: DC

## 2022-08-28 MED ORDER — AMPHETAMINE-DEXTROAMPHETAMINE 10 MG PO TABS
10.0000 mg | ORAL_TABLET | Freq: Two times a day (BID) | ORAL | 0 refills | Status: DC
Start: 2022-08-28 — End: 2022-12-01

## 2022-08-28 NOTE — Telephone Encounter (Signed)
Dr Rush Landmark see recent labs for review.

## 2022-08-28 NOTE — Telephone Encounter (Signed)
[name], You may let the patient know that I have reviewed her blood work.  I do agree that the majority of her iron indices are okay but her ferritin is still slightly low.  With her iron saturation being where it is, and with her having a normal hemoglobin, I do not need to make any further adjustments in regards to her iron. Plan will be to recheck these in 3 to 4 months.  That can be done under my name but can be done at Dr. Samella Parr office. Thanks. GM

## 2022-08-28 NOTE — Telephone Encounter (Signed)
Pt called requesting rf on her Adderall. Last RF was 07/28/2022. Last OV was 11/21/2021. Pt uses [facility]. Thank you.

## 2022-08-28 NOTE — Telephone Encounter (Signed)
PT WOULD LIKE RX SENT TO [facility]. THANK YOU! I entered [facility] in Error. MJ

## 2022-09-07 ENCOUNTER — Ambulatory Visit (INDEPENDENT_AMBULATORY_CARE_PROVIDER_SITE_OTHER): Payer: Medicare Other | Admitting: Family Medicine

## 2022-09-07 VITALS — BP 116/64 | HR 82 | Ht 65.0 in | Wt 122.6 lb

## 2022-09-07 DIAGNOSIS — R062 Wheezing: Secondary | ICD-10-CM

## 2022-09-07 DIAGNOSIS — I2510 Atherosclerotic heart disease of native coronary artery without angina pectoris: Secondary | ICD-10-CM

## 2022-09-07 MED ORDER — ALBUTEROL SULFATE HFA 108 (90 BASE) MCG/ACT IN AERS: 2.0000 | INHALATION_SPRAY | Freq: Four times a day (QID) | RESPIRATORY_TRACT | 0 refills | Status: DC | PRN

## 2022-09-07 NOTE — Progress Notes (Signed)
Subjective:    Patient ID: [name], female    DOB: 28-Jun-1956, 66 y.o.   MRN: [id number]  Cough  Patient has a longstanding history of smoking.  About 7 days ago she was exposed to an upper respiratory infection.  She has had a dry nonproductive cough since that time.  She denies any fevers or chills.  She denies any chest pain.  She does have some wheezing that she can hear an occasional tightness in the chest associated with coughing fits.  She denies any sinus pain, rhinorrhea, sore throat, or otalgia  Past Medical History:  Diagnosis Date   ADHD (attention deficit hyperactivity disorder) 09/07/2011   Anesthesia complication 48/18/5631   Will need GETA for future endoscopy procedures. Did not tolerate MAC well.   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   Diverticulosis    DVT (deep venous thrombosis) ([place]) 09/07/2011   Hypertension    on no medications   Hypothyroid 09/07/2011   Internal hemorrhoids    Iron deficiency anemia    Personal history of radiation therapy 2012   Pneumonia    Smoker 02/17/2013   Tubular adenoma of colon    Past Surgical History:  Procedure Laterality Date   ABDOMINAL HYSTERECTOMY     BIOPSY  12/04/2019   Procedure: BIOPSY;  Surgeon: Juanita Craver, MD;  [facility];  Service: Endoscopy;;   BIOPSY  06/14/2021   Procedure: BIOPSY;  Surgeon: Irving Copas., MD;  [facility];  Service: Gastroenterology;;   BREAST BIOPSY Left 2013   BREAST LUMPECTOMY  12/2011   Left breast   COLONOSCOPY     COLONOSCOPY WITH PROPOFOL N/A 12/04/2019   Procedure: COLONOSCOPY WITH PROPOFOL;  Surgeon: Juanita Craver, MD;  [facility];  Service: Endoscopy;  Laterality: N/A;   COLONOSCOPY WITH PROPOFOL N/A 09/20/2020   Procedure: COLONOSCOPY WITH PROPOFOL;  Surgeon:  Rush Landmark Telford Nab., MD;  Location: [place];  Service: Gastroenterology;  Laterality: N/A;   COLONOSCOPY WITH PROPOFOL N/A 06/14/2021   Procedure: COLONOSCOPY WITH PROPOFOL;  Surgeon: Rush Landmark Telford Nab., MD;  [facility];  Service: Gastroenterology;  Laterality: N/A;   ENDOSCOPIC MUCOSAL RESECTION N/A 09/20/2020   Procedure: ENDOSCOPIC MUCOSAL RESECTION;  Surgeon: Rush Landmark Telford Nab., MD;  Location: [place];  Service: Gastroenterology;  Laterality: N/A;   ESOPHAGOGASTRODUODENOSCOPY (EGD) WITH PROPOFOL N/A 12/04/2019   Procedure: ESOPHAGOGASTRODUODENOSCOPY (EGD) WITH PROPOFOL;  Surgeon: Juanita Craver, MD;  [facility];  Service: Endoscopy;  Laterality: N/A;   ESOPHAGOGASTRODUODENOSCOPY (EGD) WITH PROPOFOL N/A 06/01/2022   Procedure: ESOPHAGOGASTRODUODENOSCOPY (EGD) WITH PROPOFOL;  Surgeon: Rush Landmark Telford Nab., MD;  [facility];  Service: Gastroenterology;  Laterality: N/A;   EYE SURGERY Right 06/2013   Lasix - Laser   GIVENS CAPSULE STUDY N/A 06/01/2022   Procedure: GIVENS CAPSULE STUDY;  Surgeon: Irving Copas., MD;  [facility];  Service: Gastroenterology;  Laterality: N/A;   HEMOSTASIS CLIP PLACEMENT  09/20/2020   Procedure: HEMOSTASIS CLIP PLACEMENT;  Surgeon: Irving Copas., MD;  Location: [place];  Service: Gastroenterology;;   HOT HEMOSTASIS N/A 06/01/2022   Procedure: HOT HEMOSTASIS (ARGON PLASMA COAGULATION/BICAP);  Surgeon: Irving Copas., MD;  Location: [facility];  Service: Gastroenterology;  Laterality: N/A;   POLYPECTOMY  12/04/2019   Procedure: POLYPECTOMY;  Surgeon: Juanita Craver,  MD;  [facility];  Service: Endoscopy;;   POLYPECTOMY  09/20/2020   Procedure: POLYPECTOMY;  Surgeon: Irving Copas., MD;  Location: [place];  Service: Gastroenterology;;   POLYPECTOMY  06/14/2021   Procedure: POLYPECTOMY;  Surgeon: Irving Copas., MD;  Location: [facility];  Service: Gastroenterology;;   SUBMUCOSAL LIFTING INJECTION  09/20/2020   Procedure: SUBMUCOSAL LIFTING INJECTION;  Surgeon: Irving Copas., MD;  Location: [place];  Service: Gastroenterology;;   UPPER GASTROINTESTINAL ENDOSCOPY     Current Outpatient Medications on File Prior to Visit  Medication Sig Dispense Refill   acetaminophen (TYLENOL) 650 MG CR tablet Take 650 mg by mouth every 8 (eight) hours as needed for pain.     amphetamine-dextroamphetamine (ADDERALL) 10 MG tablet Take 1 tablet (10 mg total) by mouth 2 (two) times daily. 180 tablet 0   B Complex-C (B-COMPLEX WITH VITAMIN C) tablet Take 1 tablet by mouth daily.     bisacodyl (DULCOLAX) 5 MG EC tablet Take 5 mg by mouth in the morning.     cholecalciferol (VITAMIN D) 1000 UNITS tablet Take 1,000 Units by mouth daily.     esomeprazole (NEXIUM) 40 MG capsule Take 1 capsule (40 mg total) by mouth daily. 90 capsule 1   levothyroxine (SYNTHROID) 75 MCG tablet TAKE 1 TABLET DAILY BEFORE BREAKFAST 90 tablet 3   Multiple Vitamin (MULTIVITAMIN WITH MINERALS) TABS tablet Take 1 tablet by mouth daily. Centrum Silver     naproxen (NAPROSYN) 375 MG tablet TAKE 1 TABLET TWICE A DAY WITH MEALS (Patient taking differently: Take 375 mg by mouth daily.) 180 tablet 3   polycarbophil (FIBERCON) 625 MG tablet Take 625 mg by mouth daily.     rosuvastatin (CRESTOR) 20 MG tablet TAKE 1 TABLET DAILY (NEED APPOINTMENT) 90 tablet 3   No current facility-administered medications on file prior to visit.   Allergies  Allergen Reactions   Penicillins Hives    Did it involve swelling of the face/tongue/throat, SOB, or low BP? No Did it involve sudden or severe rash/hives, skin peeling, or any reaction on the inside of your mouth or nose? No Did you need to seek medical attention at a hospital or doctor's office? Yes When did it last happen?      35 years If all above answers are "NO", may proceed with cephalosporin use.   Social  History   Socioeconomic History   Marital status: Divorced    Spouse name: Not on file   Number of children: Not on file   Years of education: Not on file   Highest education level: Not on file  Occupational History   Not on file  Tobacco Use   Smoking status: Every Day    Packs/day: 0.50    Years: 40.00    Total pack years: 20.00    Types: E-cigarettes, Cigarettes   Smokeless tobacco: Never  Vaping Use   Vaping Use: Never used  Substance and Sexual Activity   Alcohol use: Yes    Alcohol/week: 0.0 standard drinks of alcohol    Comment: 4 mixed drinks per week    Drug use: No   Sexual activity: Never  Other Topics Concern   Not on file  Social History Narrative   Not on file   Social Determinants of Health   Financial Resource Strain: Not on file  Food Insecurity: Not on file  Transportation Needs: Not on file  Physical Activity: Not on file  Stress: Not on file  Social Connections:  Not on file  Intimate Partner Violence: Not on file     Review of Systems  All other systems reviewed and are negative.      Objective:   Physical Exam Vitals reviewed.  Constitutional:      Appearance: Normal appearance.  Cardiovascular:     Rate and Rhythm: Normal rate.     Heart sounds: Normal heart sounds.  Pulmonary:     Effort: Pulmonary effort is normal. No respiratory distress.     Breath sounds: Wheezing present. No rhonchi or rales.  Neurological:     General: No focal deficit present.     Mental Status: She is alert and oriented to person, place, and time.           Assessment & Plan:   Wheezing Patient has an upper respiratory infection and bronchospasms likely due to underlying mild emphysema.  At the present time there is no purulent sputum or signs of a secondary infection so she does not require antibiotics or prednisone.  Instead I will treat the patient with albuterol 2 puffs every 4-6 hours as needed and tincture of time.  If the wheezing worsens I  will add prednisone and if she develops purulent sputum I will add an antibiotic

## 2022-09-24 ENCOUNTER — Other Ambulatory Visit: Payer: Self-pay | Admitting: Gastroenterology

## 2022-11-06 ENCOUNTER — Other Ambulatory Visit: Payer: Medicare Other

## 2022-11-06 DIAGNOSIS — D500 Iron deficiency anemia secondary to blood loss (chronic): Secondary | ICD-10-CM

## 2022-11-06 LAB — CBC WITH DIFFERENTIAL/PLATELET
Absolute Monocytes: 727 cells/uL (ref 200–950)
Basophils Absolute: 79 cells/uL (ref 0–200)
Basophils Relative: 1.1 %
Eosinophils Absolute: 533 cells/uL — ABNORMAL HIGH (ref 15–500)
Eosinophils Relative: 7.4 %
HCT: 39.5 % (ref 35.0–45.0)
Hemoglobin: 13.6 g/dL (ref 11.7–15.5)
Lymphs Abs: 1483 cells/uL (ref 850–3900)
MCH: 32.6 pg (ref 27.0–33.0)
MCHC: 34.4 g/dL (ref 32.0–36.0)
MCV: 94.7 fL (ref 80.0–100.0)
MPV: 10.2 fL (ref 7.5–12.5)
Monocytes Relative: 10.1 %
Neutro Abs: 4378 cells/uL (ref 1500–7800)
Neutrophils Relative %: 60.8 %
Platelets: 485 10*3/uL — ABNORMAL HIGH (ref 140–400)
RBC: 4.17 10*6/uL (ref 3.80–5.10)
RDW: 12 % (ref 11.0–15.0)
Total Lymphocyte: 20.6 %
WBC: 7.2 10*3/uL (ref 3.8–10.8)

## 2022-11-13 NOTE — Progress Notes (Unsigned)
Subjective:   GLADIS HENION is a 67 y.o. female who presents for Medicare Annual (Subsequent) preventive examination.  Review of Systems    ***       Objective:    There were no vitals filed for this visit. There is no height or weight on file to calculate BMI.     06/01/2022   10:32 AM 06/14/2021   10:11 AM 11/05/2020    9:02 AM 09/20/2020    9:13 AM 12/04/2019    6:47 AM 08/06/2015    5:43 PM 06/14/2015    9:19 AM  Advanced Directives  Does Patient Have a Medical Advance Directive? Yes No No No No No No  Type of Advance Directive Living will        Would patient like information on creating a medical advance directive? No - Patient declined No - Patient declined No - Patient declined No - Patient declined Yes (Inpatient - patient defers creating a medical advance directive at this time - Information given) No - patient declined information Yes - Educational materials given    Current Medications (verified) Outpatient Encounter Medications as of 11/14/2022  Medication Sig   acetaminophen (TYLENOL) 650 MG CR tablet Take 650 mg by mouth every 8 (eight) hours as needed for pain.   albuterol (VENTOLIN HFA) 108 (90 Base) MCG/ACT inhaler Inhale 2 puffs into the lungs every 6 (six) hours as needed for wheezing or shortness of breath.   amphetamine-dextroamphetamine (ADDERALL) 10 MG tablet Take 1 tablet (10 mg total) by mouth 2 (two) times daily.   B Complex-C (B-COMPLEX WITH VITAMIN C) tablet Take 1 tablet by mouth daily.   bisacodyl (DULCOLAX) 5 MG EC tablet Take 5 mg by mouth in the morning.   cholecalciferol (VITAMIN D) 1000 UNITS tablet Take 1,000 Units by mouth daily.   esomeprazole (NEXIUM) 40 MG capsule TAKE 1 CAPSULE DAILY   ferrous sulfate 325 (65 FE) MG EC tablet Take 325 mg by mouth 3 (three) times daily with meals.   levothyroxine (SYNTHROID) 75 MCG tablet TAKE 1 TABLET DAILY BEFORE BREAKFAST   Multiple Vitamin (MULTIVITAMIN WITH MINERALS) TABS tablet Take 1 tablet by mouth  daily. Centrum Silver   naproxen (NAPROSYN) 375 MG tablet TAKE 1 TABLET TWICE A DAY WITH MEALS (Patient taking differently: Take 375 mg by mouth daily.)   polycarbophil (FIBERCON) 625 MG tablet Take 625 mg by mouth daily.   rosuvastatin (CRESTOR) 20 MG tablet TAKE 1 TABLET DAILY (NEED APPOINTMENT)   No facility-administered encounter medications on file as of 11/14/2022.    Allergies (verified) Penicillins   History: Past Medical History:  Diagnosis Date   ADHD (attention deficit hyperactivity disorder) 09/07/2011   Anesthesia complication [id number]   Will need GETA for future endoscopy procedures. Did not tolerate MAC well.   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   Diverticulosis    DVT (deep venous thrombosis) ([place]) 09/07/2011   Hypertension    on no medications   Hypothyroid 09/07/2011   Internal hemorrhoids    Iron deficiency anemia    Personal history of radiation therapy 2012   Pneumonia    Smoker 02/17/2013   Tubular adenoma of colon    Past Surgical History:  Procedure Laterality Date   ABDOMINAL HYSTERECTOMY     BIOPSY  12/04/2019  Procedure: BIOPSY;  Surgeon: Juanita Craver, MD;  Location: [facility];  Service: Endoscopy;;   BIOPSY  06/14/2021   Procedure: BIOPSY;  Surgeon: Irving Copas., MD;  Location: [facility];  Service: Gastroenterology;;   BREAST BIOPSY Left 2013   BREAST LUMPECTOMY  12/2011   Left breast   COLONOSCOPY     COLONOSCOPY WITH PROPOFOL N/A 12/04/2019   Procedure: COLONOSCOPY WITH PROPOFOL;  Surgeon: Juanita Craver, MD;  Location: [facility];  Service: Endoscopy;  Laterality: N/A;   COLONOSCOPY WITH PROPOFOL N/A 09/20/2020   Procedure: COLONOSCOPY WITH PROPOFOL;  Surgeon: Rush Landmark Telford Nab., MD;  Location: [place];  Service: Gastroenterology;  Laterality: N/A;    COLONOSCOPY WITH PROPOFOL N/A 06/14/2021   Procedure: COLONOSCOPY WITH PROPOFOL;  Surgeon: Rush Landmark Telford Nab., MD;  Location: [facility];  Service: Gastroenterology;  Laterality: N/A;   ENDOSCOPIC MUCOSAL RESECTION N/A 09/20/2020   Procedure: ENDOSCOPIC MUCOSAL RESECTION;  Surgeon: Rush Landmark Telford Nab., MD;  Location: [place];  Service: Gastroenterology;  Laterality: N/A;   ESOPHAGOGASTRODUODENOSCOPY (EGD) WITH PROPOFOL N/A 12/04/2019   Procedure: ESOPHAGOGASTRODUODENOSCOPY (EGD) WITH PROPOFOL;  Surgeon: Juanita Craver, MD;  [facility];  Service: Endoscopy;  Laterality: N/A;   ESOPHAGOGASTRODUODENOSCOPY (EGD) WITH PROPOFOL N/A 06/01/2022   Procedure: ESOPHAGOGASTRODUODENOSCOPY (EGD) WITH PROPOFOL;  Surgeon: Rush Landmark Telford Nab., MD;  [facility];  Service: Gastroenterology;  Laterality: N/A;   EYE SURGERY Right 06/2013   Lasix - Laser   GIVENS CAPSULE STUDY N/A 06/01/2022   Procedure: GIVENS CAPSULE STUDY;  Surgeon: Irving Copas., MD;  [facility]: [facility];  Service: Gastroenterology;  Laterality: N/A;   HEMOSTASIS CLIP PLACEMENT  09/20/2020   Procedure: HEMOSTASIS CLIP PLACEMENT;  Surgeon: Irving Copas., MD;  Location: [place];  Service: Gastroenterology;;   HOT HEMOSTASIS N/A 06/01/2022   Procedure: HOT HEMOSTASIS (ARGON PLASMA COAGULATION/BICAP);  Surgeon: Irving Copas., MD;  Location: [facility];  Service: Gastroenterology;  Laterality: N/A;   POLYPECTOMY  12/04/2019   Procedure: POLYPECTOMY;  Surgeon: Juanita Craver, MD;  Location: [facility];  Service: Endoscopy;;   POLYPECTOMY  09/20/2020   Procedure: POLYPECTOMY;  Surgeon: Irving Copas., MD;  Location: [place];  Service: Gastroenterology;;   POLYPECTOMY  06/14/2021   Procedure: POLYPECTOMY;  Surgeon: Irving Copas., MD;  Location: [facility];  Service: Gastroenterology;;   SUBMUCOSAL LIFTING INJECTION  09/20/2020   Procedure:  SUBMUCOSAL LIFTING INJECTION;  Surgeon: Irving Copas., MD;  Location: [facility];  Service: Gastroenterology;;   UPPER GASTROINTESTINAL ENDOSCOPY     Family History  Problem Relation Age of Onset   Stomach cancer Father    Esophageal cancer Father    Diabetes Maternal Grandmother    Crohn's disease Daughter    Ulcerative colitis Daughter    Colon cancer Neg Hx    Pancreatic cancer Neg Hx    Liver disease Neg Hx    Inflammatory bowel disease Neg Hx    Rectal cancer Neg Hx    Social History   Socioeconomic History   Marital status: Divorced    Spouse name: Not on file   Number of children: Not on file   Years of education: Not on file   Highest education level: Not on file  Occupational History   Not on file  Tobacco Use   Smoking status: Every Day    Packs/day: 0.50    Years: 40.00    Total pack years: 20.00    Types: E-cigarettes, Cigarettes   Smokeless tobacco: Never  Vaping Use  Vaping Use: Never used  Substance and Sexual Activity   Alcohol use: Yes    Alcohol/week: 0.0 standard drinks of alcohol    Comment: 4 mixed drinks per week    Drug use: No   Sexual activity: Never  Other Topics Concern   Not on file  Social History Narrative   Not on file   Social Determinants of Health   Financial Resource Strain: Not on file  Food Insecurity: Not on file  Transportation Needs: Not on file  Physical Activity: Not on file  Stress: Not on file  Social Connections: Not on file    Tobacco Counseling Ready to quit: Not Answered Counseling given: Not Answered   Clinical Intake:              How often do you need to have someone help you when you read instructions, pamphlets, or other written materials from your doctor or pharmacy?: (P) 1 - Never  Diabetic?No          Activities of Daily Living    11/10/2022   12:27 PM  In your present state of health, do you have any difficulty performing the following activities:  Hearing? 0   Vision? 0  Difficulty concentrating or making decisions? 0  Walking or climbing stairs? 0  Dressing or bathing? 0  Doing errands, shopping? 0  Preparing Food and eating ? N  Using the Toilet? N  In the past six months, have you accidently leaked urine? Y  Do you have problems with loss of bowel control? N  Managing your Medications? N  Managing your Finances? N  Housekeeping or managing your Housekeeping? N    Patient Care Team: [name], MD as PCP - General (Family Medicine) Delice Bison, Charlestine Massed, NP as Nurse Practitioner (Hematology and Oncology) Mansouraty, Telford Nab., MD as Consulting Physician (Gastroenterology) Early, Arvilla Meres, MD as Consulting Physician (Vascular Surgery)  Indicate any recent Medical Services you may have received from other than [facility] providers in the past year (date may be approximate).     Assessment:   This is a routine wellness examination for [place].  Hearing/Vision screen No results found.  Dietary issues and exercise activities discussed:     Goals Addressed   None    Depression Screen    09/07/2022   11:41 AM 11/10/2021    3:01 PM 10/23/2019    8:30 AM 10/19/2017    3:02 PM  PHQ 2/9 Scores  PHQ - 2 Score 0 0 0 0  PHQ- 9 Score  0      Fall Risk    11/10/2022   12:27 PM 09/07/2022   11:41 AM 11/10/2021    3:01 PM 11/04/2020    8:30 AM 08/04/2014    8:40 AM  [place] in the past year? 0 0 0 0 No  Number falls in past yr: 0 0 0 0   Injury with Fall? 0 0 0 0   Risk for fall due to :  No Fall Risks     Follow up  Falls prevention discussed  Falls evaluation completed     FALL RISK PREVENTION PERTAINING TO THE HOME:  Any stairs in or around the home? {YES/NO:21197} If so, are there any without handrails? {YES/NO:21197} Home free of loose throw rugs in walkways, pet beds, electrical cords, etc? {YES/NO:21197} Adequate lighting in your home to reduce risk of falls? {YES/NO:21197}  ASSISTIVE DEVICES UTILIZED TO  PREVENT FALLS:  Life alert? {YES/NO:21197}  Use of a cane, walker or w/c? {YES/NO:21197} Grab bars in the bathroom? {YES/NO:21197} Shower chair or bench in shower? {YES/NO:21197} Elevated toilet seat or a handicapped toilet? {YES/NO:21197}  TIMED UP AND GO:  Was the test performed? No . Telephonic visit   Cognitive Function:        Immunizations Immunization History  Administered Date(s) Administered   Influenza,inj,Quad PF,6+ Mos 07/20/2014, 09/03/2015, 10/23/2019   Influenza-Unspecified 10/08/2013, 08/16/2016, 08/07/2017, 08/07/2018   PFIZER Comirnaty(Gray Top)Covid-19 Tri-Sucrose Vaccine 12/24/2019, 01/14/2020, 06/30/2020   Pneumococcal Conjugate-13 09/04/2016   Pneumococcal Polysaccharide-23 09/05/2012, 10/21/2018   Zoster Recombinat (Shingrix) 06/19/2017, 10/26/2017    TDAP status: Due, Education has been provided regarding the importance of this vaccine. Advised may receive this vaccine at local pharmacy or Health Dept. Aware to provide a copy of the vaccination record if obtained from local pharmacy or Health Dept. Verbalized acceptance and understanding.  Flu Vaccine status: Up to date  Pneumococcal vaccine status: Up to date  Covid-19 vaccine status: Information provided on how to obtain vaccines.   Qualifies for Shingles Vaccine? Yes   Zostavax completed No   Shingrix Completed?: Yes  Screening Tests Health Maintenance  Topic Date Due   DTaP/Tdap/Td (1 - Tdap) Never done   COVID-19 Vaccine (4 - 2023-24 season) 06/02/2022   Medicare Annual Wellness (AWV)  11/10/2022   Lung Cancer Screening  06/27/2023   Pneumonia Vaccine 43+ Years old (3 of 3 - PPSV23 or PCV20) 10/22/2023   MAMMOGRAM  11/15/2023   COLONOSCOPY (Pts 45-69yr Insurance coverage will need to be confirmed)  06/15/2031   INFLUENZA VACCINE  Completed   DEXA SCAN  Completed   Hepatitis C Screening  Completed   Zoster Vaccines- Shingrix  Completed   HPV VACCINES  Aged Out    Health  Maintenance  Health Maintenance Due  Topic Date Due   DTaP/Tdap/Td (1 - Tdap) Never done   COVID-19 Vaccine (4 - 2023-24 season) 06/02/2022   Medicare Annual Wellness (AWV)  11/10/2022    Colorectal cancer screening: Type of screening: Colonoscopy. Completed 06/14/21. Repeat every 10 years  Mammogram status: Completed 11/14/21. Repeat every year  Bone Density status: Completed 04/18/19. Results reflect: Bone density results: OSTEOPENIA. Repeat every 2 years.  Lung Cancer Screening: (Low Dose CT Chest recommended if Age 67-80years, 30 pack-year currently smoking OR have quit w/in 15years.) does not qualify.   Lung Cancer Screening Referral: n/a   Additional Screening:  Hepatitis C Screening: does qualify; Completed 09/04/16  Vision Screening: Recommended annual ophthalmology exams for early detection of glaucoma and other disorders of the eye. Is the patient up to date with their annual eye exam?  {YES/NO:21197} Who is the provider or what is the name of the office in which the patient attends annual eye exams? *** If pt is not established with a provider, would they like to be referred to a provider to establish care? {YES/NO:21197}.   Dental Screening: Recommended annual dental exams for proper oral hygiene  Community Resource Referral / Chronic Care Management: CRR required this visit?  {YES/NO:21197}  CCM required this visit?  {YES/NO:21197}     Plan:     I have personally reviewed and noted the following in the patient's chart:   Medical and social history Use of alcohol, tobacco or illicit drugs  Current medications and supplements including opioid prescriptions. {Opioid Prescriptions:[phone]} Functional ability and status Nutritional status Physical activity Advanced directives List of other physicians Hospitalizations, surgeries, and ER visits in previous 12 months Vitals Screenings  to include cognitive, depression, and falls Referrals and  appointments  In addition, I have reviewed and discussed with patient certain preventive protocols, quality metrics, and best practice recommendations. A written personalized care plan for preventive services as well as general preventive health recommendations were provided to patient.     Denman George [place], [place]   [id number]   Nurse Notes: ***

## 2022-11-13 NOTE — Patient Instructions (Incomplete)
[name] , Thank you for taking time to come for your Medicare Wellness Visit. I appreciate your ongoing commitment to your health goals. Please review the following plan we discussed and let me know if I can assist you in the future.   These are the goals we discussed:  Goals   None     This is a list of the screening recommended for you and due dates:  Health Maintenance  Topic Date Due   DTaP/Tdap/Td vaccine (1 - Tdap) Never done   COVID-19 Vaccine (4 - 2023-24 season) 06/02/2022   Medicare Annual Wellness Visit  11/10/2022   Screening for Lung Cancer  06/27/2023   Pneumonia Vaccine (3 of 3 - PPSV23 or PCV20) 10/22/2023   Mammogram  11/15/2023   Colon Cancer Screening  06/15/2031   Flu Shot  Completed   DEXA scan (bone density measurement)  Completed   Hepatitis C Screening: USPSTF Recommendation to screen - Ages 18-79 yo.  Completed   Zoster (Shingles) Vaccine  Completed   HPV Vaccine  Aged Out    Advanced directives:   Conditions/risks identified: Aim for 30 minutes of exercise or brisk walking, 6-8 glasses of water, and 5 servings of fruits and vegetables each day.   Next appointment: Follow up in one year for your annual wellness visit    Preventive Care 65 Years and Older, Female Preventive care refers to lifestyle choices and visits with your health care provider that can promote health and wellness. What does preventive care include? A yearly physical exam. This is also called an annual well check. Dental exams once or twice a year. Routine eye exams. Ask your health care provider how often you should have your eyes checked. Personal lifestyle choices, including: Daily care of your teeth and gums. Regular physical activity. Eating a healthy diet. Avoiding tobacco and drug use. Limiting alcohol use. Practicing safe sex. Taking low-dose aspirin every day. Taking vitamin and mineral supplements as recommended by your health care provider. What happens during an  annual well check? The services and screenings done by your health care provider during your annual well check will depend on your age, overall health, lifestyle risk factors, and family history of disease. Counseling  Your health care provider may ask you questions about your: Alcohol use. Tobacco use. Drug use. Emotional well-being. Home and relationship well-being. Sexual activity. Eating habits. History of falls. Memory and ability to understand (cognition). Work and work Statistician. Reproductive health. Screening  You may have the following tests or measurements: Height, weight, and BMI. Blood pressure. Lipid and cholesterol levels. These may be checked every 5 years, or more frequently if you are over 42 years old. Skin check. Lung cancer screening. You may have this screening every year starting at age 67 if you have a 30-pack-year history of smoking and currently smoke or have quit within the past 15 years. Fecal occult blood test (FOBT) of the stool. You may have this test every year starting at age 67. Flexible sigmoidoscopy or colonoscopy. You may have a sigmoidoscopy every 5 years or a colonoscopy every 10 years starting at age 67. Hepatitis C blood test. Hepatitis B blood test. Sexually transmitted disease (STD) testing. Diabetes screening. This is done by checking your blood sugar (glucose) after you have not eaten for a while (fasting). You may have this done every 1-3 years. Bone density scan. This is done to screen for osteoporosis. You may have this done starting at age 71. Mammogram. This may be  done every 1-2 years. Talk to your health care provider about how often you should have regular mammograms. Talk with your health care provider about your test results, treatment options, and if necessary, the need for more tests. Vaccines  Your health care provider may recommend certain vaccines, such as: Influenza vaccine. This is recommended every year. Tetanus,  diphtheria, and acellular pertussis (Tdap, Td) vaccine. You may need a Td booster every 10 years. Zoster vaccine. You may need this after age 67. Pneumococcal 13-valent conjugate (PCV13) vaccine. One dose is recommended after age 67. Pneumococcal polysaccharide (PPSV23) vaccine. One dose is recommended after age 67. Talk to your health care provider about which screenings and vaccines you need and how often you need them. This information is not intended to replace advice given to you by your health care provider. Make sure you discuss any questions you have with your health care provider. Document Released: 10/15/2015 Document Revised: 06/07/2016 Document Reviewed: 07/20/2015 Elsevier Interactive Patient Education  2017 [place] Prevention in the Home Falls can cause injuries. They can happen to people of all ages. There are many things you can do to make your home safe and to help prevent falls. What can I do on the outside of my home? Regularly fix the edges of walkways and driveways and fix any cracks. Remove anything that might make you trip as you walk through a door, such as a raised step or threshold. Trim any bushes or trees on the path to your home. Use bright outdoor lighting. Clear any walking paths of anything that might make someone trip, such as rocks or tools. Regularly check to see if handrails are loose or broken. Make sure that both sides of any steps have handrails. Any raised decks and porches should have guardrails on the edges. Have any leaves, snow, or ice cleared regularly. Use sand or salt on walking paths during winter. Clean up any spills in your garage right away. This includes oil or grease spills. What can I do in the bathroom? Use night lights. Install grab bars by the toilet and in the tub and shower. Do not use towel bars as grab bars. Use non-skid mats or decals in the tub or shower. If you need to sit down in the shower, use a plastic,  non-slip stool. Keep the floor dry. Clean up any water that spills on the floor as soon as it happens. Remove soap buildup in the tub or shower regularly. Attach bath mats securely with double-sided non-slip rug tape. Do not have throw rugs and other things on the floor that can make you trip. What can I do in the bedroom? Use night lights. Make sure that you have a light by your bed that is easy to reach. Do not use any sheets or blankets that are too big for your bed. They should not hang down onto the floor. Have a firm chair that has side arms. You can use this for support while you get dressed. Do not have throw rugs and other things on the floor that can make you trip. What can I do in the kitchen? Clean up any spills right away. Avoid walking on wet floors. Keep items that you use a lot in easy-to-reach places. If you need to reach something above you, use a strong step stool that has a grab bar. Keep electrical cords out of the way. Do not use floor polish or wax that makes floors slippery. If you must use wax,  use non-skid floor wax. Do not have throw rugs and other things on the floor that can make you trip. What can I do with my stairs? Do not leave any items on the stairs. Make sure that there are handrails on both sides of the stairs and use them. Fix handrails that are broken or loose. Make sure that handrails are as long as the stairways. Check any carpeting to make sure that it is firmly attached to the stairs. Fix any carpet that is loose or worn. Avoid having throw rugs at the top or bottom of the stairs. If you do have throw rugs, attach them to the floor with carpet tape. Make sure that you have a light switch at the top of the stairs and the bottom of the stairs. If you do not have them, ask someone to add them for you. What else can I do to help prevent falls? Wear shoes that: Do not have high heels. Have rubber bottoms. Are comfortable and fit you well. Are closed  at the toe. Do not wear sandals. If you use a stepladder: Make sure that it is fully opened. Do not climb a closed stepladder. Make sure that both sides of the stepladder are locked into place. Ask someone to hold it for you, if possible. Clearly mark and make sure that you can see: Any grab bars or handrails. First and last steps. Where the edge of each step is. Use tools that help you move around (mobility aids) if they are needed. These include: Canes. Walkers. Scooters. Crutches. Turn on the lights when you go into a dark area. Replace any light bulbs as soon as they burn out. Set up your furniture so you have a clear path. Avoid moving your furniture around. If any of your floors are uneven, fix them. If there are any pets around you, be aware of where they are. Review your medicines with your doctor. Some medicines can make you feel dizzy. This can increase your chance of falling. Ask your doctor what other things that you can do to help prevent falls. This information is not intended to replace advice given to you by your health care provider. Make sure you discuss any questions you have with your health care provider. Document Released: 07/15/2009 Document Revised: 02/24/2016 Document Reviewed: 10/23/2014 Elsevier Interactive Patient Education  2017 [employer].

## 2022-11-14 ENCOUNTER — Ambulatory Visit (INDEPENDENT_AMBULATORY_CARE_PROVIDER_SITE_OTHER): Payer: Medicare Other

## 2022-11-14 VITALS — Ht 65.0 in | Wt 122.0 lb

## 2022-11-14 DIAGNOSIS — Z0000 Encounter for general adult medical examination without abnormal findings: Secondary | ICD-10-CM | POA: Diagnosis not present

## 2022-11-21 ENCOUNTER — Encounter: Payer: Self-pay | Admitting: Adult Health

## 2022-11-21 ENCOUNTER — Inpatient Hospital Stay: Payer: Medicare Other | Attending: Adult Health | Admitting: Adult Health

## 2022-11-21 VITALS — BP 157/79 | HR 80 | Temp 97.8°F | Resp 16 | Ht 65.0 in | Wt 123.7 lb

## 2022-11-21 DIAGNOSIS — Z1231 Encounter for screening mammogram for malignant neoplasm of breast: Secondary | ICD-10-CM | POA: Diagnosis not present

## 2022-11-21 DIAGNOSIS — C50412 Malignant neoplasm of upper-outer quadrant of left female breast: Secondary | ICD-10-CM | POA: Insufficient documentation

## 2022-11-21 DIAGNOSIS — I10 Essential (primary) hypertension: Secondary | ICD-10-CM | POA: Insufficient documentation

## 2022-11-21 DIAGNOSIS — Z7189 Other specified counseling: Secondary | ICD-10-CM

## 2022-11-21 DIAGNOSIS — M8589 Other specified disorders of bone density and structure, multiple sites: Secondary | ICD-10-CM | POA: Insufficient documentation

## 2022-11-21 DIAGNOSIS — Z79811 Long term (current) use of aromatase inhibitors: Secondary | ICD-10-CM | POA: Diagnosis not present

## 2022-11-21 DIAGNOSIS — F17210 Nicotine dependence, cigarettes, uncomplicated: Secondary | ICD-10-CM | POA: Diagnosis not present

## 2022-11-21 DIAGNOSIS — D509 Iron deficiency anemia, unspecified: Secondary | ICD-10-CM | POA: Diagnosis not present

## 2022-11-21 DIAGNOSIS — E039 Hypothyroidism, unspecified: Secondary | ICD-10-CM | POA: Insufficient documentation

## 2022-11-21 DIAGNOSIS — F17200 Nicotine dependence, unspecified, uncomplicated: Secondary | ICD-10-CM | POA: Diagnosis not present

## 2022-11-21 DIAGNOSIS — Z923 Personal history of irradiation: Secondary | ICD-10-CM | POA: Diagnosis not present

## 2022-11-21 DIAGNOSIS — Z170 Estrogen receptor positive status [ER+]: Secondary | ICD-10-CM | POA: Diagnosis not present

## 2022-11-21 DIAGNOSIS — Z86718 Personal history of other venous thrombosis and embolism: Secondary | ICD-10-CM | POA: Insufficient documentation

## 2022-11-21 NOTE — Assessment & Plan Note (Signed)
States he has no clinical or radiographic signs of breast cancer recurrence.  I recommended that she proceed with annual mammograms and her next one is due this month.  I placed orders for this to happen in the next few weeks at the breast center.  I will continue to see her for long-term follow-up.

## 2022-11-21 NOTE — Assessment & Plan Note (Signed)
States he has a history of osteopenia in her bilateral femurs along with in her radius.  Her most recent bone density testing occurred in July 2020.  We discussed her risk factors for osteopenia which include previous anastrozole use, tobacco use, and being thin and Caucasian.  I placed orders for repeat bone density testing which will occur at the breast center since that is where she went previously.  We discussed bone health which includes calcium vitamin D and weightbearing exercises.

## 2022-11-21 NOTE — Assessment & Plan Note (Signed)
#  lung cancer screening  I discussed screening Low Dose Ct chest without contrast for early detection of lung cancer in this Counseling and Shared Decision-Making Visit Patient [name] with 11/23/1955 and Age 67 y.o. years met the following criteria and I counseled that in  A) age 36-75 AND B) smoking history of 20 pack year smoking AND C) Current smoker or one who has quit smoking within the last 15 years  I counseled  - Annual low dose CT chest can pick up lung cancer early and has potential to save lives and cure lung cancer - This is similar in concept to screening mammogram, colonoscopies and pap smears - I explained Ct scan chest is low dose radiation CT chest - I explained early lung cancer asymptomatic and only way to  detect is CT  With the real advantage that early lung cancer is curable through radiation or surgery - I explained CT superior to CXR - I explained that false positives are present and can incur cost and workup like biopsies, additional scan but benefit outweighs risk - I counseled that patient should QUIT SMOKING - I counseled that patient should adhered to protocol requirements of scan and followup scans

## 2022-11-21 NOTE — Progress Notes (Signed)
[facility] Follow up:    Jean Frizzle, MD [address] [place] [address]   DIAGNOSIS:   SUMMARY OF ONCOLOGIC HISTORY: Oncology History  Breast cancer of upper-outer quadrant of left female breast ([place])  01/30/2007 Surgery   Left breast lumpectomy: High-grade DCIS with focal necrosis 1.7 cm ER 54%, PR 74%   03/26/2007 - 05/13/2007 Radiation Therapy   Adjuvant radiation therapy   06/05/2007 - 06/05/2012 Anti-estrogen oral therapy   Arimidex 1 mg daily     CURRENT THERAPY: Observation  INTERVAL HISTORY: [name] 67 y.o. female returns for annual follow-up of her history of left-sided estrogen positive breast cancer.  She continues on observation alone.  States he tells me that she is doing quite well.  She cannot recall when her most recent mammogram was.  She continues to follow-up with Dr. Dennard Schaumann.  Her blood pressure today is 157/79.  She has a blood pressure cuff at home but she has not been consistently using this.  I calculated her ASCVD risk today and it calculated at 11.1% which is moderate risk.  She undergoes annual lung cancer screening because she is a current smoker with a 40+ pack year tobacco history.  Her most recent low-dose CT scan occurred on June 28, 2022 demonstrating lung RADS 2, benign appearance or behavior.  She was recommended to continue screening with low-dose chest CT.  Incidentally aortic atherosclerosis and coronary artery calcification was noted.  Her most recent screening mammogram occurred on November 15, 2021 demonstrating no mammographic evidence of malignancy and breast density category B.   Patient Active Problem List   Diagnosis Date Noted   Cardiac risk counseling 11/21/2022   Osteopenia of multiple sites 11/21/2022   AVM (arteriovenous malformation) of small bowel, acquired with hemorrhage    Positive FIT (fecal immunochemical test) 11/18/2021   History of colonic polyps 11/18/2021   Atrophic  gastritis 11/03/2021   Chronic idiopathic constipation 11/03/2021   Diverticular disease of colon 11/03/2021   Iron deficiency anemia 11/03/2021   Personal history of colonic polyps 11/03/2021   Carpal tunnel syndrome of right wrist 10/05/2017   Coronary artery calcification seen on CAT scan    Smoker 02/17/2013   Occlusion and stenosis of carotid artery without mention of cerebral infarction 09/23/2012   Breast cancer of upper-outer quadrant of left female breast ([facility]) 09/07/2011   ADHD (attention deficit hyperactivity disorder) 09/07/2011   DVT (deep venous thrombosis) ([place]) 09/07/2011   Hypothyroid 09/07/2011    is allergic to penicillins.  MEDICAL HISTORY: Past Medical History:  Diagnosis Date   ADHD (attention deficit hyperactivity disorder) 09/07/2011   Anesthesia complication [id number]   Will need GETA for future endoscopy procedures. Did not tolerate MAC well.   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   Diverticulosis    DVT (deep venous thrombosis) ([facility]) 09/07/2011   Hypertension    on no medications   Hypothyroid 09/07/2011   Internal hemorrhoids    Iron deficiency anemia    Personal history of radiation therapy 2012   Pneumonia    Smoker 02/17/2013   Tubular adenoma of colon     SURGICAL HISTORY: Past Surgical History:  Procedure Laterality Date   ABDOMINAL HYSTERECTOMY     BIOPSY  12/04/2019  Procedure: BIOPSY;  Surgeon: Juanita Craver, MD;  Location: [facility];  Service: Endoscopy;;   BIOPSY  06/14/2021   Procedure: BIOPSY;  Surgeon: Irving Copas., MD;  Location: [facility];  Service: Gastroenterology;;   BREAST BIOPSY Left 2013   BREAST LUMPECTOMY  12/2011   Left breast   COLONOSCOPY     COLONOSCOPY WITH PROPOFOL N/A 12/04/2019   Procedure: COLONOSCOPY WITH PROPOFOL;  Surgeon:  Juanita Craver, MD;  Location: [facility];  Service: Endoscopy;  Laterality: N/A;   COLONOSCOPY WITH PROPOFOL N/A 09/20/2020   Procedure: COLONOSCOPY WITH PROPOFOL;  Surgeon: Rush Landmark Telford Nab., MD;  Location: [facility];  Service: Gastroenterology;  Laterality: N/A;   COLONOSCOPY WITH PROPOFOL N/A 06/14/2021   Procedure: COLONOSCOPY WITH PROPOFOL;  Surgeon: Rush Landmark Telford Nab., MD;  Location: [facility];  Service: Gastroenterology;  Laterality: N/A;   ENDOSCOPIC MUCOSAL RESECTION N/A 09/20/2020   Procedure: ENDOSCOPIC MUCOSAL RESECTION;  Surgeon: Rush Landmark Telford Nab., MD;  Location: [facility];  Service: Gastroenterology;  Laterality: N/A;   ESOPHAGOGASTRODUODENOSCOPY (EGD) WITH PROPOFOL N/A 12/04/2019   Procedure: ESOPHAGOGASTRODUODENOSCOPY (EGD) WITH PROPOFOL;  Surgeon: Juanita Craver, MD;  Location: [facility];  Service: Endoscopy;  Laterality: N/A;   ESOPHAGOGASTRODUODENOSCOPY (EGD) WITH PROPOFOL N/A 06/01/2022   Procedure: ESOPHAGOGASTRODUODENOSCOPY (EGD) WITH PROPOFOL;  Surgeon: Rush Landmark Telford Nab., MD;  Location: [facility];  Service: Gastroenterology;  Laterality: N/A;   EYE SURGERY Right 06/2013   Lasix - Laser   GIVENS CAPSULE STUDY N/A 06/01/2022   Procedure: GIVENS CAPSULE STUDY;  Surgeon: Irving Copas., MD;  Location: [facility];  Service: Gastroenterology;  Laterality: N/A;   HEMOSTASIS CLIP PLACEMENT  09/20/2020   Procedure: HEMOSTASIS CLIP PLACEMENT;  Surgeon: Irving Copas., MD;  Location: [facility];  Service: Gastroenterology;;   HOT HEMOSTASIS N/A 06/01/2022   Procedure: HOT HEMOSTASIS (ARGON PLASMA COAGULATION/BICAP);  Surgeon: Irving Copas., MD;  Location: [facility];  Service: Gastroenterology;  Laterality: N/A;   POLYPECTOMY  12/04/2019   Procedure: POLYPECTOMY;  Surgeon: Juanita Craver, MD;  Location: [facility];  Service: Endoscopy;;   POLYPECTOMY  09/20/2020   Procedure: POLYPECTOMY;  Surgeon: Irving Copas., MD;  Location: [facility];  Service: Gastroenterology;;   POLYPECTOMY  06/14/2021   Procedure: POLYPECTOMY;  Surgeon: Irving Copas., MD;  Location: [facility];  Service: Gastroenterology;;   SUBMUCOSAL LIFTING INJECTION  09/20/2020   Procedure: SUBMUCOSAL LIFTING INJECTION;  Surgeon: Irving Copas., MD;  [facility];  Service: Gastroenterology;;   UPPER GASTROINTESTINAL ENDOSCOPY      SOCIAL HISTORY: Social History   Socioeconomic History   Marital status: Divorced    Spouse name: Not on file   Number of children: Not on file   Years of education: Not on file   Highest education level: Not on file  Occupational History   Not on file  Tobacco Use   Smoking status: Every Day    Packs/day: 0.50    Years: 40.00    Total pack years: 20.00    Types: E-cigarettes, Cigarettes   Smokeless tobacco: Never  Vaping Use   Vaping Use: Never used  Substance and Sexual Activity   Alcohol use: Yes    Alcohol/week: 0.0 standard drinks of alcohol    Comment: 4 mixed drinks per week    Drug use: No   Sexual activity: Never  Other Topics Concern   Not on file  Social History Narrative   Not on file   Social Determinants of Health   Financial Resource Strain: Low  Risk  (11/14/2022)   Overall Financial Resource Strain (CARDIA)    Difficulty of Paying Living Expenses: Not hard at all  Food Insecurity: No Food Insecurity (11/14/2022)   Hunger Vital Sign    Worried About Running Out of Food in the Last Year: Never true    Ran Out of Food in the Last Year: Never true  Transportation Needs: No Transportation Needs (11/14/2022)   PRAPARE - Hydrologist (Medical): No    Lack of Transportation (Non-Medical): No  Physical Activity: Sufficiently Active (11/14/2022)   Exercise Vital Sign    Days of Exercise per Week: 7 days    Minutes of Exercise per Session: 30 min  Stress: No Stress Concern Present (11/14/2022)   [place]    Feeling of Stress : Not at all  Social Connections: Moderately Integrated (11/14/2022)   Social Connection and Isolation Panel [NHANES]    Frequency of Communication with Friends and Family: Once a week    Frequency of Social Gatherings with Friends and Family: Three times a week    Attends Religious Services: 1 to 4 times per year    Active Member of Clubs or Organizations: Yes    Attends Archivist Meetings: More than 4 times per year    Marital Status: Divorced  Intimate Partner Violence: Not At Risk (11/14/2022)   Humiliation, Afraid, Rape, and Kick questionnaire    Fear of Current or Ex-Partner: No    Emotionally Abused: No    Physically Abused: No    Sexually Abused: No    FAMILY HISTORY: Family History  Problem Relation Age of Onset   Stomach cancer Father    Esophageal cancer Father    Diabetes Maternal Grandmother    Crohn's disease Daughter    Ulcerative colitis Daughter    Colon cancer Neg Hx    Pancreatic cancer Neg Hx    Liver disease Neg Hx    Inflammatory bowel disease Neg Hx    Rectal cancer Neg Hx     Review of Systems  Constitutional:  Negative for appetite change, chills, fatigue, fever and unexpected weight change.  HENT:   Negative for hearing loss, lump/mass and trouble swallowing.   Eyes:  Negative for eye problems and icterus.  Respiratory:  Negative for chest tightness, cough and shortness of breath.   Cardiovascular:  Negative for chest pain, leg swelling and palpitations.  Gastrointestinal:  Negative for abdominal distention, abdominal pain, constipation, diarrhea, nausea and vomiting.  Endocrine: Negative for hot flashes.  Genitourinary:  Negative for difficulty urinating.   Musculoskeletal:  Negative for arthralgias.  Skin:  Negative for itching and rash.  Neurological:  Negative for dizziness, extremity weakness, headaches and numbness.  Hematological:  Negative  for adenopathy. Does not bruise/bleed easily.  Psychiatric/Behavioral:  Negative for depression. The patient is not nervous/anxious.       PHYSICAL EXAMINATION  ECOG PERFORMANCE STATUS: 0 - Asymptomatic  Vitals:   11/21/22 0851  BP: (!) 157/79  Pulse: 80  Resp: 16  Temp: 97.8 F (36.6 C)  SpO2: 96%    Physical Exam Constitutional:      General: She is not in acute distress.    Appearance: Normal appearance. She is not toxic-appearing.  HENT:     Head: Normocephalic and atraumatic.  Eyes:     General: No scleral icterus. Cardiovascular:     Rate and Rhythm: Normal rate and regular rhythm.  Pulses: Normal pulses.     Heart sounds: Normal heart sounds.  Pulmonary:     Effort: Pulmonary effort is normal.     Breath sounds: Normal breath sounds.  Chest:     Comments: Left breast is benign right breast status postlumpectomy and radiation no sign of local recurrence. Abdominal:     General: Abdomen is flat. Bowel sounds are normal. There is no distension.     Palpations: Abdomen is soft.     Tenderness: There is no abdominal tenderness.  Musculoskeletal:        General: No swelling.     Cervical back: Neck supple.  Lymphadenopathy:     Cervical: No cervical adenopathy.  Skin:    General: Skin is warm and dry.     Findings: No rash.  Neurological:     General: No focal deficit present.     Mental Status: She is alert.  Psychiatric:        Mood and Affect: Mood normal.        Behavior: Behavior normal.     LABORATORY DATA: None for this visit ASSESSMENT and THERAPY PLAN:   Cardiac risk counseling Her ASCVD risk is 11.1%.  Recommended that her blood pressure today is elevated and suggested that she continue to monitor her blood pressure at home with the cuff she has and follow-up with Dr. Dennard Schaumann if it stays consistently above 140/90.  We also discussed smoking cessation and she is not yet ready to quit.  We reviewed healthy diet and exercise along with  staying on her statin.  She verbalized understanding of the above and will continue to follow-up with her primary care Dr. Jewel Baize about this.  Breast cancer of upper-outer quadrant of left female breast States he has no clinical or radiographic signs of breast cancer recurrence.  I recommended that she proceed with annual mammograms and her next one is due this month.  I placed orders for this to happen in the next few weeks at the breast center.  I will continue to see her for long-term follow-up.  Smoker #lung cancer screening  I discussed screening Low Dose Ct chest without contrast for early detection of lung cancer in this Counseling and Shared Decision-Making Visit Patient [name] with 1956-08-24 and Age 19 y.o. years met the following criteria and I counseled that in  A) age 69-75 AND B) smoking history of 20 pack year smoking AND C) Current smoker or one who has quit smoking within the last 15 years  I counseled  - Annual low dose CT chest can pick up lung cancer early and has potential to save lives and cure lung cancer - This is similar in concept to screening mammogram, colonoscopies and pap smears - I explained Ct scan chest is low dose radiation CT chest - I explained early lung cancer asymptomatic and only way to  detect is CT  With the real advantage that early lung cancer is curable through radiation or surgery - I explained CT superior to CXR - I explained that false positives are present and can incur cost and workup like biopsies, additional scan but benefit outweighs risk - I counseled that patient should QUIT SMOKING - I counseled that patient should adhered to protocol requirements of scan and followup scans    Osteopenia of multiple sites States he has a history of osteopenia in her bilateral femurs along with in her radius.  Her most recent bone density testing occurred in July 2020.  We discussed her risk factors for osteopenia which include previous  anastrozole use, tobacco use, and being thin and Caucasian.  I placed orders for repeat bone density testing which will occur at the breast center since that is where she went previously.  We discussed bone health which includes calcium vitamin D and weightbearing exercises.  States he verbalized understanding of the above and agrees with it.  We will see her back in 1 year for continued long-term follow-up.  She knows to call if any questions or concerns arise as we are always happy to see her sooner.  Total encounter time:30 minutes*in face-to-face visit time, chart review, lab review, care coordination, order entry, and documentation of the encounter time.    Wilber Bihari, NP 11/21/22 9:41 AM Medical Oncology and Hematology [facility] [place], [place] [address] Tel. [phone]    Fax. [phone]  *Total Encounter Time as defined by the Centers for Medicare and Medicaid Services includes, in addition to the face-to-face time of a patient visit (documented in the note above) non-face-to-face time: obtaining and reviewing outside history, ordering and reviewing medications, tests or procedures, care coordination (communications with other health care professionals or caregivers) and documentation in the medical record.  This note was electronically signed. Scot Dock, NP 11/21/2022

## 2022-11-21 NOTE — Assessment & Plan Note (Signed)
Her ASCVD risk is 11.1%.  Recommended that her blood pressure today is elevated and suggested that she continue to monitor her blood pressure at home with the cuff she has and follow-up with Dr. Dennard Schaumann if it stays consistently above 140/90.  We also discussed smoking cessation and she is not yet ready to quit.  We reviewed healthy diet and exercise along with staying on her statin.  She verbalized understanding of the above and will continue to follow-up with her primary care Dr. Jewel Baize about this.

## 2022-12-01 ENCOUNTER — Other Ambulatory Visit: Payer: Medicare Other

## 2022-12-01 ENCOUNTER — Other Ambulatory Visit: Payer: Self-pay | Admitting: Family Medicine

## 2022-12-01 ENCOUNTER — Encounter: Payer: Self-pay | Admitting: Family Medicine

## 2022-12-01 DIAGNOSIS — E039 Hypothyroidism, unspecified: Secondary | ICD-10-CM

## 2022-12-01 DIAGNOSIS — D500 Iron deficiency anemia secondary to blood loss (chronic): Secondary | ICD-10-CM

## 2022-12-01 DIAGNOSIS — Z13220 Encounter for screening for lipoid disorders: Secondary | ICD-10-CM

## 2022-12-01 MED ORDER — AMPHETAMINE-DEXTROAMPHETAMINE 10 MG PO TABS: 10.0000 mg | ORAL_TABLET | Freq: Two times a day (BID) | ORAL | 0 refills | Status: DC

## 2022-12-02 LAB — COMPLETE METABOLIC PANEL WITH GFR
AG Ratio: 1.8 (calc) (ref 1.0–2.5)
ALT: 27 U/L (ref 6–29)
AST: 29 U/L (ref 10–35)
Albumin: 4.3 g/dL (ref 3.6–5.1)
Alkaline phosphatase (APISO): 85 U/L (ref 37–153)
BUN: 18 mg/dL (ref 7–25)
CO2: 27 mmol/L (ref 20–32)
Calcium: 9.7 mg/dL (ref 8.6–10.4)
Chloride: 101 mmol/L (ref 98–110)
Creat: 0.6 mg/dL (ref 0.50–1.05)
Globulin: 2.4 g/dL (calc) (ref 1.9–3.7)
Glucose, Bld: 87 mg/dL (ref 65–99)
Potassium: 4.5 mmol/L (ref 3.5–5.3)
Sodium: 137 mmol/L (ref 135–146)
Total Bilirubin: 0.4 mg/dL (ref 0.2–1.2)
Total Protein: 6.7 g/dL (ref 6.1–8.1)
eGFR: 99 mL/min/{1.73_m2} (ref >=60)

## 2022-12-02 LAB — LIPID PANEL
Cholesterol: 152 mg/dL (ref <200)
HDL: 91 mg/dL (ref >=50)
LDL Cholesterol (Calc): 48 mg/dL (calc)
Non-HDL Cholesterol (Calc): 61 mg/dL (calc) (ref <130)
Total CHOL/HDL Ratio: 1.7 (calc) (ref <5.0)
Triglycerides: 57 mg/dL (ref <150)

## 2022-12-02 LAB — CBC WITH DIFFERENTIAL/PLATELET
Absolute Monocytes: 635 cells/uL (ref 200–950)
Basophils Absolute: 69 cells/uL (ref 0–200)
Basophils Relative: 1 %
Eosinophils Absolute: 476 cells/uL (ref 15–500)
Eosinophils Relative: 6.9 %
HCT: 39.3 % (ref 35.0–45.0)
Hemoglobin: 13.3 g/dL (ref 11.7–15.5)
Lymphs Abs: 1484 cells/uL (ref 850–3900)
MCH: 31.9 pg (ref 27.0–33.0)
MCHC: 33.8 g/dL (ref 32.0–36.0)
MCV: 94.2 fL (ref 80.0–100.0)
MPV: 10.3 fL (ref 7.5–12.5)
Monocytes Relative: 9.2 %
Neutro Abs: 4237 cells/uL (ref 1500–7800)
Neutrophils Relative %: 61.4 %
Platelets: 514 10*3/uL — ABNORMAL HIGH (ref 140–400)
RBC: 4.17 10*6/uL (ref 3.80–5.10)
RDW: 12.8 % (ref 11.0–15.0)
Total Lymphocyte: 21.5 %
WBC: 6.9 10*3/uL (ref 3.8–10.8)

## 2022-12-02 LAB — TSH: TSH: 1.67 mIU/L (ref 0.40–4.50)

## 2022-12-08 ENCOUNTER — Encounter: Payer: Medicare Other | Admitting: Family Medicine

## 2022-12-11 ENCOUNTER — Other Ambulatory Visit: Payer: Medicare Other

## 2022-12-12 ENCOUNTER — Encounter: Payer: Self-pay | Admitting: Family Medicine

## 2022-12-12 ENCOUNTER — Ambulatory Visit
Admission: RE | Admit: 2022-12-12 | Discharge: 2022-12-12 | Disposition: A | Discharge status: Home / Self Care | Payer: Medicare Other | Source: Ambulatory Visit | Attending: Adult Health | Admitting: Adult Health

## 2022-12-12 ENCOUNTER — Ambulatory Visit (INDEPENDENT_AMBULATORY_CARE_PROVIDER_SITE_OTHER): Payer: Medicare Other | Admitting: Family Medicine

## 2022-12-12 VITALS — BP 136/74 | HR 83 | Temp 97.9°F | Ht 65.5 in | Wt 123.4 lb

## 2022-12-12 DIAGNOSIS — Z0000 Encounter for general adult medical examination without abnormal findings: Secondary | ICD-10-CM

## 2022-12-12 DIAGNOSIS — E039 Hypothyroidism, unspecified: Secondary | ICD-10-CM | POA: Diagnosis not present

## 2022-12-12 DIAGNOSIS — F17200 Nicotine dependence, unspecified, uncomplicated: Secondary | ICD-10-CM | POA: Diagnosis not present

## 2022-12-12 DIAGNOSIS — Z1231 Encounter for screening mammogram for malignant neoplasm of breast: Secondary | ICD-10-CM

## 2022-12-12 DIAGNOSIS — D508 Other iron deficiency anemias: Secondary | ICD-10-CM | POA: Diagnosis not present

## 2022-12-12 DIAGNOSIS — F909 Attention-deficit hyperactivity disorder, unspecified type: Secondary | ICD-10-CM

## 2022-12-12 DIAGNOSIS — I2510 Atherosclerotic heart disease of native coronary artery without angina pectoris: Secondary | ICD-10-CM

## 2022-12-12 NOTE — Progress Notes (Signed)
Subjective:    Patient ID: [name], female    DOB: [birth year], 67 y.o.   MRN: [id number]  HPI  Patient is a 67 year old Caucasian female here today for complete physical exam.  We reviewed her shot record.  She has had both Pneumovax 23 and Prevnar 13.  Is been 4 years since her last dose of Pneumovax 23.  I recommended a booster next year and this will be her last pneumonia shot.  She has had shingles vaccine.  She has not had the most recent COVID-vaccine which I did recommend to her.  She does have RSV vaccine.  With regards to her cancer screening she is scheduled for mammogram this afternoon.  Over the last few years she has been dealing with episodic GI bleeding.  She has had a colonoscopy for that done within the last year.  She also had an EGD as well as capsule endoscopy.  No source of bleeding has been identified.  Although her stools remain occasionally positive for blood there has been no frank bleeding.  She has been taking iron intermittently and her most recent hemoglobin is stable at 13.3.  Essentially we have decided to monitor her hemoglobin and iron every 3 months and as long as it is stable make no changes.  If her hemoglobin begins to drop we will resume iron and if it drops substantially she would need a referral to [facility] for double-balloon endoscopy.  Patient is comfortable with this plan.  She has a history of carotid artery stenosis however I informed carotid Dopplers last year and no significant stenosis was seen.  I do not appreciate any bruit today on exam so we have decided to defer doing that test until next year.  She does have a history of a pulmonary nodule.  She is due for her next CT scan of her lung because of her smoking in September of this year.  She denies any chest pain shortness of breath or dyspnea on exertion.  Her most recent blood work is listed below Lab on 12/01/2022  Component Date Value Ref Range Status   WBC 12/01/2022 6.9  3.8 - 10.8 Thousand/uL  Final   RBC 12/01/2022 4.17  3.80 - 5.10 Million/uL Final   Hemoglobin 12/01/2022 13.3  11.7 - 15.5 g/dL Final   HCT 12/01/2022 39.3  35.0 - 45.0 % Final   MCV 12/01/2022 94.2  80.0 - 100.0 fL Final   MCH 12/01/2022 31.9  27.0 - 33.0 pg Final   MCHC 12/01/2022 33.8  32.0 - 36.0 g/dL Final   RDW 12/01/2022 12.8  11.0 - 15.0 % Final   Platelets 12/01/2022 514 (H)  140 - 400 Thousand/uL Final   MPV 12/01/2022 10.3  7.5 - 12.5 fL Final   Neutro Abs 12/01/2022 4,237  1,500 - 7,800 cells/uL Final   Lymphs Abs 12/01/2022 1,484  850 - 3,900 cells/uL Final   Absolute Monocytes 12/01/2022 635  200 - 950 cells/uL Final   Eosinophils Absolute 12/01/2022 476  15 - 500 cells/uL Final   Basophils Absolute 12/01/2022 69  0 - 200 cells/uL Final   Neutrophils Relative % 12/01/2022 61.4  % Final   Total Lymphocyte 12/01/2022 21.5  % Final   Monocytes Relative 12/01/2022 9.2  % Final   Eosinophils Relative 12/01/2022 6.9  % Final   Basophils Relative 12/01/2022 1.0  % Final   Glucose, Bld 12/01/2022 87  65 - 99 mg/dL Final   Comment: .  Fasting reference interval .    BUN 12/01/2022 18  7 - 25 mg/dL Final   Creat 12/01/2022 0.60  0.50 - 1.05 mg/dL Final   eGFR 12/01/2022 99  > OR = 60 mL/min/1.52m Final   BUN/Creatinine Ratio 12/01/2022 SEE NOTE:  6 - 22 (calc) Final   Comment:    Not Reported: BUN and Creatinine are within    reference range. .    Sodium 12/01/2022 137  135 - 146 mmol/L Final   Potassium 12/01/2022 4.5  3.5 - 5.3 mmol/L Final   Chloride 12/01/2022 101  98 - 110 mmol/L Final   CO2 12/01/2022 27  20 - 32 mmol/L Final   Calcium 12/01/2022 9.7  8.6 - 10.4 mg/dL Final   Total Protein 12/01/2022 6.7  6.1 - 8.1 g/dL Final   Albumin 12/01/2022 4.3  3.6 - 5.1 g/dL Final   Globulin 12/01/2022 2.4  1.9 - 3.7 g/dL (calc) Final   AG Ratio 12/01/2022 1.8  1.0 - 2.5 (calc) Final   Total Bilirubin 12/01/2022 0.4  0.2 - 1.2 mg/dL Final   Alkaline phosphatase (APISO) 12/01/2022 85   37 - 153 U/L Final   AST 12/01/2022 29  10 - 35 U/L Final   ALT 12/01/2022 27  6 - 29 U/L Final   Cholesterol 12/01/2022 152  <200 mg/dL Final   HDL 12/01/2022 91  > OR = 50 mg/dL Final   Triglycerides 12/01/2022 57  <150 mg/dL Final   LDL Cholesterol (Calc) 12/01/2022 48  mg/dL (calc) Final   Comment: Reference range: <100 . Desirable range <100 mg/dL for primary prevention;   <70 mg/dL for patients with CHD or diabetic patients  with > or = 2 CHD risk factors. .Marland KitchenLDL-C is now calculated using the Martin-Hopkins  calculation, which is a validated novel method providing  better accuracy than the Friedewald equation in the  estimation of LDL-C.  MCresenciano Genreet al. JAnnamaria Helling [id number]: 2061-2068  (http://education.[employer].com/faq/FAQ164)    Total CHOL/HDL Ratio 12/01/2022 1.7  <5.0 (calc) Final   Non-HDL Cholesterol (Calc) 12/01/2022 61  <130 mg/dL (calc) Final   Comment: For patients with diabetes plus 1 major ASCVD risk  factor, treating to a non-HDL-C goal of <100 mg/dL  (LDL-C of <70 mg/dL) is considered a therapeutic  option.    TSH 12/01/2022 1.67  0.40 - 4.50 mIU/L Final     Immunization History  Administered Date(s) Administered   Influenza,inj,Quad PF,6+ Mos 07/20/2014, 09/03/2015, 10/23/2019   Influenza-Unspecified 10/08/2013, 08/16/2016, 08/07/2017, 08/07/2018, 10/15/2022   PFIZER Comirnaty(Gray Top)Covid-19 Tri-Sucrose Vaccine 12/24/2019, 01/14/2020, 06/30/2020   Pneumococcal Conjugate-13 09/04/2016   Pneumococcal Polysaccharide-23 09/05/2012, 10/21/2018   RSV,unspecified 10/15/2022   Zoster Recombinat (Shingrix) 06/19/2017, 10/26/2017    Past Medical History:  Diagnosis Date   ADHD (attention deficit hyperactivity disorder) 09/07/2011   Anesthesia complication [id number]  Will need GETA for future endoscopy procedures. Did not tolerate MAC well.   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   Diverticulosis    [place]   Hypertension    on no medications   Hypothyroid 09/07/2011   Internal hemorrhoids    Iron deficiency anemia    Personal history of radiation therapy 2012   Pneumonia    Smoker 02/17/2013   Tubular adenoma of  colon    Past Surgical History:  Procedure Laterality Date   ABDOMINAL HYSTERECTOMY     BIOPSY  12/04/2019   Procedure: BIOPSY;  Surgeon: Juanita Craver, MD;  Location: [facility];  Service: Endoscopy;;   BIOPSY  06/14/2021   Procedure: BIOPSY;  Surgeon: Irving Copas., MD;  Location: [facility];  Service: Gastroenterology;;   BREAST BIOPSY Left 2013   BREAST LUMPECTOMY  12/2011   Left breast   COLONOSCOPY     COLONOSCOPY WITH PROPOFOL N/A 12/04/2019   Procedure: COLONOSCOPY WITH PROPOFOL;  Surgeon: Juanita Craver, MD;  [facility]: [facility];  Service: Endoscopy;  Laterality: N/A;   COLONOSCOPY WITH PROPOFOL N/A 09/20/2020   Procedure: COLONOSCOPY WITH PROPOFOL;  Surgeon: Rush Landmark Telford Nab., MD;  Location: [place];  Service: Gastroenterology;  Laterality: N/A;   COLONOSCOPY WITH PROPOFOL N/A 06/14/2021   Procedure: COLONOSCOPY WITH PROPOFOL;  Surgeon: Rush Landmark Telford Nab., MD;  [facility];  Service: Gastroenterology;  Laterality: N/A;   ENDOSCOPIC MUCOSAL RESECTION N/A 09/20/2020   Procedure: ENDOSCOPIC MUCOSAL RESECTION;  Surgeon: Rush Landmark Telford Nab., MD;  Location: [place];  Service: Gastroenterology;  Laterality: N/A;   ESOPHAGOGASTRODUODENOSCOPY (EGD) WITH PROPOFOL N/A 12/04/2019   Procedure: ESOPHAGOGASTRODUODENOSCOPY (EGD) WITH PROPOFOL;  Surgeon: Juanita Craver, MD;  [facility];  Service: Endoscopy;  Laterality: N/A;   ESOPHAGOGASTRODUODENOSCOPY (EGD) WITH PROPOFOL N/A 06/01/2022   Procedure: ESOPHAGOGASTRODUODENOSCOPY (EGD) WITH PROPOFOL;  Surgeon:  Rush Landmark Telford Nab., MD;  [facility];  Service: Gastroenterology;  Laterality: N/A;   EYE SURGERY Right 06/2013   Lasix - Laser   GIVENS CAPSULE STUDY N/A 06/01/2022   Procedure: GIVENS CAPSULE STUDY;  Surgeon: Irving Copas., MD;  [facility]: [facility];  Service: Gastroenterology;  Laterality: N/A;   HEMOSTASIS CLIP PLACEMENT  09/20/2020   Procedure: HEMOSTASIS CLIP PLACEMENT;  Surgeon: Irving Copas., MD;  Location: [place];  Service: Gastroenterology;;   HOT HEMOSTASIS N/A 06/01/2022   Procedure: HOT HEMOSTASIS (ARGON PLASMA COAGULATION/BICAP);  Surgeon: Irving Copas., MD;  [facility];  Service: Gastroenterology;  Laterality: N/A;   POLYPECTOMY  12/04/2019   Procedure: POLYPECTOMY;  Surgeon: Juanita Craver, MD;  [facility];  Service: Endoscopy;;   POLYPECTOMY  09/20/2020   Procedure: POLYPECTOMY;  Surgeon: Irving Copas., MD;  Location: [place];  Service: Gastroenterology;;   POLYPECTOMY  06/14/2021   Procedure: POLYPECTOMY;  Surgeon: Irving Copas., MD;  Location[facility];  Service: Gastroenterology;;   SUBMUCOSAL LIFTING INJECTION  09/20/2020   Procedure: SUBMUCOSAL LIFTING INJECTION;  Surgeon: Irving Copas., MD;  Location: [place];  Service: Gastroenterology;;   UPPER GASTROINTESTINAL ENDOSCOPY     Current Outpatient Medications on File Prior to Visit  Medication Sig Dispense Refill   acetaminophen (TYLENOL) 650 MG CR tablet Take 650 mg by mouth every 8 (eight) hours as needed for pain.     amphetamine-dextroamphetamine (ADDERALL) 10 MG tablet Take 1 tablet (10 mg total) by mouth 2 (two) times daily. 180 tablet 0   B Complex-C (B-COMPLEX WITH VITAMIN C) tablet Take 1 tablet by mouth daily.     bisacodyl (DULCOLAX) 5 MG EC tablet Take 5 mg by mouth in the morning.     cholecalciferol (VITAMIN D) 1000 UNITS tablet Take 1,000 Units by mouth daily.     esomeprazole (NEXIUM) 40  MG capsule TAKE 1 CAPSULE DAILY 90 capsule 3   ferrous sulfate 325 (65 FE) MG EC tablet Take 325 mg by mouth 3 (three) times daily with meals.  levothyroxine (SYNTHROID) 75 MCG tablet TAKE 1 TABLET DAILY BEFORE BREAKFAST 90 tablet 3   Multiple Vitamin (MULTIVITAMIN WITH MINERALS) TABS tablet Take 1 tablet by mouth daily. Centrum Silver     naproxen (NAPROSYN) 375 MG tablet TAKE 1 TABLET TWICE A DAY WITH MEALS (Patient taking differently: Take 375 mg by mouth daily.) 180 tablet 3   polycarbophil (FIBERCON) 625 MG tablet Take 625 mg by mouth daily.     rosuvastatin (CRESTOR) 20 MG tablet TAKE 1 TABLET DAILY (NEED APPOINTMENT) 90 tablet 3   No current facility-administered medications on file prior to visit.   Allergies  Allergen Reactions   Penicillins Hives    Did it involve swelling of the face/tongue/throat, SOB, or low BP? No Did it involve sudden or severe rash/hives, skin peeling, or any reaction on the inside of your mouth or nose? No Did you need to seek medical attention at a hospital or doctor's office? Yes When did it last happen?      35 years If all above answers are "NO", may proceed with cephalosporin use.   Social History   Socioeconomic History   Marital status: Divorced    Spouse name: Not on file   Number of children: Not on file   Years of education: Not on file   Highest education level: Not on file  Occupational History   Not on file  Tobacco Use   Smoking status: Every Day    Packs/day: 0.50    Years: 40.00    Total pack years: 20.00    Types: E-cigarettes, Cigarettes   Smokeless tobacco: Never  Vaping Use   Vaping Use: Never used  Substance and Sexual Activity   Alcohol use: Yes    Alcohol/week: 0.0 standard drinks of alcohol    Comment: 4 mixed drinks per week    Drug use: No   Sexual activity: Never  Other Topics Concern   Not on file  Social History Narrative   Not on file   Social Determinants of Health   Financial Resource Strain: Low  Risk  (11/14/2022)   Overall Financial Resource Strain (CARDIA)    Difficulty of Paying Living Expenses: Not hard at all  Food Insecurity: No Food Insecurity (11/14/2022)   Hunger Vital Sign    Worried About Running Out of Food in the Last Year: Never true    Ran Out of Food in the Last Year: Never true  Transportation Needs: No Transportation Needs (11/14/2022)   PRAPARE - Hydrologist (Medical): No    Lack of Transportation (Non-Medical): No  Physical Activity: Sufficiently Active (11/14/2022)   Exercise Vital Sign    Days of Exercise per Week: 7 days    Minutes of Exercise per Session: 30 min  Stress: No Stress Concern Present (11/14/2022)   [place]    Feeling of Stress : Not at all  Social Connections: Moderately Integrated (11/14/2022)   Social Connection and Isolation Panel [NHANES]    Frequency of Communication with Friends and Family: Once a week    Frequency of Social Gatherings with Friends and Family: Three times a week    Attends Religious Services: 1 to 4 times per year    Active Member of Clubs or Organizations: Yes    Attends Archivist Meetings: More than 4 times per year    Marital Status: Divorced  Intimate Partner Violence: Not At Risk (11/14/2022)   Humiliation, Afraid, Rape,  and Kick questionnaire    Fear of Current or Ex-Partner: No    Emotionally Abused: No    Physically Abused: No    Sexually Abused: No   Family History  Problem Relation Age of Onset   Stomach cancer Father    Esophageal cancer Father    Diabetes Maternal Grandmother    Crohn's disease Daughter    Ulcerative colitis Daughter    Colon cancer Neg Hx    Pancreatic cancer Neg Hx    Liver disease Neg Hx    Inflammatory bowel disease Neg Hx    Rectal cancer Neg Hx       Review of Systems  All other systems reviewed and are negative.      Objective:   Physical Exam Vitals  reviewed.  Constitutional:      General: She is not in acute distress.    Appearance: She is well-developed. She is not diaphoretic.  HENT:     Head: Normocephalic and atraumatic.     Right Ear: External ear normal.     Left Ear: External ear normal.     Nose: Nose normal.     Mouth/Throat:     Pharynx: No oropharyngeal exudate.  Eyes:     General: No scleral icterus.       Right eye: No discharge.        Left eye: No discharge.     Conjunctiva/sclera: Conjunctivae normal.     Pupils: Pupils are equal, round, and reactive to light.  Neck:     Thyroid: No thyromegaly.     Vascular: No JVD.     Trachea: No tracheal deviation.  Cardiovascular:     Rate and Rhythm: Normal rate and regular rhythm.     Heart sounds: Normal heart sounds. No murmur heard.    No friction rub. No gallop.  Pulmonary:     Effort: Pulmonary effort is normal. No respiratory distress.     Breath sounds: Normal breath sounds. No stridor. No wheezing or rales.  Chest:     Chest wall: No tenderness.  Abdominal:     General: Bowel sounds are normal. There is no distension.     Palpations: Abdomen is soft. There is no mass.     Tenderness: There is no abdominal tenderness. There is no guarding or rebound.  Musculoskeletal:        General: No tenderness. Normal range of motion.     Cervical back: Normal range of motion and neck supple.  Lymphadenopathy:     Cervical: No cervical adenopathy.  Skin:    General: Skin is warm.     Coloration: Skin is not pale.     Findings: No erythema or rash.  Neurological:     Mental Status: She is alert and oriented to person, place, and time.     Cranial Nerves: No cranial nerve deficit.     Motor: No abnormal muscle tone.     Coordination: Coordination normal.     Deep Tendon Reflexes: Reflexes are normal and symmetric.  Psychiatric:        Behavior: Behavior normal.        Thought Content: Thought content normal.        Judgment: Judgment normal.            Assessment & Plan:  Encounter for Medicare annual wellness exam  Other iron deficiency anemia  Smoker  ASCVD (arteriosclerotic cardiovascular disease)  Hypothyroidism, unspecified type  Attention deficit hyperactivity disorder (ADHD), unspecified ADHD  type With regards to her ongoing problems, I have recommended checking a CBC and an iron level every 3 months.  She is currently off iron.  As long as her hemoglobin remained stable I do not feel that she needs to keep taking the iron.  If her hemoglobin begins to drop slowly I would resume the iron.  However if there is heavy bleeding, I would recommend a referral to [facility] for a double-balloon endoscopy.  Recommended smoking cessation.  Patient is due for a CT scan of her lungs in September to monitor pulmonary nodule.  She will call me when she is ready to schedule this.  I recommended a mammogram and she is already scheduled this for this afternoon.  Colonoscopy is up-to-date.  Lab work is outstanding.  Recommended Prevnar 20 next year and a COVID booster.  The remainder of her preventative care is up-to-date.  She is scheduled with bone density test and it is waiting to be performed

## 2022-12-15 ENCOUNTER — Other Ambulatory Visit (INDEPENDENT_AMBULATORY_CARE_PROVIDER_SITE_OTHER): Payer: Self-pay | Admitting: Family Medicine

## 2022-12-15 NOTE — Addendum Note (Signed)
Addended by: Jenna Luo T on: 12/15/2022 04:20 PM   Modules accepted: Level of Service

## 2022-12-22 ENCOUNTER — Other Ambulatory Visit: Payer: Self-pay | Admitting: Family Medicine

## 2022-12-22 NOTE — Telephone Encounter (Signed)
Requested Prescriptions  Pending Prescriptions Disp Refills   naproxen (NAPROSYN) 375 MG tablet [Pharmacy Med Name: NAPROXEN TABS 375MG ] 180 tablet 3    Sig: TAKE 1 TABLET TWICE A DAY WITH MEALS     Analgesics:  NSAIDS Failed - 12/22/2022  3:19 AM      Failed - Manual Review: Labs are only required if the patient has taken medication for more than 8 weeks.      Failed - PLT in normal range and within 360 days    Platelets  Date Value Ref Range Status  12/01/2022 514 (H) 140 - 400 Thousand/uL Final  08/04/2014 455 (H) 145 - 400 10e3/uL Final   Platelet Count  Date Value Ref Range Status  11/25/2021 485 (H) 150 - 400 K/uL Final         Failed - Valid encounter within last 12 months    Recent Outpatient Visits           1 year ago Encounter for screening mammogram for malignant neoplasm of breast   [place] [facility], Cammie Mcgee, MD   2 years ago Annual physical exam   [facility] [name], MD   2 years ago Other iron deficiency anemia   [place] [place], FNP   3 years ago Other iron deficiency anemia   [place] [facility], Cammie Mcgee, MD   4 years ago Postmenopausal estrogen deficiency   [place] [facility], Cammie Mcgee, MD              Passed - Cr in normal range and within 360 days    Creatinine  Date Value Ref Range Status  08/04/2014 0.8 0.6 - 1.1 mg/dL Final   Creat  Date Value Ref Range Status  12/01/2022 0.60 0.50 - 1.05 mg/dL Final         Passed - HGB in normal range and within 360 days    Hemoglobin  Date Value Ref Range Status  12/01/2022 13.3 11.7 - 15.5 g/dL Final  11/25/2021 11.1 (L) 12.0 - 15.0 g/dL Final   HGB  Date Value Ref Range Status  08/04/2014 14.2 11.6 - 15.9 g/dL Final         Passed - HCT in normal range and within 360 days    HCT  Date Value Ref Range Status  12/01/2022 39.3 35.0 - 45.0 % Final  08/04/2014 42.8 34.8 - 46.6 % Final          Passed - eGFR is 30 or above and within 360 days    GFR, Est African American  Date Value Ref Range Status  10/26/2020 106 > OR = 60 mL/min/1.82m2 Final   GFR, Est Non African American  Date Value Ref Range Status  10/26/2020 92 > OR = 60 mL/min/1.70m2 Final   eGFR  Date Value Ref Range Status  12/01/2022 99 > OR = 60 mL/min/1.50m2 Final         Passed - Patient is not pregnant

## 2023-01-24 ENCOUNTER — Other Ambulatory Visit: Payer: Self-pay | Admitting: Family Medicine

## 2023-02-27 ENCOUNTER — Other Ambulatory Visit: Payer: Medicare Other

## 2023-02-27 DIAGNOSIS — D508 Other iron deficiency anemias: Secondary | ICD-10-CM

## 2023-02-27 DIAGNOSIS — R195 Other fecal abnormalities: Secondary | ICD-10-CM

## 2023-02-28 ENCOUNTER — Other Ambulatory Visit: Payer: Self-pay | Admitting: Family Medicine

## 2023-02-28 LAB — CBC WITH DIFFERENTIAL/PLATELET
Absolute Monocytes: 629 cells/uL (ref 200–950)
Basophils Absolute: 59 cells/uL (ref 0–200)
Basophils Relative: 0.8 %
Eosinophils Absolute: 474 cells/uL (ref 15–500)
Eosinophils Relative: 6.4 %
HCT: 38.2 % (ref 35.0–45.0)
Hemoglobin: 12.4 g/dL (ref 11.7–15.5)
Lymphs Abs: 1258 cells/uL (ref 850–3900)
MCH: 31.6 pg (ref 27.0–33.0)
MCHC: 32.5 g/dL (ref 32.0–36.0)
MCV: 97.4 fL (ref 80.0–100.0)
MPV: 10.3 fL (ref 7.5–12.5)
Monocytes Relative: 8.5 %
Neutro Abs: 4980 cells/uL (ref 1500–7800)
Neutrophils Relative %: 67.3 %
Platelets: 480 10*3/uL — ABNORMAL HIGH (ref 140–400)
RBC: 3.92 10*6/uL (ref 3.80–5.10)
RDW: 12.1 % (ref 11.0–15.0)
Total Lymphocyte: 17 %
WBC: 7.4 10*3/uL (ref 3.8–10.8)

## 2023-02-28 LAB — IRON,TIBC AND FERRITIN PANEL
%SAT: 31 % (calc) (ref 16–45)
Ferritin: 13 ng/mL — ABNORMAL LOW (ref 16–288)
Iron: 130 ug/dL (ref 45–160)
TIBC: 413 mcg/dL (calc) (ref 250–450)

## 2023-03-07 ENCOUNTER — Other Ambulatory Visit: Payer: Self-pay | Admitting: Family Medicine

## 2023-03-07 DIAGNOSIS — R195 Other fecal abnormalities: Secondary | ICD-10-CM

## 2023-03-07 LAB — FECAL GLOBIN BY IMMUNOCHEMISTRY
FECAL GLOBIN RESULT:: DETECTED — AB
MICRO NUMBER:: 15032932
SPECIMEN QUALITY:: ADEQUATE

## 2023-03-07 LAB — HOUSE ACCOUNT TRACKING

## 2023-03-15 ENCOUNTER — Encounter: Payer: Self-pay | Admitting: Family Medicine

## 2023-03-16 ENCOUNTER — Other Ambulatory Visit: Payer: Self-pay | Admitting: Family Medicine

## 2023-03-16 ENCOUNTER — Other Ambulatory Visit: Payer: Self-pay | Admitting: *Deleted

## 2023-03-16 DIAGNOSIS — I6523 Occlusion and stenosis of bilateral carotid arteries: Secondary | ICD-10-CM

## 2023-03-16 MED ORDER — AMPHETAMINE-DEXTROAMPHETAMINE 10 MG PO TABS: 10.0000 mg | ORAL_TABLET | Freq: Two times a day (BID) | ORAL | 0 refills | Status: DC

## 2023-03-19 ENCOUNTER — Encounter: Payer: Self-pay | Admitting: Family Medicine

## 2023-03-20 ENCOUNTER — Telehealth: Payer: Self-pay | Admitting: Gastroenterology

## 2023-03-20 NOTE — Telephone Encounter (Signed)
PT recently had lab work and stool sample done and she is wanting Dr. Meridee Score to review them. Please advise.

## 2023-03-20 NOTE — Telephone Encounter (Signed)
Dr Meridee Score please review the May labs and stool test per pt request.

## 2023-03-21 NOTE — Telephone Encounter (Signed)
Patty, Please let patient know I have reviewed labs. Thankfully no evidence of anemia but her iron levels do remain slightly low. I suspect this is most likely a result of her AVMs, and so she has not restarted oral iron she should.  There is very little downside to this. In regards to the FIT test that was performed, I would not normally have recommended this since this is really a colon cancer screening test, and she had already had her colonoscopy in a reasonable time frame.  I think the likelihood of a new colon cancer is low, but now we are stuck with this test result.  As such the recommendation would be for her to consider undergoing repeat colonoscopy to ensure nothing is being missed.  This would be my recommendation.  Could also consider push enteroscopy to try to reach any of the AVMs when we do colonoscopy.  In the end, as has previously been discussed, she likely may need a referral to [facility] for device assisted balloon enteroscopy if we have concerns of recurring/persisting/progressive iron deficiency to reach the AVMs noted on last capsule. If she feels like she wants to talk about this in clinic then set her up with me to discuss things further next available.  Thanks. GM

## 2023-03-21 NOTE — Telephone Encounter (Signed)
The pt has been advised of the recommendations. She will start iron daily. She prefers to have follow up with GM to discuss next steps.  Appt made for next available 9/24. She will call to see if we have any cancellations.

## 2023-03-26 ENCOUNTER — Ambulatory Visit (HOSPITAL_COMMUNITY)
Admission: RE | Admit: 2023-03-26 | Discharge: 2023-03-26 | Disposition: A | Discharge status: Home / Self Care | Payer: Medicare Other | Source: Ambulatory Visit | Attending: Surgery | Admitting: Surgery

## 2023-03-26 ENCOUNTER — Ambulatory Visit (INDEPENDENT_AMBULATORY_CARE_PROVIDER_SITE_OTHER): Payer: Medicare Other | Admitting: Physician Assistant

## 2023-03-26 VITALS — BP 145/83 | HR 75 | Temp 97.8°F | Resp 14 | Ht 65.0 in | Wt 123.8 lb

## 2023-03-26 DIAGNOSIS — I6523 Occlusion and stenosis of bilateral carotid arteries: Secondary | ICD-10-CM | POA: Insufficient documentation

## 2023-03-26 NOTE — Progress Notes (Signed)
Office Note   History of Present Illness   [name] is a 67 y.o. (01-09-1956) female who presents for surveillance of carotid artery stenosis.  She is being followed for carotid artery stenosis in the setting of fibromuscular dysplasia.  She has a history of syncopal episode in 2009 with a workup inconclusive for stroke or TIA.  The patient returns today for follow up. She denies any recent CVA or TIA diagnosis. She also denies any recent strokelike symptoms such as slurred speech, facial droop, sudden visual changes, or sudden weakness/numbness.  She does not take a baby aspirin due to previous GI bleeding issues.  Current Outpatient Medications  Medication Sig Dispense Refill   acetaminophen (TYLENOL) 650 MG CR tablet Take 650 mg by mouth every 8 (eight) hours as needed for pain.     amphetamine-dextroamphetamine (ADDERALL) 10 MG tablet Take 1 tablet (10 mg total) by mouth 2 (two) times daily. 180 tablet 0   B Complex-C (B-COMPLEX WITH VITAMIN C) tablet Take 1 tablet by mouth daily.     bisacodyl (DULCOLAX) 5 MG EC tablet Take 5 mg by mouth in the morning.     cholecalciferol (VITAMIN D) 1000 UNITS tablet Take 1,000 Units by mouth daily.     esomeprazole (NEXIUM) 40 MG capsule TAKE 1 CAPSULE DAILY 90 capsule 3   ferrous sulfate 325 (65 FE) MG EC tablet Take 325 mg by mouth 1 day or 1 dose.     levothyroxine (SYNTHROID) 75 MCG tablet TAKE 1 TABLET DAILY BEFORE BREAKFAST 90 tablet 1   Multiple Vitamin (MULTIVITAMIN WITH MINERALS) TABS tablet Take 1 tablet by mouth daily. Centrum Silver     naproxen (NAPROSYN) 375 MG tablet TAKE 1 TABLET TWICE A DAY WITH MEALS 180 tablet 3   polycarbophil (FIBERCON) 625 MG tablet Take 625 mg by mouth daily.     rosuvastatin (CRESTOR) 20 MG tablet TAKE 1 TABLET DAILY (NEED APPOINTMENT) 90 tablet 3   No current facility-administered medications for this visit.    REVIEW OF SYSTEMS (negative unless checked):   Cardiac:  []  Chest pain or chest  pressure? []  Shortness of breath upon activity? []  Shortness of breath when lying flat? []  Irregular heart rhythm?  Vascular:  []  Pain in calf, thigh, or hip brought on by walking? []  Pain in feet at night that wakes you up from your sleep? []  Blood clot in your veins? []  Leg swelling?  Pulmonary:  []  Oxygen at home? []  Productive cough? []  Wheezing?  Neurologic:  []  Sudden weakness in arms or legs? []  Sudden numbness in arms or legs? []  Sudden onset of difficult speaking or slurred speech? []  Temporary loss of vision in one eye? []  Problems with dizziness?  Gastrointestinal:  []  Blood in stool? []  Vomited blood?  Genitourinary:  []  Burning when urinating? []  Blood in urine?  Psychiatric:  []  Major depression  Hematologic:  []  Bleeding problems? []  Problems with blood clotting?  Dermatologic:  []  Rashes or ulcers?  Constitutional:  []  Fever or chills?  Ear/Nose/Throat:  []  Change in hearing? []  Nose bleeds? []  Sore throat?  Musculoskeletal:  []  Back pain? []  Joint pain? []  Muscle pain?   Physical Examination   Vitals:   03/26/23 1249  BP: (!) 145/83  Pulse: 75  Resp: 14  Temp: 97.8 F (36.6 C)  TempSrc: Temporal  SpO2: 95%  Weight: 123 lb 12.8 oz (56.2 kg)  Height: 5\' 5"  (1.651 m)   Body mass index is 20.6 kg/m.  General:  WDWN in NAD; vital signs documented above Gait: Not observed HENT: WNL, normocephalic Pulmonary: normal non-labored breathing , without rales, rhonchi,  wheezing Cardiac: regular Abdomen: soft, NT, no masses Skin: without rashes Vascular Exam/Pulses: palpable radial pulses bilaterally Extremities: without ischemic changes, without gangrene , without cellulitis; without open wounds;  Musculoskeletal: no muscle wasting or atrophy  Neurologic: A&O X 3;  No focal weakness or paresthesias are detected Psychiatric:  The pt has Normal affect.  Non-Invasive Vascular Imaging   Bilateral Carotid Duplex (03/26/2023):  R  ICA stenosis:  40-59% R VA:  patent and antegrade L ICA stenosis:  40-59% L VA:  patent and antegrade   Medical Decision Making   [name] is a 67 y.o. female who presents for surveillance of carotid artery stenosis  Based on the patient's vascular studies, her carotid artery stenosis is unchanged at 40 to 59% She denies any strokelike symptoms such as slurred speech, facial droop, sudden visual changes, or sudden weakness/numbness. No recent diagnosis of CVA or TIA. She cannot take aspirin due to previous issues with GI bleeding.  She can follow-up with our office in 1 year with repeat carotid duplex  Jean Dubonnet PA-C [facility] Office: [phone]  Clinic MD: [name]

## 2023-04-02 ENCOUNTER — Other Ambulatory Visit: Payer: Self-pay

## 2023-04-02 DIAGNOSIS — I6523 Occlusion and stenosis of bilateral carotid arteries: Secondary | ICD-10-CM

## 2023-04-29 ENCOUNTER — Other Ambulatory Visit: Payer: Self-pay | Admitting: Family Medicine

## 2023-05-03 ENCOUNTER — Other Ambulatory Visit: Payer: Self-pay

## 2023-05-03 ENCOUNTER — Telehealth: Payer: Self-pay | Admitting: Family Medicine

## 2023-05-03 DIAGNOSIS — I2510 Atherosclerotic heart disease of native coronary artery without angina pectoris: Secondary | ICD-10-CM

## 2023-05-03 MED ORDER — ROSUVASTATIN CALCIUM 20 MG PO TABS
20.0000 mg | ORAL_TABLET | Freq: Every day | ORAL | 1 refills | Status: DC
Start: 2023-05-03 — End: 2023-10-25

## 2023-05-03 NOTE — Telephone Encounter (Signed)
Prescription Request  05/03/2023  LOV: 12/12/2022 (CPE)  What is the name of the medication or equipment?   rosuvastatin (CRESTOR) 20 MG tablet  **patient will be out of meds in 2 days**  Have you contacted your pharmacy to request a refill? Yes   Which pharmacy would you like this sent to?  [id number] [place], [place] - [address] [place] [address] Phone: [phone] Fax: [phone]    Patient notified that their request is being sent to the clinical staff for review and that they should receive a response within 2 business days.   Please advise patient at [phone].

## 2023-05-09 ENCOUNTER — Other Ambulatory Visit: Payer: Medicare Other

## 2023-06-06 ENCOUNTER — Ambulatory Visit (INDEPENDENT_AMBULATORY_CARE_PROVIDER_SITE_OTHER): Payer: Medicare Other | Admitting: Gastroenterology

## 2023-06-06 ENCOUNTER — Encounter: Payer: Self-pay | Admitting: Gastroenterology

## 2023-06-06 ENCOUNTER — Other Ambulatory Visit (INDEPENDENT_AMBULATORY_CARE_PROVIDER_SITE_OTHER): Payer: Medicare Other

## 2023-06-06 VITALS — BP 118/78 | HR 76 | Ht 62.6 in | Wt 125.0 lb

## 2023-06-06 DIAGNOSIS — Z862 Personal history of diseases of the blood and blood-forming organs and certain disorders involving the immune mechanism: Secondary | ICD-10-CM

## 2023-06-06 DIAGNOSIS — K5520 Angiodysplasia of colon without hemorrhage: Secondary | ICD-10-CM | POA: Diagnosis not present

## 2023-06-06 DIAGNOSIS — Z86010 Personal history of colonic polyps: Secondary | ICD-10-CM | POA: Diagnosis not present

## 2023-06-06 DIAGNOSIS — K5521 Angiodysplasia of colon with hemorrhage: Secondary | ICD-10-CM

## 2023-06-06 LAB — IBC + FERRITIN
Ferritin: 14.6 ng/mL (ref 10.0–291.0)
Iron: 228 ug/dL — ABNORMAL HIGH (ref 42–145)
Saturation Ratios: 49.1 % (ref 20.0–50.0)
TIBC: 464.8 ug/dL — ABNORMAL HIGH (ref 250.0–450.0)
Transferrin: 332 mg/dL (ref 212.0–360.0)

## 2023-06-06 LAB — CBC
HCT: 39.6 % (ref 36.0–46.0)
Hemoglobin: 13.3 g/dL (ref 12.0–15.0)
MCHC: 33.6 g/dL (ref 30.0–36.0)
MCV: 97.2 fl (ref 78.0–100.0)
Platelets: 477 10*3/uL — ABNORMAL HIGH (ref 150.0–400.0)
RBC: 4.07 Mil/uL (ref 3.87–5.11)
RDW: 13.5 % (ref 11.5–15.5)
WBC: 7.3 10*3/uL (ref 4.0–10.5)

## 2023-06-06 MED ORDER — PLENVU 140 G PO SOLR: 1.0000 | ORAL | 0 refills | Status: DC

## 2023-06-06 NOTE — Progress Notes (Signed)
GASTROENTEROLOGY OUTPATIENT CLINIC VISIT   Primary Care Provider Donita Brooks, MD [address] [place] [address] [phone]  Patient Profile: [name] is a 67 y.o. female with a pmh significant for ADHD, CAD, Breast DCIS, prior VTE, hypothyroidism, prior IDA, hemorrhoids, colon polyps (TA's status post resections), AVMs small bowel (noted on VCE).  The patient presents to the [facility] for an evaluation and management of problem(s) noted below:  Problem List 1. History of iron deficiency anemia   2. AVM (arteriovenous malformation) of small bowel, acquired   3. Hx of adenomatous colonic polyps     History of Present Illness Please see prior notes for full details of HPI.  Interval History The patient presents for follow-up.  She has been doing well overall.  She did have a fit test performed recently and this returned positive.  She has not noted overt blood in her stools or changes in her bowel habits.  Her last set of laboratories earlier this year showed improvement in her blood counts and only a slight iron insufficiency.  She is not feeling any more fatigued and not having any more issues otherwise.  She is due for her colon polyp surveillance and she is ready to undergo any further endoscopic evaluation that may be required.  GI Review of Systems Positive as above Negative for nausea, vomiting, odynophagia, alteration of bowel habits, melena, hematochezia  Review of Systems General: Denies fevers/chills/weight loss unintentionally Cardiovascular: Denies chest pain Pulmonary: Denies shortness of breath Gastroenterological: See HPI Genitourinary: Denies darkened urine or hematuria Hematological: Denies easy bruising/bleeding Dermatological: Denies jaundice Psychological: Mood is stable   Medications Current Outpatient Medications  Medication Sig Dispense Refill   acetaminophen (TYLENOL) 650 MG CR tablet Take 650 mg by  mouth every 8 (eight) hours as needed for pain.     amphetamine-dextroamphetamine (ADDERALL) 10 MG tablet Take 1 tablet (10 mg total) by mouth 2 (two) times daily. 180 tablet 0   B Complex-C (B-COMPLEX WITH VITAMIN C) tablet Take 1 tablet by mouth daily.     bisacodyl (DULCOLAX) 5 MG EC tablet Take 5 mg by mouth in the morning.     cholecalciferol (VITAMIN D) 1000 UNITS tablet Take 1,000 Units by mouth daily.     esomeprazole (NEXIUM) 40 MG capsule TAKE 1 CAPSULE DAILY 90 capsule 3   ferrous sulfate 325 (65 FE) MG EC tablet Take 325 mg by mouth 1 day or 1 dose.     levothyroxine (SYNTHROID) 75 MCG tablet TAKE 1 TABLET DAILY BEFORE BREAKFAST 90 tablet 1   Multiple Vitamin (MULTIVITAMIN WITH MINERALS) TABS tablet Take 1 tablet by mouth daily. Centrum Silver     naproxen (NAPROSYN) 375 MG tablet TAKE 1 TABLET TWICE A DAY WITH MEALS 180 tablet 3   PEG-KCl-NaCl-NaSulf-Na Asc-C (PLENVU) 140 g SOLR Take 1 kit by mouth as directed. Use coupon: BIN: [id number] PNC: CNRX Group: [id number] ID: [id number] 1 each 0   polycarbophil (FIBERCON) 625 MG tablet Take 625 mg by mouth daily.     rosuvastatin (CRESTOR) 20 MG tablet Take 1 tablet (20 mg total) by mouth daily. 90 tablet 1   No current facility-administered medications for this visit.    Allergies Allergies  Allergen Reactions   Penicillins Hives    Did it involve swelling of the face/tongue/throat, SOB, or low BP? No Did it involve sudden or severe rash/hives, skin peeling, or any reaction on the inside of your mouth or nose? No  Did you need to seek medical attention at a hospital or doctor's office? Yes When did it last happen?      35 years If all above answers are "NO", may proceed with cephalosporin use.    Histories Past Medical History:  Diagnosis Date   ADHD (attention deficit hyperactivity disorder) 09/07/2011   Anesthesia complication 09/20/2020   Will need GETA for future endoscopy procedures. Did not tolerate MAC well.   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   Diverticulosis    DVT (deep venous thrombosis) (HCC) 09/07/2011   Hypertension    on no medications   Hypothyroid 09/07/2011   Internal hemorrhoids    Iron deficiency anemia    Personal history of radiation therapy 2012   Pneumonia    Smoker 02/17/2013   Tubular adenoma of colon    Past Surgical History:  Procedure Laterality Date   ABDOMINAL HYSTERECTOMY     BIOPSY  12/04/2019   Procedure: BIOPSY;  Surgeon: Charna Elizabeth, MD;  Location: [facility];  Service: Endoscopy;;   BIOPSY  06/14/2021   Procedure: BIOPSY;  Surgeon: Lemar Lofty., MD;  Location: [facility];  Service: Gastroenterology;;   BREAST BIOPSY Left 2013   BREAST LUMPECTOMY  12/2011   Left breast   COLONOSCOPY     COLONOSCOPY WITH PROPOFOL N/A 12/04/2019   Procedure: COLONOSCOPY WITH PROPOFOL;  Surgeon: Charna Elizabeth, MD;  Location: [facility];  Service: Endoscopy;  Laterality: N/A;   COLONOSCOPY WITH PROPOFOL N/A 09/20/2020   Procedure: COLONOSCOPY WITH PROPOFOL;  Surgeon: Meridee Score Netty Starring., MD;  Location: [facility] ENDOSCOPY;  Service: Gastroenterology;  Laterality: N/A;   COLONOSCOPY WITH PROPOFOL N/A 06/14/2021   Procedure: COLONOSCOPY WITH PROPOFOL;  Surgeon: Meridee Score Netty Starring., MD;  Location: [facility];  Service: Gastroenterology;  Laterality: N/A;   ENDOSCOPIC MUCOSAL RESECTION N/A 09/20/2020   Procedure: ENDOSCOPIC MUCOSAL RESECTION;  Surgeon: Meridee Score Netty Starring., MD;  Location: [facility] ENDOSCOPY;  Service: Gastroenterology;  Laterality: N/A;   ESOPHAGOGASTRODUODENOSCOPY (EGD) WITH PROPOFOL N/A 12/04/2019   Procedure: ESOPHAGOGASTRODUODENOSCOPY (EGD) WITH PROPOFOL;  Surgeon: Charna Elizabeth, MD;  Location: [facility];  Service: Endoscopy;  Laterality: N/A;   ESOPHAGOGASTRODUODENOSCOPY (EGD) WITH PROPOFOL  N/A 06/01/2022   Procedure: ESOPHAGOGASTRODUODENOSCOPY (EGD) WITH PROPOFOL;  Surgeon: Meridee Score Netty Starring., MD;  Location: [facility];  Service: Gastroenterology;  Laterality: N/A;   EYE SURGERY Right 06/2013   Lasix - Laser   GIVENS CAPSULE STUDY N/A 06/01/2022   Procedure: GIVENS CAPSULE STUDY;  Surgeon: Lemar Lofty., MD;  Location: [facility];  Service: Gastroenterology;  Laterality: N/A;   HEMOSTASIS CLIP PLACEMENT  09/20/2020   Procedure: HEMOSTASIS CLIP PLACEMENT;  Surgeon: Lemar Lofty., MD;  Location: [facility] ENDOSCOPY;  Service: Gastroenterology;;   HOT HEMOSTASIS N/A 06/01/2022   Procedure: HOT HEMOSTASIS (ARGON PLASMA COAGULATION/BICAP);  Surgeon: Lemar Lofty., MD;  Location: [facility]COPY;  Service: Gastroenterology;  Laterality: N/A;   POLYPECTOMY  12/04/2019   Procedure: POLYPECTOMY;  Surgeon: Charna Elizabeth, MD;  Location: [facility];  Service: Endoscopy;;   POLYPECTOMY  09/20/2020   Procedure: POLYPECTOMY;  Surgeon: Lemar Lofty., MD;  Location: [facility] ENDOSCOPY;  Service: Gastroenterology;;   POLYPECTOMY  06/14/2021   Procedure: POLYPECTOMY;  Surgeon: Lemar Lofty., MD;  Location: [facility] ENDOSCOPY;  Service: Gastroenterology;;   SUBMUCOSAL LIFTING INJECTION  09/20/2020  Procedure: SUBMUCOSAL LIFTING INJECTION;  Surgeon: Meridee Score Netty Starring., MD;  Location: [facility] ENDOSCOPY;  Service: Gastroenterology;;   UPPER GASTROINTESTINAL ENDOSCOPY     Social History   Socioeconomic History   Marital status: Divorced    Spouse name: Not on file   Number of children: Not on file   Years of education: Not on file   Highest education level: Not on file  Occupational History   Not on file  Tobacco Use   Smoking status: Every Day    Current packs/day: 0.50    Average packs/day: 0.5 packs/day for 40.0 years (20.0 ttl pk-yrs)    Types: Cigarettes   Smokeless tobacco: Never  Vaping Use   Vaping status: Never Used  Substance and Sexual  Activity   Alcohol use: Yes    Alcohol/week: 0.0 standard drinks of alcohol    Comment: 4 mixed drinks per week    Drug use: No   Sexual activity: Never  Other Topics Concern   Not on file  Social History Narrative   Not on file   Social Determinants of Health   Financial Resource Strain: Low Risk  (11/14/2022)   Overall Financial Resource Strain (CARDIA)    Difficulty of Paying Living Expenses: Not hard at all  Food Insecurity: No Food Insecurity (11/14/2022)   Hunger Vital Sign    Worried About Running Out of Food in the Last Year: Never true    Ran Out of Food in the Last Year: Never true  Transportation Needs: No Transportation Needs (11/14/2022)   PRAPARE - Administrator, Civil Service (Medical): No    Lack of Transportation (Non-Medical): No  Physical Activity: Sufficiently Active (11/14/2022)   Exercise Vital Sign    Days of Exercise per Week: 7 days    Minutes of Exercise per Session: 30 min  Stress: No Stress Concern Present (11/14/2022)   [employer] of Occupational Health - Occupational Stress Questionnaire    Feeling of Stress : Not at all  Social Connections: Moderately Integrated (11/14/2022)   Social Connection and Isolation Panel [NHANES]    Frequency of Communication with Friends and Family: Once a week    Frequency of Social Gatherings with Friends and Family: Three times a week    Attends Religious Services: 1 to 4 times per year    Active Member of Clubs or Organizations: Yes    Attends Banker Meetings: More than 4 times per year    Marital Status: Divorced  Intimate Partner Violence: Not At Risk (11/14/2022)   Humiliation, Afraid, Rape, and Kick questionnaire    Fear of Current or Ex-Partner: No    Emotionally Abused: No    Physically Abused: No    Sexually Abused: No   Family History  Problem Relation Age of Onset   Stomach cancer Father    Esophageal cancer Father    Diabetes Maternal Grandmother    Crohn's disease  Daughter    Ulcerative colitis Daughter    Colon cancer Neg Hx    Pancreatic cancer Neg Hx    Liver disease Neg Hx    Inflammatory bowel disease Neg Hx    Rectal cancer Neg Hx    I have reviewed her medical, social, and family history in detail and updated the electronic medical record as necessary.    PHYSICAL EXAMINATION  BP 118/78   Pulse 76   Ht 5' 2.6" (1.59 m)   Wt 125 lb (56.7 kg)   SpO2 98%  BMI 22.43 kg/m  Wt Readings from Last 3 Encounters:  06/06/23 125 lb (56.7 kg)  03/26/23 123 lb 12.8 oz (56.2 kg)  12/12/22 123 lb 6.4 oz (56 kg)  GEN: NAD, appears stated age, doesn't appear chronically ill PSYCH: Cooperative, without pressured speech EYE: Conjunctivae pink, sclerae anicteric ENT: MMM CV: Nontachycardic RESP: No audible wheezing GI: NABS, soft, NT/ND, without rebound or guarding MSK/EXT: No lower extremity edema SKIN: No jaundice NEURO:  Alert & Oriented x 3, no focal deficits   REVIEW OF DATA  I reviewed the following data at the time of this encounter:  GI Procedures and Studies  August 2023 VCE Endoscopically placed capsule study. Finding of multiple AVMs, nonbleeding. The most proximal AVM is 32 minutes distal to the first duodenal image. The most distal is 2 hours and 50 minutes beyond the first duodenal image. (34 minutes, 37 minutes, 42 minutes, 1 hour 25 minutes, 2 hours 52 minutes total time).  Laboratory Studies  Reviewed those in epic  Imaging Studies  No new imaging studies to review   ASSESSMENT  Ms. Plantz is a 67 y.o. female with a pmh significant for ADHD, CAD, Breast DCIS, prior VTE, hypothyroidism, prior IDA, hemorrhoids, colon polyps (TA's status post resections), AVMs small bowel (noted on VCE).  The patient is seen today for evaluation and management of:  1. History of iron deficiency anemia   2. AVM (arteriovenous malformation) of small bowel, acquired   3. Hx of adenomatous colonic polyps    The patient is clinically and  hemodynamically stable.  The patient is doing well otherwise.  She did have a positive fit and although I expect that this is more likely a result of her chronic AVMs and she is not overtly anemic, does make sense for [place] to go ahead and get her up-to-date for her colon polyp surveillance and also pursue an upper enteroscopy to see if we can reach some of the AVMs that were noted on the video capsule endoscopy.  We will set this up in the hospital.  Will see how her iron indices and blood counts look at this point since it has been a few months out.  The risks and benefits of endoscopic evaluation were discussed with the patient; these include but are not limited to the risk of perforation, infection, bleeding, missed lesions, lack of diagnosis, severe illness requiring hospitalization, as well as anesthesia and sedation related illnesses.  The patient and/or family is agreeable to proceed.  All patient questions were answered to the best of my ability, and the patient agrees to the aforementioned plan of action with follow-up as indicated.   PLAN  Proceed with scheduling enteroscopy Proceed with scheduling colonoscopy for colon polyp surveillance after previous piecemeal EMR Laboratories as outlined below Hopefully she will not need a double-balloon enteroscopy at [facility] but time will tell   Orders Placed This Encounter  Procedures   Procedural/ Surgical Case Request: COLONOSCOPY WITH PROPOFOL, ENDOSCOPIC MUCOSAL RESECTION, ENTEROSCOPY   CBC   IBC + Ferritin   Ambulatory referral to Gastroenterology    New Prescriptions   PEG-KCL-NACL-NASULF-NA ASC-C (PLENVU) 140 G SOLR    Take 1 kit by mouth as directed. Use coupon: BIN: [id number] PNC: CNRX Group: [id number] ID: [id number]   Modified Medications   No medications on file    Planned Follow Up No follow-ups on file.   Total Time in Face-to-Face and in Coordination of Care for patient including independent/personal interpretation/review of  prior testing, medical history,  examination, medication adjustment, communicating results with the patient directly, and documentation within the EHR is 25 minutes.   Corliss Parish, MD [facility] Office # [phone]

## 2023-06-06 NOTE — Patient Instructions (Signed)
Your provider has requested that you go to the basement level for lab work before leaving today. Press "B" on the elevator. The lab is located at the first door on the left as you exit the elevator.  We have sent the following medications to your pharmacy for you to pick up at your convenience: Plenvu  You have been scheduled for an endoscopy and colonoscopy. Please follow the written instructions given to you at your visit today.  Please pick up your prep supplies at the pharmacy within the next 1-3 days.  If you use inhalers (even only as needed), please bring them with you on the day of your procedure.  DO NOT TAKE 7 DAYS PRIOR TO TEST- Trulicity (dulaglutide) Ozempic, Wegovy (semaglutide) Mounjaro (tirzepatide) Bydureon Bcise (exanatide extended release)  DO NOT TAKE 1 DAY PRIOR TO YOUR TEST Rybelsus (semaglutide) Adlyxin (lixisenatide) Victoza (liraglutide) Byetta (exanatide) ___________________________________________________________________________  Due to recent changes in healthcare laws, you may see the results of your imaging and laboratory studies on MyChart before your provider has had a chance to review them.  We understand that in some cases there may be results that are confusing or concerning to you. Not all laboratory results come back in the same time frame and the provider may be waiting for multiple results in order to interpret others.  Please give [place] 48 hours in order for your provider to thoroughly review all the results before contacting the office for clarification of your results.    Thank you for choosing me and [facility].  Dr. Meridee Score

## 2023-06-10 ENCOUNTER — Encounter: Payer: Self-pay | Admitting: Gastroenterology

## 2023-06-10 DIAGNOSIS — Z862 Personal history of diseases of the blood and blood-forming organs and certain disorders involving the immune mechanism: Secondary | ICD-10-CM | POA: Insufficient documentation

## 2023-06-13 ENCOUNTER — Encounter: Payer: Self-pay | Admitting: Family Medicine

## 2023-06-14 ENCOUNTER — Other Ambulatory Visit: Payer: Self-pay | Admitting: Family Medicine

## 2023-06-14 MED ORDER — AMPHETAMINE-DEXTROAMPHETAMINE 10 MG PO TABS: 10.0000 mg | ORAL_TABLET | Freq: Two times a day (BID) | ORAL | 0 refills | Status: DC

## 2023-07-04 ENCOUNTER — Ambulatory Visit (HOSPITAL_COMMUNITY)
Admission: RE | Admit: 2023-07-04 | Discharge: 2023-07-04 | Disposition: A | Discharge status: Home / Self Care | Payer: Medicare Other | Source: Ambulatory Visit | Attending: Adult Health | Admitting: Adult Health

## 2023-07-04 DIAGNOSIS — I2510 Atherosclerotic heart disease of native coronary artery without angina pectoris: Secondary | ICD-10-CM | POA: Diagnosis not present

## 2023-07-04 DIAGNOSIS — F17200 Nicotine dependence, unspecified, uncomplicated: Secondary | ICD-10-CM | POA: Insufficient documentation

## 2023-07-04 DIAGNOSIS — I700 Atherosclerosis of aorta: Secondary | ICD-10-CM | POA: Diagnosis not present

## 2023-07-04 DIAGNOSIS — F17210 Nicotine dependence, cigarettes, uncomplicated: Secondary | ICD-10-CM | POA: Diagnosis not present

## 2023-07-04 DIAGNOSIS — D3502 Benign neoplasm of left adrenal gland: Secondary | ICD-10-CM | POA: Insufficient documentation

## 2023-07-04 DIAGNOSIS — J439 Emphysema, unspecified: Secondary | ICD-10-CM | POA: Diagnosis not present

## 2023-07-04 DIAGNOSIS — Z122 Encounter for screening for malignant neoplasm of respiratory organs: Secondary | ICD-10-CM | POA: Insufficient documentation

## 2023-07-11 ENCOUNTER — Encounter: Payer: Self-pay | Admitting: Adult Health

## 2023-07-20 ENCOUNTER — Telehealth: Payer: Self-pay | Admitting: *Deleted

## 2023-07-20 NOTE — Telephone Encounter (Signed)
RN placed call to pt with below information.  Pt appreciative of call and verbalized understanding.

## 2023-07-20 NOTE — Telephone Encounter (Signed)
-----   Message from Noreene Filbert sent at 07/20/2023  1:33 PM EDT ----- CT chest neg.  Recommend continued annual lung cancer screening, and f/u ----- Message ----- From: Interface, Rad Results In Sent: 07/20/2023  11:18 AM EDT To: Loa Socks, NP

## 2023-07-23 ENCOUNTER — Other Ambulatory Visit: Payer: Self-pay | Admitting: Family Medicine

## 2023-07-24 NOTE — Telephone Encounter (Signed)
Requested Prescriptions  Pending Prescriptions Disp Refills   levothyroxine (SYNTHROID) 75 MCG tablet [Pharmacy Med Name: L-THYROXINE TABS 75MCG] 90 tablet 0    Sig: TAKE 1 TABLET DAILY BEFORE BREAKFAST     Endocrinology:  Hypothyroid Agents Failed - 07/23/2023  3:12 AM      Failed - Valid encounter within last 12 months    Recent Outpatient Visits           1 year ago Encounter for screening mammogram for malignant neoplasm of breast   [facility] Pickard, Priscille Heidelberg, MD   2 years ago Annual physical exam   [facility] Donita Brooks, MD   3 years ago Other iron deficiency anemia   [facility] Elmore Guise, FNP   3 years ago Other iron deficiency anemia   [facility] Pickard, Priscille Heidelberg, MD   4 years ago Postmenopausal estrogen deficiency   [facility] Pickard, Priscille Heidelberg, MD              Passed - TSH in normal range and within 360 days    TSH  Date Value Ref Range Status  12/01/2022 1.67 0.40 - 4.50 mIU/L Final

## 2023-08-01 ENCOUNTER — Encounter (HOSPITAL_COMMUNITY): Payer: Self-pay | Admitting: Gastroenterology

## 2023-08-07 ENCOUNTER — Telehealth: Payer: Self-pay | Admitting: Gastroenterology

## 2023-08-07 NOTE — Telephone Encounter (Signed)
Patient called and stated she is experiencing a fever of 101 since this morning. She has a procedure scheduled for the 7th of November

## 2023-08-08 NOTE — Telephone Encounter (Signed)
Returned call to patient. She is sick running a fever, neg for Covid, but has had fever x2 days with URI symptoms.  Patient has been r/s to 11/15/23. New instructions will be sent to patient.

## 2023-08-08 NOTE — Telephone Encounter (Signed)
Patient calling to follow up on message below, please advise

## 2023-08-24 ENCOUNTER — Encounter: Payer: Self-pay | Admitting: Gastroenterology

## 2023-08-27 ENCOUNTER — Other Ambulatory Visit: Payer: Self-pay

## 2023-08-27 MED ORDER — ESOMEPRAZOLE MAGNESIUM 40 MG PO CPDR: 40.0000 mg | DELAYED_RELEASE_CAPSULE | Freq: Every day | ORAL | 3 refills | Status: DC

## 2023-09-10 ENCOUNTER — Encounter: Payer: Self-pay | Admitting: Family Medicine

## 2023-09-11 ENCOUNTER — Other Ambulatory Visit: Payer: Self-pay | Admitting: Family Medicine

## 2023-09-11 MED ORDER — AMPHETAMINE-DEXTROAMPHETAMINE 10 MG PO TABS: 10.0000 mg | ORAL_TABLET | Freq: Two times a day (BID) | ORAL | 0 refills | Status: DC

## 2023-09-12 ENCOUNTER — Telehealth: Payer: Self-pay | Admitting: Gastroenterology

## 2023-09-12 NOTE — Telephone Encounter (Signed)
Inbound call from patient requesting a call to discuss prep medication for 2/13 colonoscopy. Also requesting to discuss rescheduling for a late time. Please advise, thank you.

## 2023-09-13 ENCOUNTER — Other Ambulatory Visit: Payer: Self-pay | Admitting: Family Medicine

## 2023-09-13 MED ORDER — AMPHETAMINE-DEXTROAMPHETAMINE 10 MG PO TABS: 10.0000 mg | ORAL_TABLET | Freq: Two times a day (BID) | ORAL | 0 refills | Status: DC

## 2023-09-13 NOTE — Telephone Encounter (Signed)
Returned call to patient. She would like to move her procedure to 11/26/23. Patient has been r/s to 11/26/23 procedure time 11:30 am, arrival time 10:00 am. Updated instructions sent to patient via my chart. If there are any cancellation for Jan 2025, patient would like to be up sooner.

## 2023-10-10 ENCOUNTER — Other Ambulatory Visit: Payer: Self-pay | Admitting: Family Medicine

## 2023-10-25 ENCOUNTER — Other Ambulatory Visit: Payer: Self-pay | Admitting: Family Medicine

## 2023-10-25 DIAGNOSIS — I2510 Atherosclerotic heart disease of native coronary artery without angina pectoris: Secondary | ICD-10-CM

## 2023-11-08 ENCOUNTER — Ambulatory Visit
Admission: RE | Admit: 2023-11-08 | Discharge: 2023-11-08 | Disposition: A | Discharge status: Home / Self Care | Payer: Medicare Other | Source: Ambulatory Visit | Attending: Adult Health | Admitting: Adult Health

## 2023-11-08 DIAGNOSIS — M8589 Other specified disorders of bone density and structure, multiple sites: Secondary | ICD-10-CM

## 2023-11-15 ENCOUNTER — Telehealth: Payer: Self-pay

## 2023-11-15 NOTE — Telephone Encounter (Addendum)
Called pt per Np message below. Pt verbalized understanding.----- Message from Noreene Filbert sent at 11/15/2023  8:37 AM EST ----- Bone densiyt shows osteopenia.  Please let her know I will discuss further at her next appt in one week ----- Message ----- From: Interface, Rad Results In Sent: 11/13/2023   1:17 PM EST To: Loa Socks, NP

## 2023-11-20 ENCOUNTER — Encounter (HOSPITAL_COMMUNITY): Payer: Self-pay | Admitting: Gastroenterology

## 2023-11-20 ENCOUNTER — Telehealth: Payer: Self-pay | Admitting: Gastroenterology

## 2023-11-20 NOTE — Telephone Encounter (Signed)
Procedure: Colonoscopy Procedure date: 11/26/23 Procedure location: [facility] Arrival Time 10:00 Spoke with the patient Y/N: Y Any prep concerns? No, [place]  Has the patient obtained the prep from the pharmacy ? Y Do you have a care partner and transportation: Y Any additional concerns?

## 2023-11-21 ENCOUNTER — Telehealth: Payer: Self-pay | Admitting: Adult Health

## 2023-11-21 NOTE — Telephone Encounter (Signed)
Rescheduled appointment per provider due to weather. Patient is aware of the changes made to her upcoming appointment.

## 2023-11-22 ENCOUNTER — Inpatient Hospital Stay: Payer: Medicare Other | Admitting: Adult Health

## 2023-11-24 NOTE — Anesthesia Preprocedure Evaluation (Signed)
 Anesthesia Evaluation  Patient identified by MRN, date of birth, ID band Patient awake    Reviewed: Allergy & Precautions, NPO status , Patient's Chart, lab work & pertinent test results  Airway Mallampati: III  TM Distance: >3 FB Neck ROM: Full    Dental  (+) Teeth Intact, Dental Advisory Given   Pulmonary Current Smoker and Patient abstained from smoking. <1ppd   Pulmonary exam normal breath sounds clear to auscultation       Cardiovascular hypertension (183/90 preop, per pt normally 160 SBP), + DVT  Normal cardiovascular exam Rhythm:Regular Rate:Normal     Neuro/Psych negative neurological ROS  negative psych ROS   GI/Hepatic Neg liver ROS,GERD  Medicated and Controlled,,  Endo/Other  Hypothyroidism    Renal/GU negative Renal ROS  negative genitourinary   Musculoskeletal  (+) Arthritis , Osteoarthritis,    Abdominal   Peds  Hematology negative hematology ROS (+)   Anesthesia Other Findings   Reproductive/Obstetrics negative OB ROS                             Anesthesia Physical Anesthesia Plan  ASA: 3  Anesthesia Plan: MAC   Post-op Pain Management:    Induction:   PONV Risk Score and Plan: 2 and Propofol infusion and TIVA  Airway Management Planned: Natural Airway and Simple Face Mask  Additional Equipment: None  Intra-op Plan:   Post-operative Plan:   Informed Consent: I have reviewed the patients History and Physical, chart, labs and discussed the procedure including the risks, benefits and alternatives for the proposed anesthesia with the patient or authorized representative who has indicated his/her understanding and acceptance.       Plan Discussed with: CRNA  Anesthesia Plan Comments: (09/2020 procedure w/ mansouraty: "Pt intermittently restless with increased movement during case despite additional propofol and fentanyl administration. Pt frequently  repositioned by endo staff/anesthesia to facilitate endoscopic procedure and to keep pressure points free from pressure. Dr. Desmond Lope aware. Mansouraty able to proceed with case. Recommend general anesthetic for future endoscopic procedures after discussion with Turk/Mansouraty."  Has had MAC since then without issue (2023) w/ addition of precedex)        Anesthesia Quick Evaluation

## 2023-11-26 ENCOUNTER — Ambulatory Visit (HOSPITAL_COMMUNITY): Payer: Medicare Other | Admitting: Physician Assistant

## 2023-11-26 ENCOUNTER — Other Ambulatory Visit: Payer: Self-pay

## 2023-11-26 ENCOUNTER — Encounter (HOSPITAL_COMMUNITY): Payer: Self-pay | Admitting: Gastroenterology

## 2023-11-26 ENCOUNTER — Encounter (HOSPITAL_COMMUNITY)
Admission: RE | Disposition: A | Discharge status: Home / Self Care | Payer: Self-pay | Source: Home / Self Care | Attending: Gastroenterology

## 2023-11-26 ENCOUNTER — Ambulatory Visit (HOSPITAL_COMMUNITY)
Admission: RE | Admit: 2023-11-26 | Discharge: 2023-11-26 | Disposition: A | Discharge status: Home / Self Care | Payer: Medicare Other | Attending: Gastroenterology | Admitting: Gastroenterology

## 2023-11-26 ENCOUNTER — Telehealth: Payer: Self-pay

## 2023-11-26 DIAGNOSIS — D127 Benign neoplasm of rectosigmoid junction: Secondary | ICD-10-CM

## 2023-11-26 DIAGNOSIS — K2289 Other specified disease of esophagus: Secondary | ICD-10-CM

## 2023-11-26 DIAGNOSIS — Z862 Personal history of diseases of the blood and blood-forming organs and certain disorders involving the immune mechanism: Secondary | ICD-10-CM

## 2023-11-26 DIAGNOSIS — D125 Benign neoplasm of sigmoid colon: Secondary | ICD-10-CM | POA: Diagnosis not present

## 2023-11-26 DIAGNOSIS — K642 Third degree hemorrhoids: Secondary | ICD-10-CM | POA: Diagnosis not present

## 2023-11-26 DIAGNOSIS — D509 Iron deficiency anemia, unspecified: Secondary | ICD-10-CM | POA: Diagnosis not present

## 2023-11-26 DIAGNOSIS — I10 Essential (primary) hypertension: Secondary | ICD-10-CM

## 2023-11-26 DIAGNOSIS — D122 Benign neoplasm of ascending colon: Secondary | ICD-10-CM | POA: Diagnosis not present

## 2023-11-26 DIAGNOSIS — Z1211 Encounter for screening for malignant neoplasm of colon: Secondary | ICD-10-CM | POA: Diagnosis not present

## 2023-11-26 DIAGNOSIS — D123 Benign neoplasm of transverse colon: Secondary | ICD-10-CM

## 2023-11-26 DIAGNOSIS — Z860101 Personal history of adenomatous and serrated colon polyps: Secondary | ICD-10-CM

## 2023-11-26 DIAGNOSIS — K5520 Angiodysplasia of colon without hemorrhage: Secondary | ICD-10-CM | POA: Diagnosis not present

## 2023-11-26 DIAGNOSIS — K31819 Angiodysplasia of stomach and duodenum without bleeding: Secondary | ICD-10-CM

## 2023-11-26 DIAGNOSIS — K449 Diaphragmatic hernia without obstruction or gangrene: Secondary | ICD-10-CM | POA: Insufficient documentation

## 2023-11-26 DIAGNOSIS — Z9889 Other specified postprocedural states: Secondary | ICD-10-CM | POA: Insufficient documentation

## 2023-11-26 DIAGNOSIS — K5730 Diverticulosis of large intestine without perforation or abscess without bleeding: Secondary | ICD-10-CM | POA: Diagnosis not present

## 2023-11-26 HISTORY — PX: POLYPECTOMY: SHX5525

## 2023-11-26 HISTORY — PX: HOT HEMOSTASIS: SHX5433

## 2023-11-26 HISTORY — PX: COLONOSCOPY WITH PROPOFOL: SHX5780

## 2023-11-26 HISTORY — PX: SUBMUCOSAL TATTOO INJECTION: SHX6856

## 2023-11-26 HISTORY — PX: ENDOSCOPIC MUCOSAL RESECTION: SHX6839

## 2023-11-26 HISTORY — PX: SUBMUCOSAL LIFTING INJECTION: SHX6855

## 2023-11-26 HISTORY — PX: ENTEROSCOPY: SHX5533

## 2023-11-26 SURGERY — COLONOSCOPY WITH PROPOFOL
Anesthesia: Monitor Anesthesia Care

## 2023-11-26 MED ORDER — PROPOFOL 500 MG/50ML IV EMUL
INTRAVENOUS | Status: DC | PRN
  Administered 2023-11-26: 180 ug/kg/min via INTRAVENOUS

## 2023-11-26 MED ORDER — PROPOFOL 10 MG/ML IV BOLUS
INTRAVENOUS | Status: DC | PRN
  Administered 2023-11-26 (×3): 20 mg via INTRAVENOUS

## 2023-11-26 MED ORDER — DEXMEDETOMIDINE HCL IN NACL 80 MCG/20ML IV SOLN
INTRAVENOUS | Status: DC | PRN
  Administered 2023-11-26 (×2): 8 ug via INTRAVENOUS
  Administered 2023-11-26: 4 ug via INTRAVENOUS

## 2023-11-26 MED ORDER — LIDOCAINE 2% (20 MG/ML) 5 ML SYRINGE
INTRAMUSCULAR | Status: DC | PRN
  Administered 2023-11-26: 40 mg via INTRAVENOUS

## 2023-11-26 MED ORDER — SPOT INK MARKER SYRINGE KIT
PACK | SUBMUCOSAL | Status: DC | PRN
  Administered 2023-11-26: 2 mL via SUBMUCOSAL

## 2023-11-26 MED ORDER — PROPOFOL 500 MG/50ML IV EMUL
INTRAVENOUS | Status: AC
  Filled 2023-11-26: qty 50

## 2023-11-26 MED ORDER — SODIUM CHLORIDE 0.9 % IV SOLN
INTRAVENOUS | Status: DC
Start: 2023-11-26 — End: 2023-11-26

## 2023-11-26 MED ORDER — PROPOFOL 1000 MG/100ML IV EMUL
INTRAVENOUS | Status: AC
  Filled 2023-11-26: qty 100

## 2023-11-26 SURGICAL SUPPLY — 20 items
ELECT REM PT RETURN 9FT ADLT (ELECTROSURGICAL)
ELECTRODE REM PT RTRN 9FT ADLT (ELECTROSURGICAL) IMPLANT
FLOOR PAD 36X40 (MISCELLANEOUS) ×2
FORCEPS BIOP RAD 4 LRG CAP 4 (CUTTING FORCEPS) IMPLANT
FORCEPS BIOP RJ4 240 W/NDL (CUTTING FORCEPS)
FORCEPS BXJMBJMB 240X2.8X (CUTTING FORCEPS) IMPLANT
INJECTOR/SNARE I SNARE (MISCELLANEOUS) IMPLANT
LUBRICANT JELLY 4.5OZ STERILE (MISCELLANEOUS) IMPLANT
MANIFOLD NEPTUNE II (INSTRUMENTS) IMPLANT
NDL SCLEROTHERAPY 25GX240 (NEEDLE) IMPLANT
NEEDLE SCLEROTHERAPY 25GX240 (NEEDLE) IMPLANT
PAD FLOOR 36X40 (MISCELLANEOUS) ×2 IMPLANT
PROBE APC STR FIRE (PROBE) IMPLANT
PROBE INJECTION GOLD 7FR (MISCELLANEOUS) IMPLANT
SNARE ROTATE MED OVAL 20MM (MISCELLANEOUS) IMPLANT
SYR 50ML LL SCALE MARK (SYRINGE) IMPLANT
TRAP SPECIMEN MUCOUS 40CC (MISCELLANEOUS) IMPLANT
TUBING ENDO SMARTCAP PENTAX (MISCELLANEOUS) IMPLANT
TUBING IRRIGATION ENDOGATOR (MISCELLANEOUS) ×2 IMPLANT
WATER STERILE IRR 1000ML POUR (IV SOLUTION) IMPLANT

## 2023-11-26 NOTE — Anesthesia Postprocedure Evaluation (Signed)
 Anesthesia Post Note  Patient: AKARI DEFELICE  Procedure(s) Performed: COLONOSCOPY WITH PROPOFOL ENDOSCOPIC MUCOSAL RESECTION ENTEROSCOPY SUBMUCOSAL TATTOO INJECTION HOT HEMOSTASIS (ARGON PLASMA COAGULATION/BICAP) SUBMUCOSAL LIFTING INJECTION POLYPECTOMY     Patient location during evaluation: PACU Anesthesia Type: MAC Level of consciousness: awake and alert Pain management: pain level controlled Vital Signs Assessment: post-procedure vital signs reviewed and stable Respiratory status: spontaneous breathing, nonlabored ventilation and respiratory function stable Cardiovascular status: blood pressure returned to baseline and stable Postop Assessment: no apparent nausea or vomiting Anesthetic complications: no   No notable events documented.  Last Vitals:  Vitals:   11/26/23 1340 11/26/23 1345  BP: 129/75   Pulse: 73 72  Resp: 16 13  Temp:    SpO2: 99% 100%    Last Pain:  Vitals:   11/26/23 1340  TempSrc:   PainSc: 0-No pain                 Lannie Fields

## 2023-11-26 NOTE — Telephone Encounter (Signed)
 Lab order has been entered as ordered

## 2023-11-26 NOTE — Discharge Instructions (Signed)

## 2023-11-26 NOTE — Transfer of Care (Signed)
 Immediate Anesthesia Transfer of Care Note  Patient: [name]  Procedure(s) Performed: COLONOSCOPY WITH PROPOFOL ENDOSCOPIC MUCOSAL RESECTION ENTEROSCOPY SUBMUCOSAL TATTOO INJECTION HOT HEMOSTASIS (ARGON PLASMA COAGULATION/BICAP) SUBMUCOSAL LIFTING INJECTION POLYPECTOMY  Patient Location: PACU and Endoscopy Unit  Anesthesia Type:MAC  Level of Consciousness: sedated  Airway & Oxygen Therapy: Patient Spontanous Breathing and Patient connected to face mask oxygen  Post-op Assessment: Report given to RN and Post -op Vital signs reviewed and stable  Post vital signs: Reviewed and stable  Last Vitals:  Vitals Value Taken Time  BP 113/68 11/26/23 1325  Temp    Pulse 69 11/26/23 1327  Resp 11 11/26/23 1327  SpO2 100 % 11/26/23 1327  Vitals shown include unfiled device data.  Last Pain:  Vitals:   11/26/23 1027  TempSrc: Tympanic  PainSc: 0-No pain         Complications: No notable events documented.

## 2023-11-26 NOTE — Op Note (Signed)
 [facility] Patient Name: [name] Procedure Date: 11/26/2023 MRN: [id number] Attending MD: Corliss Parish , MD, [id number] Date of Birth: 1956/04/21 CSN: [id number] Age: 68 Admit Type: Outpatient Procedure:                Small bowel enteroscopy Indications:              Iron deficiency anemia, Arteriovenous malformation                            in the small intestine Providers:                Corliss Parish, MD, Stephens Shire RN, RN, Alan Ripper, Technician Referring MD:              Medicines:                Monitored Anesthesia Care Complications:            No immediate complications. Estimated Blood Loss:     Estimated blood loss was minimal. Procedure:                Pre-Anesthesia Assessment:                           - Prior to the procedure, a History and Physical                            was performed, and patient medications and                            allergies were reviewed. The patient's tolerance of                            previous anesthesia was also reviewed. The risks                            and benefits of the procedure and the sedation                            options and risks were discussed with the patient.                            All questions were answered, and informed consent                            was obtained. Prior Anticoagulants: The patient has                            taken no anticoagulant or antiplatelet agents                            except for NSAID medication. ASA Grade Assessment:  III - A patient with severe systemic disease. After                            reviewing the risks and benefits, the patient was                            deemed in satisfactory condition to undergo the                            procedure.                           After obtaining informed consent, the endoscope was                            passed under  direct vision. Throughout the                            procedure, the patient's blood pressure, pulse, and                            oxygen saturations were monitored continuously. The                            [id number]-HQ190L ([id number]) Olympus colonoscope was                            introduced through the mouth and advanced to the                            proximal jejunum. The small bowel enteroscopy was                            accomplished without difficulty. The patient                            tolerated the procedure. Scope In: Scope Out: Findings:      No gross lesions were noted in the entire esophagus.      The Z-line was irregular and was found 37 cm from the incisors.      A 2 cm hiatal hernia was present.      No gross lesions were noted in the entire examined stomach.      Normal mucosa was found in the duodenal bulb, in the first portion of       the duodenum, in the second portion of the duodenum, in the third       portion of the duodenum and in the fourth portion of the duodenum.      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 Fulguration to ablate the lesion to prevent bleeding       by argon plasma (right colon settings) was successful.      Normal mucosa was found in the rest of the visualized proximal jejunum.       Area was tattooed with an injection of Spot (carbon black) to  demarcate       the distal extent of today's push enteroscopy. Impression:               - No gross lesions in the entire esophagus. Z-line                            irregular, 37 cm from the incisors.                           - 2 cm hiatal hernia.                           - No gross lesions in the entire stomach.                           - Normal mucosa was found in the duodenal bulb, in                            the first portion of the duodenum, in the second                            portion of the duodenum, in the third portion  of                            the duodenum and in the fourth portion of the                            duodenum.                           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 Treated with argon plasma                            coagulation (APC).                           - Normal mucosa was found in the rest of the                            visualized proximal jejunum. Tattooed distal extent                            of today's procedure. Recommendation:           - Proceed to scheduled colonoscopy.                           - Continue present medications.                           - If issues of IDA persist, will need to consider  repeat VCE to see if distal AVM is still present,                            and if so then SB-SBE v DB-SBE will need to be                            considered to reach those areas.                           - The findings and recommendations were discussed                            with the patient.                           - The findings and recommendations were discussed                            with the patient's family. Procedure Code(s):        --- Professional ---                           [phone], Small intestinal endoscopy, enteroscopy                            beyond second portion of duodenum, not including                            ileum; with control of bleeding (eg, injection,                            bipolar cautery, unipolar cautery, laser, heater                            probe, stapler, plasma coagulator)                           44799, Unlisted procedure, small intestine Diagnosis Code(s):        --- Professional ---                           K22.89, Other specified disease of esophagus                           K44.9, Diaphragmatic hernia without obstruction or                            gangrene                           K31.819,  Angiodysplasia of stomach and duodenum                            without bleeding  D50.9, Iron deficiency anemia, unspecified CPT copyright 2022 American Medical Association. All rights reserved. The codes documented in this report are preliminary and upon coder review may  be revised to meet current compliance requirements. Corliss Parish, MD 11/26/2023 1:27:03 PM Number of Addenda: 0

## 2023-11-26 NOTE — H&P (Signed)
 GASTROENTEROLOGY PROCEDURE H&P NOTE   Primary Care Physician: Donita Brooks, MD  HPI: [name] is a 68 y.o. female who presents for SBE/Colonoscopy for evaluation of IDA, hx of AVMs small bowel, and surveillance of previous colon polyps including a large AC polyps s/p prior resections last in 2022.  Past Medical History:  Diagnosis Date   ADHD (attention deficit hyperactivity disorder) 09/07/2011   Anesthesia complication 09/20/2020   Will need GETA for future endoscopy procedures. Did not tolerate MAC well.   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   Diverticulosis    DVT (deep venous thrombosis) (HCC) 09/07/2011   Hypertension    on no medications   Hypothyroid 09/07/2011   Internal hemorrhoids    Iron deficiency anemia    Personal history of radiation therapy 2012   Pneumonia    Smoker 02/17/2013   Tubular adenoma of colon    Past Surgical History:  Procedure Laterality Date   ABDOMINAL HYSTERECTOMY     BIOPSY  12/04/2019   Procedure: BIOPSY;  Surgeon: Charna Elizabeth, MD;  Location: [facility] END[facility];  Service: Endoscopy;;   BIOPSY  06/14/2021   Procedure: BIOPSY;  Surgeon: Lemar Lofty., MD;  Location: [facility];  Service: Gastroenterology;;   BREAST BIOPSY Left 2013   BREAST LUMPECTOMY  12/2011   Left breast   COLONOSCOPY     COLONOSCOPY WITH PROPOFOL N/A 12/04/2019   Procedure: COLONOSCOPY WITH PROPOFOL;  Surgeon: Charna Elizabeth, MD;  Location: [facility];  Service: Endoscopy;  Laterality: N/A;   COLONOSCOPY WITH PROPOFOL N/A 09/20/2020   Procedure: COLONOSCOPY WITH PROPOFOL;  Surgeon: Meridee Score Netty Starring., MD;  Location: [facility] ENDOSCOPY;  Service: Gastroenterology;  Laterality: N/A;   COLONOSCOPY WITH PROPOFOL N/A 06/14/2021   Procedure: COLONOSCOPY WITH PROPOFOL;  Surgeon: Meridee Score  Netty Starring., MD;  Location: [facility];  Service: Gastroenterology;  Laterality: N/A;   ENDOSCOPIC MUCOSAL RESECTION N/A 09/20/2020   Procedure: ENDOSCOPIC MUCOSAL RESECTION;  Surgeon: Meridee Score Netty Starring., MD;  Location: [facility] ENDOSCOPY;  Service: Gastroenterology;  Laterality: N/A;   ESOPHAGOGASTRODUODENOSCOPY (EGD) WITH PROPOFOL N/A 12/04/2019   Procedure: ESOPHAGOGASTRODUODENOSCOPY (EGD) WITH PROPOFOL;  Surgeon: Charna Elizabeth, MD;  Location: [facility];  Service: Endoscopy;  Laterality: N/A;   ESOPHAGOGASTRODUODENOSCOPY (EGD) WITH PROPOFOL N/A 06/01/2022   Procedure: ESOPHAGOGASTRODUODENOSCOPY (EGD) WITH PROPOFOL;  Surgeon: Meridee Score Netty Starring., MD;  Location: [facility];  Service: Gastroenterology;  Laterality: N/A;   EYE SURGERY Right 06/2013   Lasix - Laser   GIVENS CAPSULE STUDY N/A 06/01/2022   Procedure: GIVENS CAPSULE STUDY;  Surgeon: Lemar Lofty., MD;  Location: [facility];  Service: Gastroenterology;  Laterality: N/A;   HEMOSTASIS CLIP PLACEMENT  09/20/2020   Procedure: HEMOSTASIS CLIP PLACEMENT;  Surgeon: Lemar Lofty., MD;  Location: [facility] ENDOSCOPY;  Service: Gastroenterology;;   HOT HEMOSTASIS N/A 06/01/2022   Procedure: HOT HEMOSTASIS (ARGON PLASMA COAGULATION/BICAP);  Surgeon: Lemar Lofty., MD;  Location: [facility] ENDOSCOPY;  Service: Gastroenterology;  Laterality: N/A;   POLYPECTOMY  12/04/2019   Procedure: POLYPECTOMY;  Surgeon: Charna Elizabeth, MD;  Location: [facility];  Service: Endoscopy;;   POLYPECTOMY  09/20/2020   Procedure: POLYPECTOMY;  Surgeon: Lemar Lofty., MD;  Location: [facility] ENDOSCOPY;  Service: Gastroenterology;;   POLYPECTOMY  06/14/2021   Procedure: POLYPECTOMY;  Surgeon: Lemar Lofty., MD;  Location: [facility];  Service: Gastroenterology;;   SUBMUCOSAL LIFTING INJECTION  09/20/2020   Procedure: SUBMUCOSAL LIFTING INJECTION;  Surgeon: Lemar Lofty., MD;  Location: [facility] ENDOSCOPY;  Service:  Gastroenterology;;   UPPER GASTROINTESTINAL ENDOSCOPY     No current facility-administered medications for this encounter.   No current facility-administered medications for this encounter. Allergies  Allergen Reactions   Penicillins Hives    Did it involve swelling of the face/tongue/throat, SOB, or low BP? No Did it involve sudden or severe rash/hives, skin peeling, or any reaction on the inside of your mouth or nose? No Did you need to seek medical attention at a hospital or doctor's office? Yes When did it last happen?      35 years If all above answers are "NO", may proceed with cephalosporin use.   Family History  Problem Relation Age of Onset   Stomach cancer Father    Esophageal cancer Father    Diabetes Maternal Grandmother    Crohn's disease Daughter    Ulcerative colitis Daughter    Colon cancer Neg Hx    Pancreatic cancer Neg Hx    Liver disease Neg Hx    Inflammatory bowel disease Neg Hx    Rectal cancer Neg Hx    Social History   Socioeconomic History   Marital status: Divorced    Spouse name: Not on file   Number of children: Not on file   Years of education: Not on file   Highest education level: Not on file  Occupational History   Not on file  Tobacco Use   Smoking status: Every Day    Current packs/day: 0.50    Average packs/day: 0.5 packs/day for 40.0 years (20.0 ttl pk-yrs)    Types: Cigarettes   Smokeless tobacco: Never  Vaping Use   Vaping status: Never Used  Substance and Sexual Activity   Alcohol use: Yes    Alcohol/week: 0.0 standard drinks of alcohol    Comment: 4 mixed drinks per week    Drug use: No   Sexual activity: Never  Other Topics Concern   Not on file  Social History Narrative   Not on file   Social Drivers of Health   Financial Resource Strain: Low Risk  (11/14/2022)   Overall Financial Resource Strain (CARDIA)    Difficulty of Paying Living Expenses: Not hard at all  Food Insecurity: No Food Insecurity (11/14/2022)    Hunger Vital Sign    Worried About Running Out of Food in the Last Year: Never true    Ran Out of Food in the Last Year: Never true  Transportation Needs: No Transportation Needs (11/14/2022)   PRAPARE - Administrator, Civil Service (Medical): No    Lack of Transportation (Non-Medical): No  Physical Activity: Sufficiently Active (11/14/2022)   Exercise Vital Sign    Days of Exercise per Week: 7 days    Minutes of Exercise per Session: 30 min  Stress: No Stress Concern Present (11/14/2022)   [employer] of Occupational Health - Occupational Stress Questionnaire    Feeling of Stress : Not at all  Social Connections: Moderately Integrated (11/14/2022)   Social Connection and Isolation Panel [NHANES]    Frequency of Communication with Friends and Family: Once a week    Frequency of Social Gatherings with Friends and Family: Three times a week    Attends Religious Services: 1 to 4 times per year    Active Member of Clubs or Organizations: Yes  Attends Banker Meetings: More than 4 times per year    Marital Status: Divorced  Intimate Partner Violence: Not At Risk (11/14/2022)   Humiliation, Afraid, Rape, and Kick questionnaire    Fear of Current or Ex-Partner: No    Emotionally Abused: No    Physically Abused: No    Sexually Abused: No    Physical Exam: There were no vitals filed for this visit. There is no height or weight on file to calculate BMI. GEN: NAD EYE: Sclerae anicteric ENT: MMM CV: Non-tachycardic GI: Soft, NT/ND NEURO:  Alert & Oriented x 3  Lab Results: No results for input(s): "WBC", "HGB", "HCT", "PLT" in the last 72 hours. BMET No results for input(s): "NA", "K", "CL", "CO2", "GLUCOSE", "BUN", "CREATININE", "CALCIUM" in the last 72 hours. LFT No results for input(s): "PROT", "ALBUMIN", "AST", "ALT", "ALKPHOS", "BILITOT", "BILIDIR", "IBILI" in the last 72 hours. PT/INR No results for input(s): "LABPROT", "INR" in the last 72  hours.   Impression / Plan: This is a 68 y.o.female who presents for SBE/Colonoscopy for evaluation of IDA, hx of AVMs small bowel, and surveillance of previous colon polyps including a large AC polyps s/p prior resections last in 2022.  The risks and benefits of endoscopic evaluation/treatment were discussed with the patient and/or family; these include but are not limited to the risk of perforation, infection, bleeding, missed lesions, lack of diagnosis, severe illness requiring hospitalization, as well as anesthesia and sedation related illnesses.  The patient's history has been reviewed, patient examined, no change in status, and deemed stable for procedure.  The patient and/or family is agreeable to proceed.    Corliss Parish, MD [facility] Office # [phone]

## 2023-11-26 NOTE — Telephone Encounter (Signed)
-----   Message from [facility] sent at 11/26/2023  1:43 PM EST ----- Regarding: Follow up labs [name], Repeat CBC/Iron/TIBC/Ferritin in 4-6 weeks. Please place order. Thanks. GM

## 2023-11-26 NOTE — Op Note (Signed)
 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 Estimated Blood Loss:     Estimated blood loss was minimal. Procedure:                Pre-Anesthesia Assessment:                           - Prior to the procedure, a History and Physical                            was performed, and patient medications and                            allergies were reviewed. The patient's tolerance of                            previous anesthesia was also reviewed. The risks                            and benefits of the procedure and the sedation                            options and risks were discussed with the patient.                            All  questions were answered, and informed consent                            was obtained. Prior Anticoagulants: The patient has                            taken no anticoagulant or antiplatelet agents                            except for NSAID medication. ASA Grade Assessment:                            III - A patient with severe systemic disease. After                             reviewing the risks and benefits, the patient was                            deemed in satisfactory condition to undergo the                            procedure.                           After obtaining informed consent, the colonoscope                            was passed under direct vision. Throughout the                            procedure, the patient's blood pressure, pulse, and                            oxygen saturations were monitored continuously. The                            [id number]-HQ190L ([id number] Olympus colonoscope was                            introduced through the anus and advanced to the the                            cecum, identified by appendiceal orifice and                            ileocecal valve. The colonoscopy was somewhat                            difficult due to significant looping and a tortuous                            colon. Successful completion of the procedure was  aided by changing the patient's position, using                            manual pressure, straightening and shortening the                            scope to obtain bowel loop reduction and using                            scope torsion. The patient was placed in an                            abdominal binder to also aid in colonoscopy. Scope In: 12:33:59 PM Scope Out: 1:15:01 PM Scope Withdrawal Time: 0 hours 31 minutes 41 seconds  Total Procedure Duration: 0 hours 41 minutes 2 seconds  Findings:      The digital rectal exam findings include hemorrhoids. Pertinent       negatives include no palpable rectal lesions.      One medium-sized angiodysplastic lesion with typical arborization was       found in the proximal ascending colon. Area was successfully injected       with 2 mL saline for lesion assessment, and this injection appeared to       lift the lesion adequately. Coagulation for bleeding prevention using       argon plasma was successful.       A medium post mucosectomy scar was found in the ascending colon. There       was no evidence of the previous polyp.      A medium post mucosectomy scar was found in the ascending colon. There       was recurrent polypoid tissue (approximately 9 mm in size). Preparations       were made for mucosal resection. Demarcation of the lesion was performed       with high-definition white light and narrow band imaging to clearly       identify the boundaries of the lesion. Saline was injected to raise the       lesion. Snare mucosal resection was performed. Resection and retrieval       were complete. Resected tissue margins were examined and clear of polyp       tissue. Fulguration to ablate the lesion margin/base by argon plasma was       successful. To prevent bleeding post-intervention, one hemostatic clip       was successfully placed (MR conditional). There was no bleeding during,       or at the end, of the procedure.      Four sessile polyps were found in the recto-sigmoid colon (1), sigmoid       colon (2) and hepatic flexure (1). The polyps were 3 to 8 mm in size.       These polyps were removed with a cold snare. Resection and retrieval       were complete.      Multiple small-mouthed diverticula were found in the recto-sigmoid colon       and sigmoid colon.      Normal mucosa was found in the entire colon otherwise.      Non-bleeding non-thrombosed external and internal hemorrhoids were found       during retroflexion, during perianal exam  and during digital exam. The       hemorrhoids were Grade III (internal hemorrhoids that prolapse but       require manual reduction). Impression:               - Hemorrhoids found on digital rectal exam.                           - One colonic angiodysplastic lesion. Injected                            saline to lift due to size. Treated with argon                            plasma coagulation (APC).                           - Post mucosectomy  scar in the ascending colon - no                            recurrence noted.                           - Post mucosectomy scar in the ascending colon -                            small 9 mm recurrence. Lifted for mucosal resection                            attempt. Snare resection performed. Treated with                            argon plasma coagulation (APC)/margins/base. Clip                            (MR conditional) was placed.                           - Four, 3 to 8 mm polyps at the recto-sigmoid                            colon, in the sigmoid colon and at the hepatic                            flexure, removed with a cold snare. Resected and                            retrieved.                           - Diverticulosis in the recto-sigmoid colon and in                            the sigmoid colon.                           -  Normal mucosa in the entire examined colon                            otherwise.                           - Non-bleeding non-thrombosed external and internal                            hemorrhoids. Moderate Sedation:      Not Applicable - Patient had care per Anesthesia. Recommendation:           - The patient will be observed post-procedure,                            until all discharge criteria are met.                           - Discharge patient to home.                           - Patient has a contact number available for                            emergencies. The signs and symptoms of potential                            delayed complications were discussed with the                            patient. Return to normal activities tomorrow.                            Written discharge instructions were provided to the                            patient.                           - High fiber diet.                           - Use FiberCon 1-2 tablets PO daily.                           - Continue present medications.                            - Await pathology results.                           - Repeat colonoscopy in 3 years for surveillance.                           - See SBE report for further recommendations in  regards to IDA.                           - The findings and recommendations were discussed                            with the patient.                           - The findings and recommendations were discussed                            with the patient's family. Procedure Code(s):        --- Professional ---                           [phone], Colonoscopy, flexible; with endoscopic                            mucosal resection                           [phone], 59, Colonoscopy, flexible; with control of                            bleeding, any method                           [id number], 59, Colonoscopy, flexible; with removal of                            tumor(s), polyp(s), or other lesion(s) by snare                            technique                           45381, 59, Colonoscopy, flexible; with directed                            submucosal injection(s), any substance Diagnosis Code(s):        --- Professional ---                           K55.20, Angiodysplasia of colon without hemorrhage                           Z98.890, Other specified postprocedural states                           D12.7, Benign neoplasm of rectosigmoid junction                           D12.5, Benign neoplasm of sigmoid colon                           D12.3, Benign neoplasm of transverse colon (hepatic  flexure or splenic flexure)                           K64.2, Third degree hemorrhoids                           Z86.010, Personal history of colonic polyps                           K57.30, Diverticulosis of large intestine without                            perforation or abscess without bleeding CPT copyright 2022 [employer]. All rights reserved. The codes documented  in this report are preliminary and upon coder review may  be revised to meet current compliance requirements. Corliss Parish, MD 11/26/2023 1:38:15 PM Number of Addenda: 0

## 2023-11-27 ENCOUNTER — Encounter: Payer: Self-pay | Admitting: Gastroenterology

## 2023-11-27 ENCOUNTER — Encounter (HOSPITAL_COMMUNITY): Payer: Self-pay | Admitting: Gastroenterology

## 2023-11-27 LAB — SURGICAL PATHOLOGY

## 2023-11-30 ENCOUNTER — Other Ambulatory Visit: Payer: Self-pay | Admitting: Adult Health

## 2023-11-30 DIAGNOSIS — Z1231 Encounter for screening mammogram for malignant neoplasm of breast: Secondary | ICD-10-CM

## 2023-12-03 ENCOUNTER — Inpatient Hospital Stay: Payer: Medicare Other | Attending: Adult Health | Admitting: Adult Health

## 2023-12-03 ENCOUNTER — Encounter: Payer: Self-pay | Admitting: Adult Health

## 2023-12-03 VITALS — BP 170/74 | HR 73 | Temp 98.2°F | Resp 16 | Ht 63.0 in | Wt 124.7 lb

## 2023-12-03 DIAGNOSIS — C50412 Malignant neoplasm of upper-outer quadrant of left female breast: Secondary | ICD-10-CM | POA: Diagnosis not present

## 2023-12-03 DIAGNOSIS — Z923 Personal history of irradiation: Secondary | ICD-10-CM | POA: Insufficient documentation

## 2023-12-03 DIAGNOSIS — F17210 Nicotine dependence, cigarettes, uncomplicated: Secondary | ICD-10-CM | POA: Diagnosis not present

## 2023-12-03 DIAGNOSIS — Z170 Estrogen receptor positive status [ER+]: Secondary | ICD-10-CM

## 2023-12-03 DIAGNOSIS — F17200 Nicotine dependence, unspecified, uncomplicated: Secondary | ICD-10-CM | POA: Diagnosis not present

## 2023-12-03 DIAGNOSIS — Z853 Personal history of malignant neoplasm of breast: Secondary | ICD-10-CM | POA: Insufficient documentation

## 2023-12-03 NOTE — Assessment & Plan Note (Signed)
 History of Left Breast Cancer High grade DCIS, ER/PR positive, treated with lumpectomy, radiation, and anti-estrogen therapy. No concerning findings on physical exam. -Continue annual mammograms.  Lung Cancer Screening Shared Decision Making Conversation I discussed screening Low Dose Ct chest without contrast for early detection of lung cancer in this Counseling and Shared Decision-Making Visit Patient [name] with July 03, 1956 and Age 68 y.o. years met the following criteria and I counseled that in  A) age 80-75 AND B) smoking history of 20 pack year smoking AND C) Current smoker or one who has quit smoking within the last 15 years I counseled - Annual low dose CT chest can pick up lung cancer early and has potential to save lives and cure lung cancer - This is similar in concept to screening mammogram, colonoscopies and pap smears - I explained Ct scan chest is low dose radiation CT chest - I explained early lung cancer asymptomatic and only way to detect is CT  With the real advantage that early lung cancer is curable through radiation or surgery - I explained CT superior to CXR - I explained that false positives are present and can incur cost and workup like biopsies, additional scan but benefit outweighs risk - I counseled that patient should QUIT SMOKING - I counseled that patient should adhered to protocol requirements of scan and followup scans  Iron Deficiency Anemia/Colon Polyps History of internal bleeding and polyps. Currently managed with oral iron supplementation. -Follow up with Dr. Meridee Score for further management. -Consider iron infusion if oral supplementation is insufficient.  Osteopenia T-score of -2. Discussed contributing factors including history of anastrozole use and tobacco use. -Recommended bone density testing every 2 years.  Poor Diet Limited intake of fruits and vegetables. -Encouraged to make small changes to improve diet.

## 2023-12-03 NOTE — Progress Notes (Signed)
 [facility] Cancer Follow up:    Jean Brooks, MD [address] [place] [address]   DIAGNOSIS:  Cancer Staging  No matching staging information was found for the patient.   SUMMARY OF ONCOLOGIC HISTORY: Oncology History  Breast cancer of upper-outer quadrant of left female breast (HCC)  01/30/2007 Surgery   Left breast lumpectomy: High-grade DCIS with focal necrosis 1.7 cm ER 54%, PR 74%   03/26/2007 - 05/13/2007 Radiation Therapy   Adjuvant radiation therapy   06/05/2007 - 06/05/2012 Anti-estrogen oral therapy   Arimidex 1 mg daily     CURRENT THERAPY:  INTERVAL HISTORY:  Discussed the use of AI scribe software for clinical note transcription with the patient, who gave verbal consent to proceed.  [name] 68 y.o. female with a history of left breast cancer (high grade DCIS, ER, PR positive) from 2008, treated with lumpectomy, radiation, and anti-estrogen therapy, presents for a routine follow-up. She also has a 20+ year tobacco history and undergoes annual lung cancer screening, most recently in October 2024, which was negative. Her most recent bone density in February showed osteopenia with a T score of -2. Her last mammogram was in April.  The patient continues to smoke, currently about half a pack per day, which is an improvement from before. She has been provided resources for smoking cessation.  She is not yet ready to quit.   She also has some thinning of the bones, likely due to tobacco use. She has lost some height, which could be due to bone thinning or measurement error.  The patient exercises regularly but admits her fruit and vegetable intake is not good.  She had a colonoscopy and was found to have internal bleeding. She is currently managing it with medication, but there is a possibility of needing surgery at [facility]. She also has four polyps that were found and removed.   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   Chronic idiopathic constipation 11/03/2021   Diverticular disease of colon 11/03/2021   Iron deficiency anemia 11/03/2021   History of colonic polyps 11/03/2021   Carpal tunnel syndrome of right wrist 10/05/2017   Coronary artery calcification seen on CAT scan    Smoker 02/17/2013   Occlusion and stenosis of carotid artery without mention of cerebral infarction 09/23/2012   Breast cancer of upper-outer quadrant of left female breast (HCC) 09/07/2011   ADHD (attention deficit hyperactivity disorder) 09/07/2011   DVT (deep venous thrombosis) (HCC) 09/07/2011   Hypothyroid 09/07/2011    is allergic to penicillins.  MEDICAL HISTORY: Past Medical History:  Diagnosis Date   ADHD (attention deficit hyperactivity disorder) 09/07/2011   Anesthesia complication 09/20/2020   Will need GETA for future endoscopy procedures. Did not tolerate MAC well.   Arthritis    Carotid artery occlusion    Carpal tunnel syndrome    Coronary artery calcification seen on CAT scan    Coronary artery disease    DCIS (ductal carcinoma in situ) of breast  09/07/2011   Diverticulosis    DVT (deep venous thrombosis) (HCC) 09/07/2011   Hypertension    on no medications   Hypothyroid 09/07/2011   Internal hemorrhoids    Iron deficiency anemia    Personal history of radiation therapy 2012   Pneumonia    Smoker 02/17/2013   Tubular adenoma of colon     SURGICAL HISTORY: Past Surgical History:  Procedure Laterality  Date   ABDOMINAL HYSTERECTOMY     BIOPSY  12/04/2019   Procedure: BIOPSY;  Surgeon: Charna Elizabeth, MD;  Location: [facility];  Service: Endoscopy;;   BIOPSY  06/14/2021   Procedure: BIOPSY;  Surgeon: Lemar Lofty., MD;  Location: [facility];  Service: Gastroenterology;;   BREAST BIOPSY Left 2013   BREAST LUMPECTOMY  12/2011   Left breast   COLONOSCOPY     COLONOSCOPY WITH PROPOFOL N/A 12/04/2019   Procedure: COLONOSCOPY WITH PROPOFOL;  Surgeon: Charna Elizabeth, MD;  Location: [facility];  Service: Endoscopy;  Laterality: N/A;   COLONOSCOPY WITH PROPOFOL N/A 09/20/2020   Procedure: COLONOSCOPY WITH PROPOFOL;  Surgeon: Meridee Score Netty Starring., MD;  Location: [facility] ENDOSCOPY;  Service: Gastroenterology;  Laterality: N/A;   COLONOSCOPY WITH PROPOFOL N/A 06/14/2021   Procedure: COLONOSCOPY WITH PROPOFOL;  Surgeon: Meridee Score Netty Starring., MD;  Location: [facility];  Service: Gastroenterology;  Laterality: N/A;   COLONOSCOPY WITH PROPOFOL N/A 11/26/2023   Procedure: COLONOSCOPY WITH PROPOFOL;  Surgeon: Meridee Score Netty Starring., MD;  Location: [facility];  Service: Gastroenterology;  Laterality: N/A;   ENDOSCOPIC MUCOSAL RESECTION N/A 09/20/2020   Procedure: ENDOSCOPIC MUCOSAL RESECTION;  Surgeon: Meridee Score Netty Starring., MD;  Location: [facility] ENDOSCOPY;  Service: Gastroenterology;  Laterality: N/A;   ENDOSCOPIC MUCOSAL RESECTION N/A 11/26/2023   Procedure: ENDOSCOPIC MUCOSAL RESECTION;  Surgeon: Meridee Score Netty Starring., MD;  Location: [facility];  Service: Gastroenterology;  Laterality: N/A;   ENTEROSCOPY N/A 11/26/2023   Procedure: ENTEROSCOPY;  Surgeon: Meridee Score Netty Starring., MD;  Location: [facility];  Service: Gastroenterology;  Laterality: N/A;   ESOPHAGOGASTRODUODENOSCOPY (EGD) WITH PROPOFOL N/A 12/04/2019   Procedure: ESOPHAGOGASTRODUODENOSCOPY (EGD) WITH PROPOFOL;  Surgeon: Charna Elizabeth, MD;  Location: [facility];  Service: Endoscopy;  Laterality: N/A;    ESOPHAGOGASTRODUODENOSCOPY (EGD) WITH PROPOFOL N/A 06/01/2022   Procedure: ESOPHAGOGASTRODUODENOSCOPY (EGD) WITH PROPOFOL;  Surgeon: Meridee Score Netty Starring., MD;  Location: [facility];  Service: Gastroenterology;  Laterality: N/A;   EYE SURGERY Right 06/2013   Lasix - Laser   GIVENS CAPSULE STUDY N/A 06/01/2022   Procedure: GIVENS CAPSULE STUDY;  Surgeon: Lemar Lofty., MD;  Location: [facility];  Service: Gastroenterology;  Laterality: N/A;   HEMOSTASIS CLIP PLACEMENT  09/20/2020   Procedure: HEMOSTASIS CLIP PLACEMENT;  Surgeon: Lemar Lofty., MD;  Location: [facility] ENDOSCOPY;  Service: Gastroenterology;;   HOT HEMOSTASIS N/A 06/01/2022   Procedure: HOT HEMOSTASIS (ARGON PLASMA COAGULATION/BICAP);  Surgeon: Lemar Lofty., MD;  Location: [facility];  Service: Gastroenterology;  Laterality: N/A;   HOT HEMOSTASIS N/A 11/26/2023   Procedure: HOT HEMOSTASIS (ARGON PLASMA COAGULATION/BICAP);  Surgeon: Lemar Lofty., MD;  Location: [facility];  Service: Gastroenterology;  Laterality: N/A;   POLYPECTOMY  12/04/2019   Procedure: POLYPECTOMY;  Surgeon: Charna Elizabeth, MD;  Location: [facility];  Service: Endoscopy;;   POLYPECTOMY  09/20/2020   Procedure: POLYPECTOMY;  Surgeon: Lemar Lofty., MD;  Location: [facility] ENDOSCOPY;  Service: Gastroenterology;;   POLYPECTOMY  06/14/2021   Procedure: POLYPECTOMY;  Surgeon: Lemar Lofty., MD;  Location: [facility];  Service: Gastroenterology;;   POLYPECTOMY  11/26/2023  Procedure: POLYPECTOMY;  Surgeon: Mansouraty, Netty Starring., MD;  Location: [facility];  Service: Gastroenterology;;   Sunnie Nielsen LIFTING INJECTION  09/20/2020   Procedure: SUBMUCOSAL LIFTING INJECTION;  Surgeon: Lemar Lofty., MD;  Location: [facility] ENDOSCOPY;  Service: Gastroenterology;;   Brooke Dare INJECTION  11/26/2023   Procedure: SUBMUCOSAL LIFTING INJECTION;  Surgeon: Lemar Lofty., MD;  Location: [facility];   Service: Gastroenterology;;   SUBMUCOSAL TATTOO INJECTION  11/26/2023   Procedure: SUBMUCOSAL TATTOO INJECTION;  Surgeon: Lemar Lofty., MD;  Location: [facility];  Service: Gastroenterology;;   UPPER GASTROINTESTINAL ENDOSCOPY      SOCIAL HISTORY: Social History   Socioeconomic History   Marital status: Divorced    Spouse name: Not on file   Number of children: Not on file   Years of education: Not on file   Highest education level: Not on file  Occupational History   Not on file  Tobacco Use   Smoking status: Every Day    Current packs/day: 0.50    Average packs/day: 0.5 packs/day for 40.0 years (20.0 ttl pk-yrs)    Types: Cigarettes   Smokeless tobacco: Never  Vaping Use   Vaping status: Never Used  Substance and Sexual Activity   Alcohol use: Yes    Alcohol/week: 0.0 standard drinks of alcohol    Comment: 4 mixed drinks per week    Drug use: No   Sexual activity: Never  Other Topics Concern   Not on file  Social History Narrative   Not on file   Social Drivers of Health   Financial Resource Strain: Low Risk  (11/14/2022)   Overall Financial Resource Strain (CARDIA)    Difficulty of Paying Living Expenses: Not hard at all  Food Insecurity: No Food Insecurity (11/14/2022)   Hunger Vital Sign    Worried About Running Out of Food in the Last Year: Never true    Ran Out of Food in the Last Year: Never true  Transportation Needs: No Transportation Needs (11/14/2022)   PRAPARE - Administrator, Civil Service (Medical): No    Lack of Transportation (Non-Medical): No  Physical Activity: Sufficiently Active (11/14/2022)   Exercise Vital Sign    Days of Exercise per Week: 7 days    Minutes of Exercise per Session: 30 min  Stress: No Stress Concern Present (11/14/2022)   [employer] of Occupational Health - Occupational Stress Questionnaire    Feeling of Stress : Not at all  Social Connections: Moderately Integrated (11/14/2022)   Social  Connection and Isolation Panel [NHANES]    Frequency of Communication with Friends and Family: Once a week    Frequency of Social Gatherings with Friends and Family: Three times a week    Attends Religious Services: 1 to 4 times per year    Active Member of Clubs or Organizations: Yes    Attends Banker Meetings: More than 4 times per year    Marital Status: Divorced  Intimate Partner Violence: Not At Risk (11/14/2022)   Humiliation, Afraid, Rape, and Kick questionnaire    Fear of Current or Ex-Partner: No    Emotionally Abused: No    Physically Abused: No    Sexually Abused: No    FAMILY HISTORY: Family History  Problem Relation Age of Onset   Stomach cancer Father    Esophageal cancer Father    Diabetes Maternal Grandmother    Crohn's disease Daughter    Ulcerative colitis Daughter    Colon cancer Neg Hx  Pancreatic cancer Neg Hx    Liver disease Neg Hx    Inflammatory bowel disease Neg Hx    Rectal cancer Neg Hx     Review of Systems  Constitutional:  Negative for appetite change, chills, fatigue, fever and unexpected weight change.  HENT:   Negative for hearing loss, lump/mass, mouth sores and trouble swallowing.   Eyes:  Negative for eye problems and icterus.  Respiratory:  Negative for chest tightness, cough and shortness of breath.   Cardiovascular:  Negative for chest pain, leg swelling and palpitations.  Gastrointestinal:  Negative for abdominal distention, abdominal pain, constipation, diarrhea, nausea and vomiting.  Endocrine: Negative for hot flashes.  Genitourinary:  Negative for difficulty urinating.   Musculoskeletal:  Negative for arthralgias.  Skin:  Negative for itching and rash.  Neurological:  Negative for dizziness, extremity weakness, headaches and numbness.  Hematological:  Negative for adenopathy. Does not bruise/bleed easily.  Psychiatric/Behavioral:  Negative for depression. The patient is not nervous/anxious.       PHYSICAL  EXAMINATION    Vitals:   12/03/23 1035  BP: (!) 170/74  Pulse: 73  Resp: 16  Temp: 98.2 F (36.8 C)  SpO2: 100%    Physical Exam Constitutional:      General: She is not in acute distress.    Appearance: Normal appearance. She is not toxic-appearing.  HENT:     Head: Normocephalic and atraumatic.     Mouth/Throat:     Mouth: Mucous membranes are moist.     Pharynx: Oropharynx is clear. No oropharyngeal exudate or posterior oropharyngeal erythema.  Eyes:     General: No scleral icterus. Cardiovascular:     Rate and Rhythm: Normal rate and regular rhythm.     Pulses: Normal pulses.     Heart sounds: Normal heart sounds.  Pulmonary:     Effort: Pulmonary effort is normal.     Breath sounds: Normal breath sounds.  Chest:     Comments: Breast status postlumpectomy and radiation no sign of local recurrence right breast is benign. Abdominal:     General: Abdomen is flat. Bowel sounds are normal. There is no distension.     Palpations: Abdomen is soft.     Tenderness: There is no abdominal tenderness.  Musculoskeletal:        General: No swelling.     Cervical back: Neck supple.  Lymphadenopathy:     Cervical: No cervical adenopathy.     Upper Body:     Right upper body: No supraclavicular or axillary adenopathy.     Left upper body: No supraclavicular or axillary adenopathy.  Skin:    General: Skin is warm and dry.     Findings: No rash.  Neurological:     General: No focal deficit present.     Mental Status: She is alert.  Psychiatric:        Mood and Affect: Mood normal.        Behavior: Behavior normal.       ASSESSMENT and THERAPY PLAN:   Breast cancer of upper-outer quadrant of left female breast History of Left Breast Cancer High grade DCIS, ER/PR positive, treated with lumpectomy, radiation, and anti-estrogen therapy. No concerning findings on physical exam. -Continue annual mammograms.  Lung Cancer Screening Shared Decision Making Conversation I  discussed screening Low Dose Ct chest without contrast for early detection of lung cancer in this Counseling and Shared Decision-Making Visit Patient [name] with 17-Jul-1956 and Age 68 y.o. years met the  following criteria and I counseled that in  A) age 20-75 AND B) smoking history of 20 pack year smoking AND C) Current smoker or one who has quit smoking within the last 15 years I counseled - Annual low dose CT chest can pick up lung cancer early and has potential to save lives and cure lung cancer - This is similar in concept to screening mammogram, colonoscopies and pap smears - I explained Ct scan chest is low dose radiation CT chest - I explained early lung cancer asymptomatic and only way to detect is CT  With the real advantage that early lung cancer is curable through radiation or surgery - I explained CT superior to CXR - I explained that false positives are present and can incur cost and workup like biopsies, additional scan but benefit outweighs risk - I counseled that patient should QUIT SMOKING - I counseled that patient should adhered to protocol requirements of scan and followup scans  Iron Deficiency Anemia/Colon Polyps History of internal bleeding and polyps. Currently managed with oral iron supplementation. -Follow up with Dr. Meridee Score for further management. -Consider iron infusion if oral supplementation is insufficient.  Osteopenia T-score of -2. Discussed contributing factors including history of anastrozole use and tobacco use. -Recommended bone density testing every 2 years.  Poor Diet Limited intake of fruits and vegetables. -Encouraged to make small changes to improve diet.   All questions were answered. The patient knows to call the clinic with any problems, questions or concerns. We can certainly see the patient much sooner if necessary.  Total encounter time:30 minutes*in face-to-face visit time, chart review, lab review, care coordination, order  entry, and documentation of the encounter time.    Lillard Anes, NP 12/03/23 12:52 PM Medical Oncology and Hematology [facility] [address] [place], [place] [address] Tel. [phone]    Fax. [phone]  *Total Encounter Time as defined by the Centers for Medicare and Medicaid Services includes, in addition to the face-to-face time of a patient visit (documented in the note above) non-face-to-face time: obtaining and reviewing outside history, ordering and reviewing medications, tests or procedures, care coordination (communications with other health care professionals or caregivers) and documentation in the medical record.

## 2023-12-10 ENCOUNTER — Encounter: Payer: Self-pay | Admitting: Family Medicine

## 2023-12-11 ENCOUNTER — Other Ambulatory Visit: Payer: Self-pay | Admitting: Family Medicine

## 2023-12-11 ENCOUNTER — Other Ambulatory Visit

## 2023-12-11 MED ORDER — AMPHETAMINE-DEXTROAMPHETAMINE 10 MG PO TABS: 10.0000 mg | ORAL_TABLET | Freq: Two times a day (BID) | ORAL | 0 refills | Status: DC

## 2023-12-12 ENCOUNTER — Other Ambulatory Visit

## 2023-12-12 DIAGNOSIS — D508 Other iron deficiency anemias: Secondary | ICD-10-CM

## 2023-12-12 DIAGNOSIS — Z13220 Encounter for screening for lipoid disorders: Secondary | ICD-10-CM

## 2023-12-12 DIAGNOSIS — E039 Hypothyroidism, unspecified: Secondary | ICD-10-CM

## 2023-12-12 DIAGNOSIS — I2510 Atherosclerotic heart disease of native coronary artery without angina pectoris: Secondary | ICD-10-CM

## 2023-12-12 NOTE — Addendum Note (Signed)
 Addended by: Venia Carbon K on: 12/12/2023 11:40 AM   Modules accepted: Orders

## 2023-12-13 ENCOUNTER — Encounter: Payer: Self-pay | Admitting: Gastroenterology

## 2023-12-13 LAB — COMPLETE METABOLIC PANEL WITH GFR
AG Ratio: 1.8 (calc) (ref 1.0–2.5)
ALT: 30 U/L — ABNORMAL HIGH (ref 6–29)
AST: 28 U/L (ref 10–35)
Albumin: 4.4 g/dL (ref 3.6–5.1)
Alkaline phosphatase (APISO): 86 U/L (ref 37–153)
BUN: 18 mg/dL (ref 7–25)
CO2: 29 mmol/L (ref 20–32)
Calcium: 9.9 mg/dL (ref 8.6–10.4)
Chloride: 97 mmol/L — ABNORMAL LOW (ref 98–110)
Creat: 0.64 mg/dL (ref 0.50–1.05)
Globulin: 2.5 g/dL (ref 1.9–3.7)
Glucose, Bld: 84 mg/dL (ref 65–99)
Potassium: 4.6 mmol/L (ref 3.5–5.3)
Sodium: 135 mmol/L (ref 135–146)
Total Bilirubin: 0.3 mg/dL (ref 0.2–1.2)
Total Protein: 6.9 g/dL (ref 6.1–8.1)
eGFR: 97 mL/min/{1.73_m2} (ref >=60)

## 2023-12-13 LAB — LIPID PANEL
Cholesterol: 159 mg/dL (ref <200)
HDL: 92 mg/dL (ref >=50)
LDL Cholesterol (Calc): 53 mg/dL
Non-HDL Cholesterol (Calc): 67 mg/dL (ref <130)
Total CHOL/HDL Ratio: 1.7 (calc) (ref <5.0)
Triglycerides: 61 mg/dL (ref <150)

## 2023-12-13 LAB — TSH: TSH: 1.92 m[IU]/L (ref 0.40–4.50)

## 2023-12-13 LAB — IRON,TIBC AND FERRITIN PANEL
%SAT: 75 % — ABNORMAL HIGH (ref 16–45)
Ferritin: 14 ng/mL — ABNORMAL LOW (ref 16–288)
Iron: 348 ug/dL — ABNORMAL HIGH (ref 45–160)
TIBC: 461 ug/dL — ABNORMAL HIGH (ref 250–450)

## 2023-12-13 LAB — CBC WITH DIFFERENTIAL/PLATELET
Absolute Lymphocytes: 1546 {cells}/uL (ref 850–3900)
Absolute Monocytes: 630 {cells}/uL (ref 200–950)
Basophils Absolute: 101 {cells}/uL (ref 0–200)
Basophils Relative: 1.2 %
Eosinophils Absolute: 706 {cells}/uL — ABNORMAL HIGH (ref 15–500)
Eosinophils Relative: 8.4 %
HCT: 41.2 % (ref 35.0–45.0)
Hemoglobin: 13.6 g/dL (ref 11.7–15.5)
MCH: 31.7 pg (ref 27.0–33.0)
MCHC: 33 g/dL (ref 32.0–36.0)
MCV: 96 fL (ref 80.0–100.0)
MPV: 10.4 fL (ref 7.5–12.5)
Monocytes Relative: 7.5 %
Neutro Abs: 5418 {cells}/uL (ref 1500–7800)
Neutrophils Relative %: 64.5 %
Platelets: 597 10*3/uL — ABNORMAL HIGH (ref 140–400)
RBC: 4.29 10*6/uL (ref 3.80–5.10)
RDW: 12.2 % (ref 11.0–15.0)
Total Lymphocyte: 18.4 %
WBC: 8.4 10*3/uL (ref 3.8–10.8)

## 2023-12-17 ENCOUNTER — Other Ambulatory Visit: Payer: Self-pay | Admitting: Family Medicine

## 2023-12-17 ENCOUNTER — Ambulatory Visit: Payer: Medicare Other

## 2023-12-18 LAB — FECAL GLOBIN BY IMMUNOCHEMISTRY
FECAL GLOBIN RESULT:: DETECTED — AB
MICRO NUMBER:: 16209316
SPECIMEN QUALITY:: ADEQUATE

## 2023-12-18 NOTE — Telephone Encounter (Signed)
 Requested Prescriptions  Pending Prescriptions Disp Refills   naproxen (NAPROSYN) 375 MG tablet [Pharmacy Med Name: NAPROXEN TABS 375MG ] 60 tablet 0    Sig: TAKE 1 TABLET TWICE A DAY WITH MEALS     Analgesics:  NSAIDS Failed - 12/18/2023  1:44 PM      Failed - Manual Review: Labs are only required if the patient has taken medication for more than 8 weeks.      Failed - PLT in normal range and within 360 days    Platelets  Date Value Ref Range Status  12/12/2023 597 (H) 140 - 400 Thousand/uL Final  08/04/2014 455 (H) 145 - 400 10e3/uL Final   Platelet Count  Date Value Ref Range Status  11/25/2021 485 (H) 150 - 400 K/uL Final         Failed - Valid encounter within last 12 months    Recent Outpatient Visits           2 years ago Encounter for screening mammogram for malignant neoplasm of breast   [facility] Pickard, Priscille Heidelberg, MD   3 years ago Annual physical exam   [facility] Donita Brooks, MD   3 years ago Other iron deficiency anemia   [facility] Elmore Guise, FNP   4 years ago Other iron deficiency anemia   [facility] Pickard, Priscille Heidelberg, MD   5 years ago Postmenopausal estrogen deficiency   [facility] Pickard, Priscille Heidelberg, MD              Passed - Cr in normal range and within 360 days    Creatinine  Date Value Ref Range Status  08/04/2014 0.8 0.6 - 1.1 mg/dL Final   Creat  Date Value Ref Range Status  12/12/2023 0.64 0.50 - 1.05 mg/dL Final         Passed - HGB in normal range and within 360 days    Hemoglobin  Date Value Ref Range Status  12/12/2023 13.6 11.7 - 15.5 g/dL Final  52/84/1324 40.1 (L) 12.0 - 15.0 g/dL Final   HGB  Date Value Ref Range Status  08/04/2014 14.2 11.6 - 15.9 g/dL Final         Passed - HCT in normal range and within 360 days    HCT  Date Value Ref Range Status  12/12/2023 41.2 35.0 - 45.0 % Final  08/04/2014 42.8 34.8 - 46.6 % Final          Passed - eGFR is 30 or above and within 360 days    GFR, Est African American  Date Value Ref Range Status  10/26/2020 106 > OR = 60 mL/min/1.60m2 Final   GFR, Est Non African American  Date Value Ref Range Status  10/26/2020 92 > OR = 60 mL/min/1.55m2 Final   eGFR  Date Value Ref Range Status  12/12/2023 97 > OR = 60 mL/min/1.71m2 Final         Passed - Patient is not pregnant

## 2023-12-20 ENCOUNTER — Ambulatory Visit (INDEPENDENT_AMBULATORY_CARE_PROVIDER_SITE_OTHER): Admitting: Family Medicine

## 2023-12-20 ENCOUNTER — Encounter: Payer: Self-pay | Admitting: Family Medicine

## 2023-12-20 VITALS — BP 126/62 | HR 81 | Temp 97.9°F | Ht 63.0 in | Wt 125.0 lb

## 2023-12-20 DIAGNOSIS — D500 Iron deficiency anemia secondary to blood loss (chronic): Secondary | ICD-10-CM | POA: Diagnosis not present

## 2023-12-20 DIAGNOSIS — I2510 Atherosclerotic heart disease of native coronary artery without angina pectoris: Secondary | ICD-10-CM | POA: Diagnosis not present

## 2023-12-20 DIAGNOSIS — F909 Attention-deficit hyperactivity disorder, unspecified type: Secondary | ICD-10-CM

## 2023-12-20 DIAGNOSIS — F17200 Nicotine dependence, unspecified, uncomplicated: Secondary | ICD-10-CM | POA: Diagnosis not present

## 2023-12-20 DIAGNOSIS — Z0000 Encounter for general adult medical examination without abnormal findings: Secondary | ICD-10-CM

## 2023-12-20 MED ORDER — VARENICLINE TARTRATE (STARTER) 0.5 MG X 11 & 1 MG X 42 PO TBPK: ORAL_TABLET | ORAL | 0 refills | Status: AC

## 2023-12-20 NOTE — Progress Notes (Signed)
 Subjective:    Patient ID: [name], female    DOB: [birth year], 68 y.o.   MRN: [id number]  HPI  Patient is a 68 year old Caucasian female here today for complete physical exam.  Had DEXA 2/25 which showed osteopenia with T score of 2.0.  Over the last few years she has been dealing with episodic GI bleeding.  She has had a colonoscopy and small bowel endoscopy 11/2023.  I reviewed reports in EPIC.  Unfortunately, the patient continues to smoke.  Her last CAT scan of her lungs was in October.  She is due for repeat CAT scan in October.  She is still on iron.  Her recent iron levels were extremely high and her hemoglobin was stable.  Therefore the patient discontinued her iron.  She has a history of carotid artery stenosis however I informed carotid Dopplers last year and no significant stenosis was seen.  Her most recent blood work is listed below Lab on 12/12/2023  Component Date Value Ref Range Status   WBC 12/12/2023 8.4  3.8 - 10.8 Thousand/uL Final   RBC 12/12/2023 4.29  3.80 - 5.10 Million/uL Final   Hemoglobin 12/12/2023 13.6  11.7 - 15.5 g/dL Final   HCT 13/24/4010 41.2  35.0 - 45.0 % Final   MCV 12/12/2023 96.0  80.0 - 100.0 fL Final   MCH 12/12/2023 31.7  27.0 - 33.0 pg Final   MCHC 12/12/2023 33.0  32.0 - 36.0 g/dL Final   Comment: For adults, a slight decrease in the calculated MCHC value (in the range of 30 to 32 g/dL) is most likely not clinically significant; however, it should be interpreted with caution in correlation with other red cell parameters and the patient's clinical condition.    RDW 12/12/2023 12.2  11.0 - 15.0 % Final   Platelets 12/12/2023 597 (H)  140 - 400 Thousand/uL Final   MPV 12/12/2023 10.4  7.5 - 12.5 fL Final   Neutro Abs 12/12/2023 5,418  1,500 - 7,800 cells/uL Final   Absolute Lymphocytes 12/12/2023 1,546  850 - 3,900 cells/uL Final   Absolute Monocytes 12/12/2023 630  200 - 950 cells/uL Final   Eosinophils Absolute 12/12/2023 706 (H)  15 -  500 cells/uL Final   Basophils Absolute 12/12/2023 101  0 - 200 cells/uL Final   Neutrophils Relative % 12/12/2023 64.5  % Final   Total Lymphocyte 12/12/2023 18.4  % Final   Monocytes Relative 12/12/2023 7.5  % Final   Eosinophils Relative 12/12/2023 8.4  % Final   Basophils Relative 12/12/2023 1.2  % Final   Glucose, Bld 12/12/2023 84  65 - 99 mg/dL Final   Comment: .            Fasting reference interval .    BUN 12/12/2023 18  7 - 25 mg/dL Final   Creat 27/25/3664 0.64  0.50 - 1.05 mg/dL Final   eGFR 40/34/7425 97  > OR = 60 mL/min/1.56m2 Final   BUN/Creatinine Ratio 12/12/2023 SEE NOTE:  6 - 22 (calc) Final   Comment:    Not Reported: BUN and Creatinine are within    reference range. .    Sodium 12/12/2023 135  135 - 146 mmol/L Final   Potassium 12/12/2023 4.6  3.5 - 5.3 mmol/L Final   Chloride 12/12/2023 97 (L)  98 - 110 mmol/L Final   CO2 12/12/2023 29  20 - 32 mmol/L Final   Calcium 12/12/2023 9.9  8.6 - 10.4 mg/dL Final   Total Protein  12/12/2023 6.9  6.1 - 8.1 g/dL Final   Albumin 78/29/5621 4.4  3.6 - 5.1 g/dL Final   Globulin 30/86/5784 2.5  1.9 - 3.7 g/dL (calc) Final   AG Ratio 12/12/2023 1.8  1.0 - 2.5 (calc) Final   Total Bilirubin 12/12/2023 0.3  0.2 - 1.2 mg/dL Final   Alkaline phosphatase (APISO) 12/12/2023 86  37 - 153 U/L Final   AST 12/12/2023 28  10 - 35 U/L Final   ALT 12/12/2023 30 (H)  6 - 29 U/L Final   Cholesterol 12/12/2023 159  <200 mg/dL Final   HDL 69/62/9528 92  > OR = 50 mg/dL Final   Triglycerides 41/32/4401 61  <150 mg/dL Final   LDL Cholesterol (Calc) 12/12/2023 53  mg/dL (calc) Final   Comment: Reference range: <100 . Desirable range <100 mg/dL for primary prevention;   <70 mg/dL for patients with CHD or diabetic patients  with > or = 2 CHD risk factors. Marland Kitchen LDL-C is now calculated using the Martin-Hopkins  calculation, which is a validated novel method providing  better accuracy than the Friedewald equation in the  estimation of  LDL-C.  Horald Pollen et al. Lenox Ahr. 0272;536(64): 2061-2068  (http://education.[facility].com/faq/FAQ164)    Total CHOL/HDL Ratio 12/12/2023 1.7  <4.0 (calc) Final   Non-HDL Cholesterol (Calc) 12/12/2023 67  <130 mg/dL (calc) Final   Comment: For patients with diabetes plus 1 major ASCVD risk  factor, treating to a non-HDL-C goal of <100 mg/dL  (LDL-C of <34 mg/dL) is considered a therapeutic  option.    TSH 12/12/2023 1.92  0.40 - 4.50 mIU/L Final   Iron 12/12/2023 348 (H)  45 - 160 mcg/dL Final   TIBC 74/25/9563 461 (H)  250 - 450 mcg/dL (calc) Final   %SAT 87/56/4332 75 (H)  16 - 45 % (calc) Final   Ferritin 12/12/2023 14 (L)  16 - 288 ng/mL Final   MICRO NUMBER: 12/14/2023 [id number]   Final   SPECIMEN QUALITY: 12/14/2023 Adequate   Final   Source: 12/14/2023 INSURE (TM) FOBT TEST CARD   Final   STATUS: 12/14/2023 FINAL   Final   FECAL GLOBIN RESULT: 12/14/2023 Detected (A)   Final   Detected   COMMENT: 12/14/2023 NOTE: Approved collection includes sample of toilet water adjacent to stool. Other methods of collection such as stool transferred from diaper, bedpan, or commode to toilet water may lead to inaccurate results.   Final      Immunization History  Administered Date(s) Administered   Influenza,inj,Quad PF,6+ Mos 07/20/2014, 09/03/2015, 10/23/2019   Influenza-Unspecified 10/08/2013, 08/16/2016, 08/07/2017, 08/07/2018, 10/15/2022   PFIZER Comirnaty(Gray Top)Covid-19 Tri-Sucrose Vaccine 12/24/2019, 01/14/2020, 06/30/2020   Pneumococcal Conjugate-13 09/04/2016   Pneumococcal Polysaccharide-23 09/05/2012, 10/21/2018   RSV,unspecified 10/15/2022   Zoster Recombinant(Shingrix) 06/19/2017, 10/26/2017    Past Medical History:  Diagnosis Date   ADHD (attention deficit hyperactivity disorder) 09/07/2011   Anesthesia complication 09/20/2020   Will need GETA for future endoscopy procedures. Did not tolerate MAC well.   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   Diverticulosis    DVT (deep venous thrombosis) (HCC) 09/07/2011   Hypertension    on no medications   Hypothyroid 09/07/2011   Internal hemorrhoids    Iron deficiency anemia    Personal history of radiation therapy 2012  Pneumonia    Smoker 02/17/2013   Tubular adenoma of colon    Past Surgical History:  Procedure Laterality Date   ABDOMINAL HYSTERECTOMY     BIOPSY  12/04/2019   Procedure: BIOPSY;  Surgeon: Charna Elizabeth, MD;  Location: [facility];  Service: Endoscopy;;   BIOPSY  06/14/2021   Procedure: BIOPSY;  Surgeon: Lemar Lofty., MD;  Location: [facility];  Service: Gastroenterology;;   BREAST BIOPSY Left 2013   BREAST LUMPECTOMY  12/2011   Left breast   COLONOSCOPY     COLONOSCOPY WITH PROPOFOL N/A 12/04/2019   Procedure: COLONOSCOPY WITH PROPOFOL;  Surgeon: Charna Elizabeth, MD;  Location: [facility];  Service: Endoscopy;  Laterality: N/A;   COLONOSCOPY WITH PROPOFOL N/A 09/20/2020   Procedure: COLONOSCOPY WITH PROPOFOL;  Surgeon: Meridee Score Netty Starring., MD;  Location: [facility] ENDOSCOPY;  Service: Gastroenterology;  Laterality: N/A;   COLONOSCOPY WITH PROPOFOL N/A 06/14/2021   Procedure: COLONOSCOPY WITH PROPOFOL;  Surgeon: Meridee Score Netty Starring., MD;  Location: [facility];  Service: Gastroenterology;  Laterality: N/A;   COLONOSCOPY WITH PROPOFOL N/A 11/26/2023   Procedure: COLONOSCOPY WITH PROPOFOL;  Surgeon: Meridee Score Netty Starring., MD;  Location: [facility];  Service: Gastroenterology;  Laterality: N/A;   ENDOSCOPIC MUCOSAL RESECTION N/A 09/20/2020   Procedure: ENDOSCOPIC MUCOSAL RESECTION;  Surgeon: Meridee Score Netty Starring., MD;  Location: [facility] ENDOSCOPY;  Service: Gastroenterology;  Laterality: N/A;   ENDOSCOPIC MUCOSAL RESECTION N/A 11/26/2023   Procedure: ENDOSCOPIC MUCOSAL RESECTION;  Surgeon: Meridee Score Netty Starring., MD;  Location: [facility];  Service: Gastroenterology;  Laterality: N/A;   ENTEROSCOPY N/A 11/26/2023   Procedure: ENTEROSCOPY;  Surgeon: Meridee Score Netty Starring., MD;  Location: [facility];  Service: Gastroenterology;  Laterality: N/A;   ESOPHAGOGASTRODUODENOSCOPY (EGD) WITH PROPOFOL N/A 12/04/2019   Procedure: ESOPHAGOGASTRODUODENOSCOPY (EGD) WITH PROPOFOL;  Surgeon: Charna Elizabeth, MD;  Location: [facility];  Service: Endoscopy;  Laterality: N/A;   ESOPHAGOGASTRODUODENOSCOPY (EGD) WITH PROPOFOL N/A 06/01/2022   Procedure: ESOPHAGOGASTRODUODENOSCOPY (EGD) WITH PROPOFOL;  Surgeon: Meridee Score Netty Starring., MD;  Location: [facility];  Service: Gastroenterology;  Laterality: N/A;   EYE SURGERY Right 06/2013   Lasix - Laser   GIVENS CAPSULE STUDY N/A 06/01/2022   Procedure: GIVENS CAPSULE STUDY;  Surgeon: Lemar Lofty., MD;  Location: [facility];  Service: Gastroenterology;  Laterality: N/A;   HEMOSTASIS CLIP PLACEMENT  09/20/2020   Procedure: HEMOSTASIS CLIP PLACEMENT;  Surgeon: Lemar Lofty., MD;  Location: [facility] ENDOSCOPY;  Service: Gastroenterology;;   HOT HEMOSTASIS N/A 06/01/2022   Procedure: HOT HEMOSTASIS (ARGON PLASMA COAGULATION/BICAP);  Surgeon: Lemar Lofty., MD;  Location: [facility];  Service: Gastroenterology;  Laterality: N/A;   HOT HEMOSTASIS N/A 11/26/2023   Procedure: HOT HEMOSTASIS (ARGON PLASMA COAGULATION/BICAP);  Surgeon: Lemar Lofty., MD;  Location: [facility] ENDOSCOPY;  Service: Gastroenterology;  Laterality: N/A;   POLYPECTOMY  12/04/2019   Procedure: POLYPECTOMY;  Surgeon: Charna Elizabeth, MD;  Location: [facility];  Service: Endoscopy;;   POLYPECTOMY  09/20/2020   Procedure: POLYPECTOMY;  Surgeon: Lemar Lofty., MD;  Location: [facility] ENDOSCOPY;  Service: Gastroenterology;;   POLYPECTOMY  06/14/2021   Procedure: POLYPECTOMY;  Surgeon: Lemar Lofty., MD;  Location: [facility] ENDOSCOPY;  Service: Gastroenterology;;    POLYPECTOMY  11/26/2023   Procedure: POLYPECTOMY;  Surgeon: Lemar Lofty., MD;  Location: [facility] ENDOSCOPY;  Service: Gastroenterology;;   SUBMUCOSAL LIFTING INJECTION  09/20/2020   Procedure: SUBMUCOSAL LIFTING INJECTION;  Surgeon: Lemar Lofty., MD;  Location: [facility] ENDOSCOPY;  Service: Gastroenterology;;   SUBMUCOSAL LIFTING INJECTION  11/26/2023  Procedure: SUBMUCOSAL LIFTING INJECTION;  Surgeon: Meridee Score Netty Starring., MD;  Location: [facility] ENDOSCOPY;  Service: Gastroenterology;;   SUBMUCOSAL TATTOO INJECTION  11/26/2023   Procedure: SUBMUCOSAL TATTOO INJECTION;  Surgeon: Lemar Lofty., MD;  Location: [facility] END[facility];  Service: Gastroenterology;;   UPPER GASTROINTESTINAL ENDOSCOPY     Current Outpatient Medications on File Prior to Visit  Medication Sig Dispense Refill   acetaminophen (TYLENOL) 650 MG CR tablet Take 650 mg by mouth every 8 (eight) hours as needed for pain.     amphetamine-dextroamphetamine (ADDERALL) 10 MG tablet Take 1 tablet (10 mg total) by mouth 2 (two) times daily. 180 tablet 0   B Complex-C (B-COMPLEX WITH VITAMIN C) tablet Take 1 tablet by mouth daily.     bisacodyl (DULCOLAX) 5 MG EC tablet Take 5 mg by mouth in the morning.     cholecalciferol (VITAMIN D) 1000 UNITS tablet Take 1,000 Units by mouth daily.     esomeprazole (NEXIUM) 40 MG capsule Take 1 capsule (40 mg total) by mouth daily. 90 capsule 3   ferrous sulfate 325 (65 FE) MG EC tablet Take 325 mg by mouth 1 day or 1 dose.     levothyroxine (SYNTHROID) 75 MCG tablet TAKE 1 TABLET DAILY BEFORE BREAKFAST 90 tablet 3   Multiple Vitamin (MULTIVITAMIN WITH MINERALS) TABS tablet Take 1 tablet by mouth daily. Centrum Silver     naproxen (NAPROSYN) 375 MG tablet TAKE 1 TABLET TWICE A DAY WITH MEALS 60 tablet 0   PEG-KCl-NaCl-NaSulf-Na Asc-C (PLENVU) 140 g SOLR Take 1 kit by mouth as directed. Use coupon: BIN: [id number] PNC: CNRX Group: [id number] ID: [id number] 1 each 0   polycarbophil (FIBERCON) 625  MG tablet Take 625 mg by mouth daily.     rosuvastatin (CRESTOR) 20 MG tablet TAKE 1 TABLET BY MOUTH EVERY DAY 90 tablet 1   No current facility-administered medications on file prior to visit.   Allergies  Allergen Reactions   Penicillins Hives    Did it involve swelling of the face/tongue/throat, SOB, or low BP? No Did it involve sudden or severe rash/hives, skin peeling, or any reaction on the inside of your mouth or nose? No Did you need to seek medical attention at a hospital or doctor's office? Yes When did it last happen?      35 years If all above answers are "NO", may proceed with cephalosporin use.   Social History   Socioeconomic History   Marital status: Divorced    Spouse name: Not on file   Number of children: Not on file   Years of education: Not on file   Highest education level: Associate degree: academic program  Occupational History   Not on file  Tobacco Use   Smoking status: Every Day    Current packs/day: 0.50    Average packs/day: 0.5 packs/day for 40.0 years (20.0 ttl pk-yrs)    Types: Cigarettes   Smokeless tobacco: Never  Vaping Use   Vaping status: Never Used  Substance and Sexual Activity   Alcohol use: Yes    Alcohol/week: 0.0 standard drinks of alcohol    Comment: 4 mixed drinks per week    Drug use: No   Sexual activity: Never  Other Topics Concern   Not on file  Social History Narrative   Not on file   Social Drivers of Health   Financial Resource Strain: Low Risk  (12/18/2023)   Overall Financial Resource Strain (CARDIA)    Difficulty of Paying Living Expenses: Not hard  at all  Food Insecurity: No Food Insecurity (12/18/2023)   Hunger Vital Sign    Worried About Running Out of Food in the Last Year: Never true    Ran Out of Food in the Last Year: Never true  Transportation Needs: No Transportation Needs (12/18/2023)   PRAPARE - Administrator, Civil Service (Medical): No    Lack of Transportation (Non-Medical): No   Physical Activity: Insufficiently Active (12/18/2023)   Exercise Vital Sign    Days of Exercise per Week: 3 days    Minutes of Exercise per Session: 30 min  Stress: No Stress Concern Present (12/18/2023)   [employer] of Occupational Health - Occupational Stress Questionnaire    Feeling of Stress : Not at all  Social Connections: Moderately Isolated (12/18/2023)   Social Connection and Isolation Panel [NHANES]    Frequency of Communication with Friends and Family: Three times a week    Frequency of Social Gatherings with Friends and Family: Twice a week    Attends Religious Services: More than 4 times per year    Active Member of [employer] or Organizations: No    Attends Engineer, structural: Not on file    Marital Status: Divorced  Intimate Partner Violence: Not At Risk (11/14/2022)   Humiliation, Afraid, Rape, and Kick questionnaire    Fear of Current or Ex-Partner: No    Emotionally Abused: No    Physically Abused: No    Sexually Abused: No   Family History  Problem Relation Age of Onset   Stomach cancer Father    Esophageal cancer Father    Diabetes Maternal Grandmother    Crohn's disease Daughter    Ulcerative colitis Daughter    Colon cancer Neg Hx    Pancreatic cancer Neg Hx    Liver disease Neg Hx    Inflammatory bowel disease Neg Hx    Rectal cancer Neg Hx       Review of Systems  All other systems reviewed and are negative.      Objective:   Physical Exam Vitals reviewed.  Constitutional:      General: She is not in acute distress.    Appearance: She is well-developed. She is not diaphoretic.  HENT:     Head: Normocephalic and atraumatic.     Right Ear: External ear normal.     Left Ear: External ear normal.     Nose: Nose normal.     Mouth/Throat:     Pharynx: No oropharyngeal exudate.  Eyes:     General: No scleral icterus.       Right eye: No discharge.        Left eye: No discharge.     Conjunctiva/sclera: Conjunctivae normal.      Pupils: Pupils are equal, round, and reactive to light.  Neck:     Thyroid: No thyromegaly.     Vascular: No JVD.     Trachea: No tracheal deviation.  Cardiovascular:     Rate and Rhythm: Normal rate and regular rhythm.     Heart sounds: Normal heart sounds. No murmur heard.    No friction rub. No gallop.  Pulmonary:     Effort: Pulmonary effort is normal. No respiratory distress.     Breath sounds: Normal breath sounds. No stridor. No wheezing or rales.  Chest:     Chest wall: No tenderness.  Abdominal:     General: Bowel sounds are normal. There is no distension.     Palpations:  Abdomen is soft. There is no mass.     Tenderness: There is no abdominal tenderness. There is no guarding or rebound.  Musculoskeletal:        General: No tenderness. Normal range of motion.     Cervical back: Normal range of motion and neck supple.  Lymphadenopathy:     Cervical: No cervical adenopathy.  Skin:    General: Skin is warm.     Coloration: Skin is not pale.     Findings: No erythema or rash.  Neurological:     Mental Status: She is alert and oriented to person, place, and time.     Cranial Nerves: No cranial nerve deficit.     Motor: No abnormal muscle tone.     Coordination: Coordination normal.     Deep Tendon Reflexes: Reflexes are normal and symmetric.  Psychiatric:        Behavior: Behavior normal.        Thought Content: Thought content normal.        Judgment: Judgment normal.           Assessment & Plan:  Encounter for Medicare annual wellness exam  Smoker  ASCVD (arteriosclerotic cardiovascular disease)  Attention deficit hyperactivity disorder (ADHD), unspecified ADHD type  Iron deficiency anemia due to chronic blood loss Patient will stop iron.  We will check blood counts every 3 months.  If her hemoglobin starts to drop, we can resume iron at that point.  GI is following the patient.  Recommended smoking cessation and gave her prescription for Chantix.  Patient  will call me in October which she is scheduled for CT scan of her lungs to screen for lung cancer.  Patient has osteopenia on her bone density.  Recommend calcium 1200 mg a day.  Blood pressure and cholesterol are outstanding.

## 2023-12-28 NOTE — Progress Notes (Signed)
 done

## 2023-12-28 NOTE — Addendum Note (Signed)
 Addended by: Lynnea Ferrier T on: 12/28/2023 06:38 AM   Modules accepted: Level of Service

## 2024-01-02 ENCOUNTER — Encounter: Payer: Self-pay | Admitting: Family Medicine

## 2024-01-02 ENCOUNTER — Ambulatory Visit: Payer: Self-pay

## 2024-01-02 NOTE — Telephone Encounter (Signed)
  Chief Complaint: cough Symptoms: Cough Frequency: Onset within 2 weeks Pertinent Negatives: Patient denies fever, difficulty breathing, chest pain. Disposition: [] ED /[] Urgent Care (no appt availability in office) / [x] Appointment(In office/virtual)/ []  [place] [facility]/ [] Home Care/ [] Refused Recommended Disposition /[] [place] [facility]/ []  Follow-up with PCP Additional Notes:  Requesting cough medicine prescription called in, explained exam will be needed. Cough started one week ago while she was traveling out of states. Cough is not improving, productive of green mucous. Green nasal discharge. Throat was painful at onset of illness but has seemed to resolved. Denies all other symptoms. Has not done any home testing. Acute visit scheduled with an alternate provider on 01/03/24, due to no acute available with PCP. Educated on care advice as documented in protocol, patient verbalized understanding. Discussed reasons to call back.   Copied from CRM [id number]. Topic: Clinical - Red Word Triage >> Jan 02, 2024 12:44 PM [name] wrote: [facility] that prompted transfer to Nurse Triage: worsening cough w/green mucus Reason for Disposition  [1] Continuous (nonstop) coughing interferes with work or school AND [2] no improvement using cough treatment per Care Advice  Protocols used: Cough - Acute Productive-A-AH

## 2024-01-03 ENCOUNTER — Encounter: Payer: Self-pay | Admitting: Family Medicine

## 2024-01-03 ENCOUNTER — Ambulatory Visit (INDEPENDENT_AMBULATORY_CARE_PROVIDER_SITE_OTHER): Admitting: Family Medicine

## 2024-01-03 VITALS — BP 140/84 | HR 87 | Temp 97.3°F | Ht 63.0 in | Wt 126.0 lb

## 2024-01-03 DIAGNOSIS — J019 Acute sinusitis, unspecified: Secondary | ICD-10-CM | POA: Diagnosis not present

## 2024-01-03 MED ORDER — DOXYCYCLINE HYCLATE 100 MG PO TABS: 100.0000 mg | ORAL_TABLET | Freq: Two times a day (BID) | ORAL | 0 refills | Status: AC

## 2024-01-03 NOTE — Assessment & Plan Note (Signed)
 Symptoms consistent with sinusitis with mucopurulent rhinorrhea, given 10 day length and worsening symptoms will start Doxycycline 100mg  BID x7d. OTC symptomatic relief. Return to office if symptoms persist or worsen.

## 2024-01-03 NOTE — Progress Notes (Signed)
 Subjective:  HPI: [name] is a 68 y.o. female presenting on 01/03/2024 for Cough (Pt comes in today with complaints of cough for a little over a week and a half. Pt also states she was coughing up green phlegm more recently  )   Cough   Patient is in today for 10 days mucopurulent cough and rhinorrhea with fatigue.  Denies sinus pain or pressure, SOB, wheezing, dental pain, loss of taste or smell, fever, chills, body aches, ear ache, sore throat. Symptoms overall worsening. Has tried nothing. No sick exposure or home covid test but has been traveling.    Review of Systems  Respiratory:  Positive for cough.   All other systems reviewed and are negative.   Relevant past medical history reviewed and updated as indicated.   Past Medical History:  Diagnosis Date   ADHD (attention deficit hyperactivity disorder) 09/07/2011   Anesthesia complication 09/20/2020   Will need GETA for future endoscopy procedures. Did not tolerate MAC well.   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   Diverticulosis    DVT (deep venous thrombosis) (HCC) 09/07/2011   Hypertension    on no medications   Hypothyroid 09/07/2011   Internal hemorrhoids    Iron deficiency anemia    Personal history of radiation therapy 2012   Pneumonia    Smoker 02/17/2013   Tubular adenoma of colon      Past Surgical History:  Procedure Laterality Date   ABDOMINAL HYSTERECTOMY     BIOPSY  12/04/2019   Procedure: BIOPSY;  Surgeon: Charna Elizabeth, MD;  Location: [facility];  Service: Endoscopy;;   BIOPSY  06/14/2021   Procedure: BIOPSY;  Surgeon: Lemar Lofty., MD;  Location: [facility];  Service: Gastroenterology;;   BREAST BIOPSY Left 2013   BREAST LUMPECTOMY  12/2011   Left breast   COLONOSCOPY     COLONOSCOPY WITH PROPOFOL N/A  12/04/2019   Procedure: COLONOSCOPY WITH PROPOFOL;  Surgeon: Charna Elizabeth, MD;  Location: [facility];  Service: Endoscopy;  Laterality: N/A;   COLONOSCOPY WITH PROPOFOL N/A 09/20/2020   Procedure: COLONOSCOPY WITH PROPOFOL;  Surgeon: Meridee Score Netty Starring., MD;  Location: [facility];  Service: Gastroenterology;  Laterality: N/A;   COLONOSCOPY WITH PROPOFOL N/A 06/14/2021   Procedure: COLONOSCOPY WITH PROPOFOL;  Surgeon: Meridee Score Netty Starring., MD;  Location: [facility];  Service: Gastroenterology;  Laterality: N/A;   COLONOSCOPY WITH PROPOFOL N/A 11/26/2023   Procedure: COLONOSCOPY WITH PROPOFOL;  Surgeon: Meridee Score Netty Starring., MD;  Location: [facility];  Service: Gastroenterology;  Laterality: N/A;   ENDOSCOPIC MUCOSAL RESECTION N/A 09/20/2020   Procedure: ENDOSCOPIC MUCOSAL RESECTION;  Surgeon: Meridee Score Netty Starring., MD;  Location: [facility] ENDOSCOPY;  Service: Gastroenterology;  Laterality: N/A;   ENDOSCOPIC MUCOSAL RESECTION N/A 11/26/2023   Procedure: ENDOSCOPIC MUCOSAL RESECTION;  Surgeon: Meridee Score Netty Starring., MD;  Location: [facility];  Service: Gastroenterology;  Laterality: N/A;   ENTEROSCOPY N/A 11/26/2023   Procedure: ENTEROSCOPY;  Surgeon: Meridee Score Netty Starring., MD;  Location: [facility];  Service: Gastroenterology;  Laterality: N/A;   ESOPHAGOGASTRODUODENOSCOPY (EGD) WITH PROPOFOL N/A 12/04/2019   Procedure: ESOPHAGOGASTRODUODENOSCOPY (EGD) WITH PROPOFOL;  Surgeon: Charna Elizabeth, MD;  Location: [facility];  Service: Endoscopy;  Laterality: N/A;   ESOPHAGOGASTRODUODENOSCOPY (EGD) WITH PROPOFOL N/A 06/01/2022   Procedure: ESOPHAGOGASTRODUODENOSCOPY (EGD) WITH PROPOFOL;  Surgeon:  Mansouraty, Netty Starring., MD;  Location: [facility];  Service: Gastroenterology;  Laterality: N/A;   EYE SURGERY Right 06/2013   Lasix - Laser   GIVENS CAPSULE STUDY N/A 06/01/2022   Procedure: GIVENS CAPSULE STUDY;  Surgeon: Lemar Lofty., MD;  Location: [facility];  Service:  Gastroenterology;  Laterality: N/A;   HEMOSTASIS CLIP PLACEMENT  09/20/2020   Procedure: HEMOSTASIS CLIP PLACEMENT;  Surgeon: Lemar Lofty., MD;  Location: [facility] ENDOSCOPY;  Service: Gastroenterology;;   HOT HEMOSTASIS N/A 06/01/2022   Procedure: HOT HEMOSTASIS (ARGON PLASMA COAGULATION/BICAP);  Surgeon: Lemar Lofty., MD;  Location: [facility];  Service: Gastroenterology;  Laterality: N/A;   HOT HEMOSTASIS N/A 11/26/2023   Procedure: HOT HEMOSTASIS (ARGON PLASMA COAGULATION/BICAP);  Surgeon: Lemar Lofty., MD;  Location: [facility];  Service: Gastroenterology;  Laterality: N/A;   POLYPECTOMY  12/04/2019   Procedure: POLYPECTOMY;  Surgeon: Charna Elizabeth, MD;  Location: [facility];  Service: Endoscopy;;   POLYPECTOMY  09/20/2020   Procedure: POLYPECTOMY;  Surgeon: Lemar Lofty., MD;  Location: [facility] ENDOSCOPY;  Service: Gastroenterology;;   POLYPECTOMY  06/14/2021   Procedure: POLYPECTOMY;  Surgeon: Lemar Lofty., MD;  Location: [facility];  Service: Gastroenterology;;   POLYPECTOMY  11/26/2023   Procedure: POLYPECTOMY;  Surgeon: Lemar Lofty., MD;  Location: [facility];  Service: Gastroenterology;;   SUBMUCOSAL LIFTING INJECTION  09/20/2020   Procedure: SUBMUCOSAL LIFTING INJECTION;  Surgeon: Lemar Lofty., MD;  Location: [facility] ENDOSCOPY;  Service: Gastroenterology;;   SUBMUCOSAL LIFTING INJECTION  11/26/2023   Procedure: SUBMUCOSAL LIFTING INJECTION;  Surgeon: Lemar Lofty., MD;  Location: [facility];  Service: Gastroenterology;;   SUBMUCOSAL TATTOO INJECTION  11/26/2023   Procedure: SUBMUCOSAL TATTOO INJECTION;  Surgeon: Lemar Lofty., MD;  Location: [facility];  Service: Gastroenterology;;   UPPER GASTROINTESTINAL ENDOSCOPY      Allergies and medications reviewed and updated.   Current Outpatient Medications:    doxycycline (VIBRA-TABS) 100 MG tablet, Take 1 tablet (100 mg total) by mouth 2 (two)  times daily for 7 days., Disp: 14 tablet, Rfl: 0   acetaminophen (TYLENOL) 650 MG CR tablet, Take 650 mg by mouth every 8 (eight) hours as needed for pain., Disp: , Rfl:    amphetamine-dextroamphetamine (ADDERALL) 10 MG tablet, Take 1 tablet (10 mg total) by mouth 2 (two) times daily., Disp: 180 tablet, Rfl: 0   B Complex-C (B-COMPLEX WITH VITAMIN C) tablet, Take 1 tablet by mouth daily., Disp: , Rfl:    bisacodyl (DULCOLAX) 5 MG EC tablet, Take 5 mg by mouth in the morning., Disp: , Rfl:    cholecalciferol (VITAMIN D) 1000 UNITS tablet, Take 1,000 Units by mouth daily., Disp: , Rfl:    esomeprazole (NEXIUM) 40 MG capsule, Take 1 capsule (40 mg total) by mouth daily., Disp: 90 capsule, Rfl: 3   ferrous sulfate 325 (65 FE) MG EC tablet, Take 325 mg by mouth 1 day or 1 dose., Disp: , Rfl:    levothyroxine (SYNTHROID) 75 MCG tablet, TAKE 1 TABLET DAILY BEFORE BREAKFAST, Disp: 90 tablet, Rfl: 3   Multiple Vitamin (MULTIVITAMIN WITH MINERALS) TABS tablet, Take 1 tablet by mouth daily. Centrum Silver, Disp: , Rfl:    naproxen (NAPROSYN) 375 MG tablet, TAKE 1 TABLET TWICE A DAY WITH MEALS, Disp: 60 tablet, Rfl: 0   polycarbophil (FIBERCON) 625 MG tablet, Take 625 mg by mouth daily., Disp: , Rfl:    rosuvastatin (CRESTOR) 20 MG tablet, TAKE 1 TABLET BY MOUTH EVERY DAY, Disp: 90 tablet,  Rfl: 1   Varenicline Tartrate, Starter, (CHANTIX STARTING MONTH PAK) 0.5 MG X 11 & 1 MG X 42 TBPK, Take 0.5 mg tablet by mouth 1x daily for 3 days, then increase to 0.5 mg tablet 2x daily for 4 days, then increase to 1 mg tablet 2x daily., Disp: 1 each, Rfl: 0  Allergies  Allergen Reactions   Penicillins Hives    Did it involve swelling of the face/tongue/throat, SOB, or low BP? No Did it involve sudden or severe rash/hives, skin peeling, or any reaction on the inside of your mouth or nose? No Did you need to seek medical attention at a hospital or doctor's office? Yes When did it last happen?      35 years If all above  answers are "NO", may proceed with cephalosporin use.    Objective:   BP (!) 140/84   Pulse 87   Temp (!) 97.3 F (36.3 C)   Ht 5\' 3"  (1.6 m)   Wt 126 lb (57.2 kg)   SpO2 96%   BMI 22.32 kg/m      01/03/2024    4:03 PM 12/20/2023    2:02 PM 12/03/2023   10:35 AM  Vitals with BMI  Height 5\' 3"  5\' 3"  5\' 3"   Weight 126 lbs 125 lbs 124 lbs 11 oz  BMI 22.33 22.15 22.1  Systolic 140 126 409  Diastolic 84 62 74  Pulse 87 81 73     Physical Exam Vitals and nursing note reviewed.  Constitutional:      Appearance: Normal appearance. She is normal weight.  HENT:     Head: Normocephalic and atraumatic.     Right Ear: Tympanic membrane, ear canal and external ear normal.     Left Ear: Tympanic membrane, ear canal and external ear normal.     Nose: Nose normal.     Right Sinus: No maxillary sinus tenderness or frontal sinus tenderness.     Left Sinus: No maxillary sinus tenderness or frontal sinus tenderness.     Mouth/Throat:     Mouth: Mucous membranes are moist.     Pharynx: Oropharynx is clear.  Eyes:     Extraocular Movements: Extraocular movements intact.     Conjunctiva/sclera: Conjunctivae normal.     Pupils: Pupils are equal, round, and reactive to light.  Cardiovascular:     Rate and Rhythm: Normal rate and regular rhythm.     Pulses: Normal pulses.     Heart sounds: Normal heart sounds.  Pulmonary:     Effort: Pulmonary effort is normal.     Breath sounds: Normal breath sounds.  Musculoskeletal:     Cervical back: No tenderness.  Lymphadenopathy:     Cervical: No cervical adenopathy.  Skin:    General: Skin is warm and dry.  Neurological:     General: No focal deficit present.     Mental Status: She is alert and oriented to person, place, and time. Mental status is at baseline.  Psychiatric:        Mood and Affect: Mood normal.        Behavior: Behavior normal.        Thought Content: Thought content normal.        Judgment: Judgment normal.      Assessment & Plan:  Acute non-recurrent sinusitis, unspecified location Assessment & Plan: Symptoms consistent with sinusitis with mucopurulent rhinorrhea, given 10 day length and worsening symptoms will start Doxycycline 100mg  BID x7d. OTC symptomatic relief. Return to office  if symptoms persist or worsen.    Other orders -     Doxycycline Hyclate; Take 1 tablet (100 mg total) by mouth 2 (two) times daily for 7 days.  Dispense: 14 tablet; Refill: 0     Follow up plan: Return if symptoms worsen or fail to improve.  Park Meo, FNP

## 2024-01-07 ENCOUNTER — Encounter (HOSPITAL_COMMUNITY)

## 2024-01-16 ENCOUNTER — Encounter: Payer: Self-pay | Admitting: Family Medicine

## 2024-01-17 ENCOUNTER — Other Ambulatory Visit: Payer: Self-pay

## 2024-01-17 ENCOUNTER — Ambulatory Visit (INDEPENDENT_AMBULATORY_CARE_PROVIDER_SITE_OTHER): Payer: Medicare Other | Admitting: *Deleted

## 2024-01-17 DIAGNOSIS — Z0000 Encounter for general adult medical examination without abnormal findings: Secondary | ICD-10-CM | POA: Diagnosis not present

## 2024-01-17 MED ORDER — NAPROXEN 375 MG PO TABS: 375.0000 mg | ORAL_TABLET | Freq: Two times a day (BID) | ORAL | 0 refills | Status: DC

## 2024-01-17 NOTE — Progress Notes (Signed)
 Subjective:   [name] is a 68 y.o. female who presents for Medicare Annual (Subsequent) preventive examination.  Visit Complete: Virtual I connected with  [name] on 01/17/24 by a audio enabled telemedicine application and verified that I am speaking with the correct person using two identifiers.  Patient Location: Home  Provider Location: Home Office  I discussed the limitations of evaluation and management by telemedicine. The patient expressed understanding and agreed to proceed.  Vital Signs: Because this visit was a virtual/telehealth visit, some criteria may be missing or patient reported. Any vitals not documented were not able to be obtained and vitals that have been documented are patient reported.  Patient Medicare AWV questionnaire was completed by the patient on 01-14-2024; I have confirmed that all information answered by patient is correct and no changes since this date.  Cardiac Risk Factors include: advanced age (>8men, >73 women);smoking/ tobacco exposure     Objective:    There were no vitals filed for this visit. There is no height or weight on file to calculate BMI.     01/17/2024    8:57 AM 11/26/2023   10:24 AM 11/21/2022    8:53 AM 11/14/2022   10:22 AM 06/01/2022   10:32 AM 06/14/2021   10:11 AM 11/05/2020    9:02 AM  Advanced Directives  Does Patient Have a Medical Advance Directive? Yes Yes Yes Yes Yes No No  Type of Advance Directive Living will Healthcare Power of [employer] of [place];Living will Living will;Healthcare Power of Attorney Living will    Does patient want to make changes to medical advance directive?   No - Patient declined No - Patient declined     Copy of Healthcare Power of Attorney in Chart?  No - copy requested No - copy requested No - copy requested     Would patient like information on creating a medical advance directive?     No - Patient declined No - Patient declined No - Patient declined    Current  Medications (verified) Outpatient Encounter Medications as of 01/17/2024  Medication Sig   acetaminophen (TYLENOL) 650 MG CR tablet Take 650 mg by mouth every 8 (eight) hours as needed for pain.   amphetamine-dextroamphetamine (ADDERALL) 10 MG tablet Take 1 tablet (10 mg total) by mouth 2 (two) times daily.   B Complex-C (B-COMPLEX WITH VITAMIN C) tablet Take 1 tablet by mouth daily.   bisacodyl (DULCOLAX) 5 MG EC tablet Take 5 mg by mouth in the morning.   cholecalciferol (VITAMIN D) 1000 UNITS tablet Take 1,000 Units by mouth daily.   esomeprazole (NEXIUM) 40 MG capsule Take 1 capsule (40 mg total) by mouth daily.   levothyroxine (SYNTHROID) 75 MCG tablet TAKE 1 TABLET DAILY BEFORE BREAKFAST   Multiple Vitamin (MULTIVITAMIN WITH MINERALS) TABS tablet Take 1 tablet by mouth daily. Centrum Silver   naproxen (NAPROSYN) 375 MG tablet Take 1 tablet (375 mg total) by mouth 2 (two) times daily with a meal.   polycarbophil (FIBERCON) 625 MG tablet Take 625 mg by mouth daily.   rosuvastatin (CRESTOR) 20 MG tablet TAKE 1 TABLET BY MOUTH EVERY DAY   Varenicline Tartrate, Starter, (CHANTIX STARTING MONTH PAK) 0.5 MG X 11 & 1 MG X 42 TBPK Take 0.5 mg tablet by mouth 1x daily for 3 days, then increase to 0.5 mg tablet 2x daily for 4 days, then increase to 1 mg tablet 2x daily.   ferrous sulfate 325 (65 FE) MG EC  tablet Take 325 mg by mouth 1 day or 1 dose.   [DISCONTINUED] naproxen (NAPROSYN) 375 MG tablet TAKE 1 TABLET TWICE A DAY WITH MEALS   No facility-administered encounter medications on file as of 01/17/2024.    Allergies (verified) Penicillins   History: Past Medical History:  Diagnosis Date   ADHD (attention deficit hyperactivity disorder) 09/07/2011   Anesthesia complication 09/20/2020   Will need GETA for future endoscopy procedures. Did not tolerate MAC well.   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   Diverticulosis    DVT (deep venous thrombosis) (HCC) 09/07/2011   Hypertension    on no medications   Hypothyroid 09/07/2011   Internal hemorrhoids    Iron deficiency anemia    Personal history of radiation therapy 2012   Pneumonia    Smoker 02/17/2013   Tubular adenoma of colon    Past Surgical History:  Procedure Laterality Date   ABDOMINAL HYSTERECTOMY     BIOPSY  12/04/2019   Procedure: BIOPSY;  Surgeon: Charna Elizabeth, MD;  Location: [facility];  Service: Endoscopy;;   BIOPSY  06/14/2021   Procedure: BIOPSY;  Surgeon: Lemar Lofty., MD;  Location: [facility];  Service: Gastroenterology;;   BREAST BIOPSY Left 2013   BREAST LUMPECTOMY  12/2011   Left breast   COLONOSCOPY     COLONOSCOPY WITH PROPOFOL N/A 12/04/2019   Procedure: COLONOSCOPY WITH PROPOFOL;  Surgeon: Charna Elizabeth, MD;  Location: [facility];  Service: Endoscopy;  Laterality: N/A;   COLONOSCOPY WITH PROPOFOL N/A 09/20/2020   Procedure: COLONOSCOPY WITH PROPOFOL;  Surgeon: Meridee Score Netty Starring., MD;  Location[facility]COPY;  Service: Gastroenterology;  Laterality: N/A;   COLONOSCOPY WITH PROPOFOL N/A 06/14/2021   Procedure: COLONOSCOPY WITH PROPOFOL;  Surgeon: Meridee Score Netty Starring., MD;  Location: [facility];  Service: Gastroenterology;  Laterality: N/A;   COLONOSCOPY WITH PROPOFOL N/A 11/26/2023   Procedure: COLONOSCOPY WITH PROPOFOL;  Surgeon: Meridee Score Netty Starring., MD;  Location: [facility];  Service: Gastroenterology;  Laterality: N/A;   ENDOSCOPIC MUCOSAL RESECTION N/A 09/20/2020   Procedure: ENDOSCOPIC MUCOSAL RESECTION;  Surgeon: Meridee Score Netty Starring., MD;  Location[facility] ENDOSCOPY;  Service: Gastroenterology;  Laterality: N/A;   ENDOSCOPIC MUCOSAL RESECTION N/A 11/26/2023   Procedure: ENDOSCOPIC MUCOSAL RESECTION;  Surgeon: Meridee Score Netty Starring., MD;  Location: [facility];  Service: Gastroenterology;   Laterality: N/A;   ENTEROSCOPY N/A 11/26/2023   Procedure: ENTEROSCOPY;  Surgeon: Meridee Score Netty Starring., MD;  Location: [facility];  Service: Gastroenterology;  Laterality: N/A;   ESOPHAGOGASTRODUODENOSCOPY (EGD) WITH PROPOFOL N/A 12/04/2019   Procedure: ESOPHAGOGASTRODUODENOSCOPY (EGD) WITH PROPOFOL;  Surgeon: Charna Elizabeth, MD;  Location: [facility];  Service: Endoscopy;  Laterality: N/A;   ESOPHAGOGASTRODUODENOSCOPY (EGD) WITH PROPOFOL N/A 06/01/2022   Procedure: ESOPHAGOGASTRODUODENOSCOPY (EGD) WITH PROPOFOL;  Surgeon: Meridee Score Netty Starring., MD;  Location: [facility];  Service: Gastroenterology;  Laterality: N/A;   EYE SURGERY Right 06/2013   Lasix - Laser   GIVENS CAPSULE STUDY N/A 06/01/2022   Procedure: GIVENS CAPSULE STUDY;  Surgeon: Lemar Lofty., MD;  Location: [facility];  Service: Gastroenterology;  Laterality: N/A;   HEMOSTASIS CLIP PLACEMENT  09/20/2020   Procedure: HEMOSTASIS CLIP PLACEMENT;  Surgeon: Lemar Lofty., MD;  Location[facility] ENDOSCOPY;  Service: Gastroenterology;;   HOT HEMOSTASIS N/A 06/01/2022   Procedure: HOT HEMOSTASIS (ARGON PLASMA  COAGULATION/BICAP);  Surgeon: Lemar Lofty., MD;  Location: [facility];  Service: Gastroenterology;  Laterality: N/A;   HOT HEMOSTASIS N/A 11/26/2023   Procedure: HOT HEMOSTASIS (ARGON PLASMA COAGULATION/BICAP);  Surgeon: Lemar Lofty., MD;  Location: [facility] ENDOSCOPY;  Service: Gastroenterology;  Laterality: N/A;   POLYPECTOMY  12/04/2019   Procedure: POLYPECTOMY;  Surgeon: Charna Elizabeth, MD;  Location: [facility];  Service: Endoscopy;;   POLYPECTOMY  09/20/2020   Procedure: POLYPECTOMY;  Surgeon: Lemar Lofty., MD;  Location: [facility] ENDOSCOPY;  Service: Gastroenterology;;   POLYPECTOMY  06/14/2021   Procedure: POLYPECTOMY;  Surgeon: Lemar Lofty., MD;  Location: [facility];  Service: Gastroenterology;;   POLYPECTOMY  11/26/2023   Procedure: POLYPECTOMY;  Surgeon:  Lemar Lofty., MD;  Location: [facility];  Service: Gastroenterology;;   SUBMUCOSAL LIFTING INJECTION  09/20/2020   Procedure: SUBMUCOSAL LIFTING INJECTION;  Surgeon: Lemar Lofty., MD;  Location: [facility] ENDOSCOPY;  Service: Gastroenterology;;   SUBMUCOSAL LIFTING INJECTION  11/26/2023   Procedure: SUBMUCOSAL LIFTING INJECTION;  Surgeon: Lemar Lofty., MD;  Location: [facility];  Service: Gastroenterology;;   SUBMUCOSAL TATTOO INJECTION  11/26/2023   Procedure: SUBMUCOSAL TATTOO INJECTION;  Surgeon: Lemar Lofty., MD;  Location: [facility];  Service: Gastroenterology;;   UPPER GASTROINTESTINAL ENDOSCOPY     Family History  Problem Relation Age of Onset   Stomach cancer Father    Esophageal cancer Father    Diabetes Maternal Grandmother    Crohn's disease Daughter    Ulcerative colitis Daughter    Colon cancer Neg Hx    Pancreatic cancer Neg Hx    Liver disease Neg Hx    Inflammatory bowel disease Neg Hx    Rectal cancer Neg Hx    Social History   Socioeconomic History   Marital status: Divorced    Spouse name: Not on file   Number of children: Not on file   Years of education: Not on file   Highest education level: Associate degree: academic program  Occupational History   Not on file  Tobacco Use   Smoking status: Every Day    Current packs/day: 0.50    Average packs/day: 0.5 packs/day for 40.0 years (20.0 ttl pk-yrs)    Types: Cigarettes   Smokeless tobacco: Never  Vaping Use   Vaping status: Never Used  Substance and Sexual Activity   Alcohol use: Yes    Alcohol/week: 0.0 standard drinks of alcohol    Comment: 4 mixed drinks per week    Drug use: No   Sexual activity: Never  Other Topics Concern   Not on file  Social History Narrative   Not on file   Social Drivers of Health   Financial Resource Strain: Low Risk  (01/17/2024)   Overall Financial Resource Strain (CARDIA)    Difficulty of Paying Living Expenses: Not hard  at all  Food Insecurity: No Food Insecurity (01/17/2024)   Hunger Vital Sign    Worried About Running Out of Food in the Last Year: Never true    Ran Out of Food in the Last Year: Never true  Transportation Needs: No Transportation Needs (01/17/2024)   PRAPARE - Administrator, Civil Service (Medical): No    Lack of Transportation (Non-Medical): No  Physical Activity: Insufficiently Active (01/17/2024)   Exercise Vital Sign    Days of Exercise per Week: 3 days    Minutes of Exercise per Session: 30 min  Stress: No Stress Concern Present (01/17/2024)   [employer] of  Occupational Health - Occupational Stress Questionnaire    Feeling of Stress : Not at all  Social Connections: Moderately Integrated (01/17/2024)   Social Connection and Isolation Panel [NHANES]    Frequency of Communication with Friends and Family: Three times a week    Frequency of Social Gatherings with Friends and Family: Twice a week    Attends Religious Services: More than 4 times per year    Active Member of [employer] or Organizations: No    Attends Engineer, structural: More than 4 times per year    Marital Status: Divorced  Recent Concern: Social Connections - Moderately Isolated (12/18/2023)   Social Connection and Isolation Panel [NHANES]    Frequency of Communication with Friends and Family: Three times a week    Frequency of Social Gatherings with Friends and Family: Twice a week    Attends Religious Services: More than 4 times per year    Active Member of [employer] or Organizations: No    Attends Engineer, structural: Not on file    Marital Status: Divorced    Tobacco Counseling Ready to quit: Not Answered Counseling given: Not Answered   Clinical Intake:  Pre-visit preparation completed: Yes  Pain : No/denies pain     Diabetes: No  How often do you need to have someone help you when you read instructions, pamphlets, or other written materials from your doctor or  pharmacy?: 1 - Never  Interpreter Needed?: No  Information entered by :: Remi Haggard LPN   Activities of Daily Living    01/17/2024    9:00 AM 01/14/2024    3:59 PM  In your present state of health, do you have any difficulty performing the following activities:  Hearing? 0 0  Vision? 0 0  Difficulty concentrating or making decisions? 0 0  Walking or climbing stairs? 0 0  Dressing or bathing? 0 0  Doing errands, shopping? 0 0  Preparing Food and eating ? N N  Using the Toilet? N N  In the past six months, have you accidently leaked urine? Y Y  Do you have problems with loss of bowel control? Y Y  Managing your Medications? N N  Managing your Finances? N N  Housekeeping or managing your Housekeeping? N N    Patient Care Team: Donita Brooks, MD as PCP - General (Family Medicine) Axel Filler, Larna Daughters, NP as Nurse Practitioner (Hematology and Oncology) Mansouraty, Netty Starring., MD as Consulting Physician (Gastroenterology) Early, Kristen Loader, MD (Inactive) as Consulting Physician (Vascular Surgery) [facility], [facility]  Indicate any recent Medical Services you may have received from other than [facility] providers in the past year (date may be approximate).     Assessment:   This is a routine wellness examination for [place].  Hearing/Vision screen Hearing Screening - Comments:: No trouble hearing Vision Screening - Comments:: Up to date Looking for knew one    Goals Addressed             This Visit's Progress    Patient Stated       Stay healthy /  stay alive       Depression Screen    01/17/2024    9:03 AM 11/14/2022   10:20 AM 09/07/2022   11:41 AM 11/10/2021    3:01 PM 10/23/2019    8:30 AM 10/19/2017    3:02 PM  PHQ 2/9 Scores  PHQ - 2 Score 0 0 0 0 0 0  PHQ- 9 Score 3  0      Fall Risk    01/17/2024    9:00 AM 01/14/2024    3:59 PM 11/14/2022   10:20 AM 11/10/2022   12:27 PM 09/07/2022   11:41 AM  Fall Risk   Falls in the past year? 0 0 0 0 0  Number  falls in past yr: 0 0 0 0 0  Injury with Fall? 0 0 0 0 0  Risk for fall due to :     No Fall Risks  Follow up Falls evaluation completed;Education provided;Falls prevention discussed  Falls prevention discussed;Education provided;Falls evaluation completed  Falls prevention discussed    MEDICARE RISK AT HOME: Medicare Risk at Home Any stairs in or around the home?: No If so, are there any without handrails?: No Home free of loose throw rugs in walkways, pet beds, electrical cords, etc?: Yes Adequate lighting in your home to reduce risk of falls?: Yes Life alert?: No Use of a cane, walker or w/c?: No Grab bars in the bathroom?: Yes Shower chair or bench in shower?: No Elevated toilet seat or a handicapped toilet?: Yes  TIMED UP AND GO:  Was the test performed?  No    Cognitive Function:        01/17/2024    9:01 AM 11/14/2022   10:23 AM  6CIT Screen  What Year? 0 points 0 points  What month? 0 points 0 points  What time? 0 points 0 points  Count back from 20 0 points 0 points  Months in reverse 0 points 0 points  Repeat phrase 0 points 0 points  Total Score 0 points 0 points    Immunizations Immunization History  Administered Date(s) Administered   Influenza,inj,Quad PF,6+ Mos 07/20/2014, 09/03/2015, 10/23/2019   Influenza-Unspecified 10/08/2013, 08/16/2016, 08/07/2017, 08/07/2018, 10/15/2022   PFIZER Comirnaty(Gray Top)Covid-19 Tri-Sucrose Vaccine 12/24/2019, 01/14/2020, 06/30/2020   Pneumococcal Conjugate-13 09/04/2016   Pneumococcal Polysaccharide-23 09/05/2012, 10/21/2018   RSV,unspecified 10/15/2022   Zoster Recombinant(Shingrix) 06/19/2017, 10/26/2017    TDAP status: Due, Education has been provided regarding the importance of this vaccine. Advised may receive this vaccine at local pharmacy or Health Dept. Aware to provide a copy of the vaccination record if obtained from local pharmacy or Health Dept. Verbalized acceptance and understanding.  Flu Vaccine  status: Up to date  Pneumococcal vaccine status: Due, Education has been provided regarding the importance of this vaccine. Advised may receive this vaccine at local pharmacy or Health Dept. Aware to provide a copy of the vaccination record if obtained from local pharmacy or Health Dept. Verbalized acceptance and understanding.  Covid-19 vaccine status: Information provided on how to obtain vaccines.   Qualifies for Shingles Vaccine? Yes   Zostavax completed Yes   Shingrix Completed?: Yes  Screening Tests Health Maintenance  Topic Date Due   DTaP/Tdap/Td (1 - Tdap) Never done   COVID-19 Vaccine (4 - 2024-25 season) 06/03/2023   Pneumonia Vaccine 3+ Years old (3 of 3 - PCV20 or PCV21) 10/22/2023   INFLUENZA VACCINE  05/02/2024   Lung Cancer Screening  07/03/2024   MAMMOGRAM  12/11/2024   Medicare Annual Wellness (AWV)  01/16/2025   Colonoscopy  11/25/2033   DEXA SCAN  Completed   Hepatitis C Screening  Completed   Zoster Vaccines- Shingrix  Completed   HPV VACCINES  Aged Out   Meningococcal B Vaccine  Aged Out    Health Maintenance  Health Maintenance Due  Topic Date Due   DTaP/Tdap/Td (1 - Tdap) Never done  COVID-19 Vaccine (4 - 2024-25 season) 06/03/2023   Pneumonia Vaccine 75+ Years old (3 of 3 - PCV20 or PCV21) 10/22/2023    Colorectal cancer screening: Type of screening: Colonoscopy. Completed 2025. Repeat every 10 years  Mammogram   is scheduled  Bone Density status: Completed 2025. Results reflect: Bone density results: OSTEOPENIA. Repeat every 3-5 years.  Lung Cancer Screening: (Low Dose CT Chest recommended if Age 20-80 years, 20 pack-year currently smoking OR have quit w/in 15years.) does qualify.   Lung Cancer Screening Referral: patient is scheduled  Additional Screening:  Hepatitis C Screening: does not qualify; Completed 2017  Vision Screening: Recommended annual ophthalmology exams for early detection of glaucoma and other disorders of the eye. Is  the patient up to date with their annual eye exam?  Yes  Who is the provider or what is the name of the office in which the patient attends annual eye exams? Looking for a new one If pt is not established with a provider, would they like to be referred to a provider to establish care? No .   Dental Screening: Recommended annual dental exams for proper oral hygiene    Community Resource Referral / Chronic Care Management: CRR required this visit?  No   CCM required this visit?  No     Plan:     I have personally reviewed and noted the following in the patient's chart:   Medical and social history Use of alcohol, tobacco or illicit drugs  Current medications and supplements including opioid prescriptions. Patient is not currently taking opioid prescriptions. Functional ability and status Nutritional status Physical activity Advanced directives List of other physicians Hospitalizations, surgeries, and ER visits in previous 12 months Vitals Screenings to include cognitive, depression, and falls Referrals and appointments  In addition, I have reviewed and discussed with patient certain preventive protocols, quality metrics, and best practice recommendations. A written personalized care plan for preventive services as well as general preventive health recommendations were provided to patient.     Kieth Pelt, LPN   11/15/863   After Visit Summary: (MyChart) Due to this being a telephonic visit, the after visit summary with patients personalized plan was offered to patient via MyChart   Nurse Notes:

## 2024-01-17 NOTE — Patient Instructions (Signed)
 [name] , Thank you for taking time to come for your Medicare Wellness Visit. I appreciate your ongoing commitment to your health goals. Please review the following plan we discussed and let me know if I can assist you in the future.   Screening recommendations/referrals: Colonoscopy: up to date Mammogram: scheduled Bone Density: up to date Recommended yearly ophthalmology/optometry visit for glaucoma screening and checkup Recommended yearly dental visit for hygiene and checkup  Vaccinations: Influenza vaccine: up to date Pneumococcal vaccine: Education provided Tdap vaccine: Education provided Shingles vaccine: up to date      Preventive Care 65 Years and Older, Female Preventive care refers to lifestyle choices and visits with your health care provider that can promote health and wellness. What does preventive care include? A yearly physical exam. This is also called an annual well check. Dental exams once or twice a year. Routine eye exams. Ask your health care provider how often you should have your eyes checked. Personal lifestyle choices, including: Daily care of your teeth and gums. Regular physical activity. Eating a healthy diet. Avoiding tobacco and drug use. Limiting alcohol use. Practicing safe sex. Taking low-dose aspirin every day. Taking vitamin and mineral supplements as recommended by your health care provider. What happens during an annual well check? The services and screenings done by your health care provider during your annual well check will depend on your age, overall health, lifestyle risk factors, and family history of disease. Counseling  Your health care provider may ask you questions about your: Alcohol use. Tobacco use. Drug use. Emotional well-being. Home and relationship well-being. Sexual activity. Eating habits. History of falls. Memory and ability to understand (cognition). Work and work Astronomer. Reproductive health. Screening   You may have the following tests or measurements: Height, weight, and BMI. Blood pressure. Lipid and cholesterol levels. These may be checked every 5 years, or more frequently if you are over 6 years old. Skin check. Lung cancer screening. You may have this screening every year starting at age 68 if you have a 30-pack-year history of smoking and currently smoke or have quit within the past 15 years. Fecal occult blood test (FOBT) of the stool. You may have this test every year starting at age 43. Flexible sigmoidoscopy or colonoscopy. You may have a sigmoidoscopy every 5 years or a colonoscopy every 10 years starting at age 23. Hepatitis C blood test. Hepatitis B blood test. Sexually transmitted disease (STD) testing. Diabetes screening. This is done by checking your blood sugar (glucose) after you have not eaten for a while (fasting). You may have this done every 1-3 years. Bone density scan. This is done to screen for osteoporosis. You may have this done starting at age 35. Mammogram. This may be done every 1-2 years. Talk to your health care provider about how often you should have regular mammograms. Talk with your health care provider about your test results, treatment options, and if necessary, the need for more tests. Vaccines  Your health care provider may recommend certain vaccines, such as: Influenza vaccine. This is recommended every year. Tetanus, diphtheria, and acellular pertussis (Tdap, Td) vaccine. You may need a Td booster every 10 years. Zoster vaccine. You may need this after age 61. Pneumococcal 13-valent conjugate (PCV13) vaccine. One dose is recommended after age 61. Pneumococcal polysaccharide (PPSV23) vaccine. One dose is recommended after age 77. Talk to your health care provider about which screenings and vaccines you need and how often you need them. This information is not intended  to replace advice given to you by your health care provider. Make sure you discuss  any questions you have with your health care provider. Document Released: 10/15/2015 Document Revised: 06/07/2016 Document Reviewed: 07/20/2015 Elsevier Interactive Patient Education  2017 [employer].  Fall Prevention in the Home Falls can cause injuries. They can happen to people of all ages. There are many things you can do to make your home safe and to help prevent falls. What can I do on the outside of my home? Regularly fix the edges of walkways and driveways and fix any cracks. Remove anything that might make you trip as you walk through a door, such as a raised step or threshold. Trim any bushes or trees on the path to your home. Use bright outdoor lighting. Clear any walking paths of anything that might make someone trip, such as rocks or tools. Regularly check to see if handrails are loose or broken. Make sure that both sides of any steps have handrails. Any raised decks and porches should have guardrails on the edges. Have any leaves, snow, or ice cleared regularly. Use sand or salt on walking paths during winter. Clean up any spills in your garage right away. This includes oil or grease spills. What can I do in the bathroom? Use night lights. Install grab bars by the toilet and in the tub and shower. Do not use towel bars as grab bars. Use non-skid mats or decals in the tub or shower. If you need to sit down in the shower, use a plastic, non-slip stool. Keep the floor dry. Clean up any water that spills on the floor as soon as it happens. Remove soap buildup in the tub or shower regularly. Attach bath mats securely with double-sided non-slip rug tape. Do not have throw rugs and other things on the floor that can make you trip. What can I do in the bedroom? Use night lights. Make sure that you have a light by your bed that is easy to reach. Do not use any sheets or blankets that are too big for your bed. They should not hang down onto the floor. Have a firm chair that has  side arms. You can use this for support while you get dressed. Do not have throw rugs and other things on the floor that can make you trip. What can I do in the kitchen? Clean up any spills right away. Avoid walking on wet floors. Keep items that you use a lot in easy-to-reach places. If you need to reach something above you, use a strong step stool that has a grab bar. Keep electrical cords out of the way. Do not use floor polish or wax that makes floors slippery. If you must use wax, use non-skid floor wax. Do not have throw rugs and other things on the floor that can make you trip. What can I do with my stairs? Do not leave any items on the stairs. Make sure that there are handrails on both sides of the stairs and use them. Fix handrails that are broken or loose. Make sure that handrails are as long as the stairways. Check any carpeting to make sure that it is firmly attached to the stairs. Fix any carpet that is loose or worn. Avoid having throw rugs at the top or bottom of the stairs. If you do have throw rugs, attach them to the floor with carpet tape. Make sure that you have a light switch at the top of the stairs and  the bottom of the stairs. If you do not have them, ask someone to add them for you. What else can I do to help prevent falls? Wear shoes that: Do not have high heels. Have rubber bottoms. Are comfortable and fit you well. Are closed at the toe. Do not wear sandals. If you use a stepladder: Make sure that it is fully opened. Do not climb a closed stepladder. Make sure that both sides of the stepladder are locked into place. Ask someone to hold it for you, if possible. Clearly mark and make sure that you can see: Any grab bars or handrails. First and last steps. Where the edge of each step is. Use tools that help you move around (mobility aids) if they are needed. These include: Canes. Walkers. Scooters. Crutches. Turn on the lights when you go into a dark area.  Replace any light bulbs as soon as they burn out. Set up your furniture so you have a clear path. Avoid moving your furniture around. If any of your floors are uneven, fix them. If there are any pets around you, be aware of where they are. Review your medicines with your doctor. Some medicines can make you feel dizzy. This can increase your chance of falling. Ask your doctor what other things that you can do to help prevent falls. This information is not intended to replace advice given to you by your health care provider. Make sure you discuss any questions you have with your health care provider. Document Released: 07/15/2009 Document Revised: 02/24/2016 Document Reviewed: 10/23/2014 Elsevier Interactive Patient Education  2017 [employer].

## 2024-01-22 ENCOUNTER — Ambulatory Visit
Admission: RE | Admit: 2024-01-22 | Discharge: 2024-01-22 | Disposition: A | Discharge status: Home / Self Care | Payer: Medicare Other | Source: Ambulatory Visit | Attending: Adult Health | Admitting: Adult Health

## 2024-01-22 DIAGNOSIS — Z1231 Encounter for screening mammogram for malignant neoplasm of breast: Secondary | ICD-10-CM

## 2024-03-14 ENCOUNTER — Encounter: Payer: Self-pay | Admitting: Family Medicine

## 2024-03-14 ENCOUNTER — Other Ambulatory Visit: Payer: Self-pay | Admitting: Family Medicine

## 2024-03-14 MED ORDER — AMPHETAMINE-DEXTROAMPHETAMINE 10 MG PO TABS: 10.0000 mg | ORAL_TABLET | Freq: Two times a day (BID) | ORAL | 0 refills | Status: DC

## 2024-03-17 ENCOUNTER — Encounter: Payer: Self-pay | Admitting: Family Medicine

## 2024-03-17 ENCOUNTER — Other Ambulatory Visit: Payer: Self-pay

## 2024-03-17 MED ORDER — AMPHETAMINE-DEXTROAMPHETAMINE 10 MG PO TABS: 10.0000 mg | ORAL_TABLET | Freq: Two times a day (BID) | ORAL | 0 refills | Status: DC

## 2024-03-24 ENCOUNTER — Other Ambulatory Visit: Payer: Self-pay

## 2024-03-24 DIAGNOSIS — I6523 Occlusion and stenosis of bilateral carotid arteries: Secondary | ICD-10-CM

## 2024-04-07 ENCOUNTER — Ambulatory Visit (HOSPITAL_COMMUNITY)
Admission: RE | Admit: 2024-04-07 | Discharge: 2024-04-07 | Disposition: A | Discharge status: Home / Self Care | Source: Ambulatory Visit | Attending: Surgery | Admitting: Surgery

## 2024-04-07 ENCOUNTER — Ambulatory Visit: Admitting: Physician Assistant

## 2024-04-07 VITALS — BP 144/85 | HR 79 | Temp 97.9°F | Ht 63.0 in | Wt 123.3 lb

## 2024-04-07 DIAGNOSIS — I6523 Occlusion and stenosis of bilateral carotid arteries: Secondary | ICD-10-CM | POA: Insufficient documentation

## 2024-04-07 NOTE — Progress Notes (Signed)
 HISTORY AND PHYSICAL     CC:  follow up. Requesting Provider:  [name], MD  HPI: This is a 68 y.o. female here for follow up for carotid artery stenosis.  Pt is followed for carotid artery stenosis in setting of fibromuscular dysplasia.  She has a hx of syncopal episode in 2009 with a workup that was inconclusive for stroke or TIA.  Pt was last seen 03/26/2023 and at that time she was doing well without any neurological sx. She was not taking asa due to previous GIB issues.   Pt returns today for follow up.    Pt denies any amaurosis fugax, speech difficulties, weakness,  paralysis or clumsiness or facial droop.    She states she had some numbness in her left hand earlier today.  She has hx of 5 bulging discs in her back.  She denies any chest pain.  She is working on quitting smoking.    The pt is on a statin for cholesterol management.  The pt is not on a daily aspirin.   Other AC:  none The pt is not on medication for hypertension.   The pt is not on medication for diabetes Tobacco hx:  current  Pt does not have family hx of AAA.  Past Medical History:  Diagnosis Date   ADHD (attention deficit hyperactivity disorder) 09/07/2011   Anesthesia complication 09/20/2020   Will need GETA for future endoscopy procedures. Did not tolerate MAC well.   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   Diverticulosis    DVT (deep venous thrombosis) (HCC) 09/07/2011   Hypertension    on no medications   Hypothyroid 09/07/2011   Internal hemorrhoids    Iron deficiency anemia    Personal history of radiation therapy 2012   Pneumonia    Smoker 02/17/2013   Tubular adenoma of colon     Past Surgical History:  Procedure Laterality Date   ABDOMINAL HYSTERECTOMY     BIOPSY  12/04/2019   Procedure: BIOPSY;  Surgeon: Kristie Lamprey, MD;  Location: [facility];  Service: Endoscopy;;   BIOPSY  06/14/2021   Procedure: BIOPSY;  Surgeon: Wilhelmenia Aloha Raddle., MD;  Location: [facility];  Service: Gastroenterology;;   BREAST BIOPSY Left 2013   BREAST LUMPECTOMY  12/2011   Left breast   COLONOSCOPY     COLONOSCOPY WITH PROPOFOL  N/A 12/04/2019   Procedure: COLONOSCOPY WITH PROPOFOL ;  Surgeon: Kristie Lamprey, MD;  Location: [facility];  Service: Endoscopy;  Laterality: N/A;   COLONOSCOPY WITH PROPOFOL  N/A 09/20/2020   Procedure: COLONOSCOPY WITH PROPOFOL ;  Surgeon: Mansouraty, Aloha Raddle., MD;  Location: [facility] ENDOSCOPY;  Service: Gastroenterology;  Laterality: N/A;   COLONOSCOPY WITH PROPOFOL  N/A 06/14/2021   Procedure: COLONOSCOPY WITH PROPOFOL ;  Surgeon: Mansouraty, Aloha Raddle., MD;  Location: [facility];  Service: Gastroenterology;  Laterality: N/A;   COLONOSCOPY WITH PROPOFOL  N/A 11/26/2023   Procedure: COLONOSCOPY WITH PROPOFOL ;  Surgeon: Wilhelmenia Aloha Raddle., MD;  Location: [facility];  Service: Gastroenterology;  Laterality: N/A;   ENDOSCOPIC MUCOSAL RESECTION N/A 09/20/2020   Procedure: ENDOSCOPIC MUCOSAL RESECTION;  Surgeon: Wilhelmenia Aloha Raddle., MD;  Location: [facility] ENDOSCOPY;  Service: Gastroenterology;  Laterality: N/A;   ENDOSCOPIC MUCOSAL RESECTION N/A 11/26/2023   Procedure: ENDOSCOPIC MUCOSAL RESECTION;  Surgeon: Wilhelmenia Aloha Raddle., MD;  Location: [facility];  Service: Gastroenterology;  Laterality: N/A;   ENTEROSCOPY N/A 11/26/2023   Procedure: ENTEROSCOPY;  Surgeon: Wilhelmenia Aloha Raddle., MD;  Location: [facility];  Service: Gastroenterology;  Laterality: N/A;   ESOPHAGOGASTRODUODENOSCOPY (EGD) WITH PROPOFOL  N/A 12/04/2019   Procedure: ESOPHAGOGASTRODUODENOSCOPY (EGD) WITH PROPOFOL ;  Surgeon: Kristie Lamprey, MD;  Location: [facility];  Service: Endoscopy;  Laterality: N/A;   ESOPHAGOGASTRODUODENOSCOPY (EGD) WITH PROPOFOL  N/A 06/01/2022   Procedure: ESOPHAGOGASTRODUODENOSCOPY (EGD) WITH  PROPOFOL ;  Surgeon: Wilhelmenia Aloha Raddle., MD;  Location: [facility];  Service: Gastroenterology;  Laterality: N/A;   EYE SURGERY Right 06/2013   Lasix - Laser   GIVENS CAPSULE STUDY N/A 06/01/2022   Procedure: GIVENS CAPSULE STUDY;  Surgeon: Wilhelmenia Aloha Raddle., MD;  Location: [facility];  Service: Gastroenterology;  Laterality: N/A;   HEMOSTASIS CLIP PLACEMENT  09/20/2020   Procedure: HEMOSTASIS CLIP PLACEMENT;  Surgeon: Wilhelmenia Aloha Raddle., MD;  Location: [facility] ENDOSCOPY;  Service: Gastroenterology;;   HOT HEMOSTASIS N/A 06/01/2022   Procedure: HOT HEMOSTASIS (ARGON PLASMA COAGULATION/BICAP);  Surgeon: Wilhelmenia Aloha Raddle., MD;  Location: [facility];  Service: Gastroenterology;  Laterality: N/A;   HOT HEMOSTASIS N/A 11/26/2023   Procedure: HOT HEMOSTASIS (ARGON PLASMA COAGULATION/BICAP);  Surgeon: Wilhelmenia Aloha Raddle., MD;  Location: [facility];  Service: Gastroenterology;  Laterality: N/A;   POLYPECTOMY  12/04/2019   Procedure: POLYPECTOMY;  Surgeon: Kristie Lamprey, MD;  Location: [facility];  Service: Endoscopy;;   POLYPECTOMY  09/20/2020   Procedure: POLYPECTOMY;  Surgeon: Wilhelmenia Aloha Raddle., MD;  Location: [facility] ENDOSCOPY;  Service: Gastroenterology;;   POLYPECTOMY  06/14/2021   Procedure: POLYPECTOMY;  Surgeon: Wilhelmenia Aloha Raddle., MD;  Location: [facility];  Service: Gastroenterology;;   POLYPECTOMY  11/26/2023   Procedure: POLYPECTOMY;  Surgeon: Wilhelmenia Aloha Raddle., MD;  Location: [facility];  Service: Gastroenterology;;   SUBMUCOSAL LIFTING INJECTION  09/20/2020   Procedure: SUBMUCOSAL LIFTING INJECTION;  Surgeon: Wilhelmenia Aloha Raddle., MD;  Location: [facility]COPY;  Service: Gastroenterology;;   ROBLEY LIFTING INJECTION  11/26/2023   Procedure: SUBMUCOSAL LIFTING INJECTION;  Surgeon: Wilhelmenia Aloha Raddle., MD;  Location: [facility];  Service: Gastroenterology;;   SUBMUCOSAL TATTOO INJECTION  11/26/2023   Procedure: SUBMUCOSAL TATTOO INJECTION;   Surgeon: Wilhelmenia Aloha Raddle., MD;  Location: [facility];  Service: Gastroenterology;;   UPPER GASTROINTESTINAL ENDOSCOPY      Allergies  Allergen Reactions   Penicillins Hives    Did it involve swelling of the face/tongue/throat, SOB, or low BP? No Did it involve sudden or severe rash/hives, skin peeling, or any reaction on the inside of your mouth or nose? No Did you need to seek medical attention at a hospital or doctor's office? Yes When did it last happen?      35 years If all above answers are "NO", may proceed with cephalosporin use.    Current Outpatient Medications  Medication Sig Dispense Refill   acetaminophen  (TYLENOL ) 650 MG CR tablet Take 650 mg by mouth every 8 (eight) hours as needed for pain.     amphetamine -dextroamphetamine  (ADDERALL) 10 MG tablet Take 1 tablet (10 mg total) by mouth 2 (two) times daily. 180 tablet 0   B Complex-C (B-COMPLEX WITH VITAMIN C) tablet Take 1 tablet by mouth daily.     bisacodyl (DULCOLAX) 5 MG EC tablet Take 5 mg by mouth in the morning.     cholecalciferol (VITAMIN D ) 1000 UNITS tablet Take 1,000 Units by mouth daily.     esomeprazole  (NEXIUM ) 40 MG capsule Take 1 capsule (40 mg total) by mouth daily.  90 capsule 3   ferrous sulfate 325 (65 FE) MG EC tablet Take 325 mg by mouth 1 day or 1 dose.     levothyroxine  (SYNTHROID ) 75 MCG tablet TAKE 1 TABLET DAILY BEFORE BREAKFAST 90 tablet 3   Multiple Vitamin (MULTIVITAMIN WITH MINERALS) TABS tablet Take 1 tablet by mouth daily. Centrum Silver     naproxen  (NAPROSYN ) 375 MG tablet Take 1 tablet (375 mg total) by mouth 2 (two) times daily with a meal. 180 tablet 0   polycarbophil (FIBERCON) 625 MG tablet Take 625 mg by mouth daily.     rosuvastatin  (CRESTOR ) 20 MG tablet TAKE 1 TABLET BY MOUTH EVERY DAY 90 tablet 1   Varenicline  Tartrate, Starter, (CHANTIX  STARTING MONTH PAK) 0.5 MG X 11 & 1 MG X 42 TBPK Take 0.5 mg tablet by mouth 1x daily for 3 days, then increase to 0.5 mg tablet 2x  daily for 4 days, then increase to 1 mg tablet 2x daily. 1 each 0   No current facility-administered medications for this visit.    Family History  Problem Relation Age of Onset   Stomach cancer Father    Esophageal cancer Father    Crohn's disease Daughter    Ulcerative colitis Daughter    Diabetes Maternal Grandmother    Colon cancer Neg Hx    Pancreatic cancer Neg Hx    Liver disease Neg Hx    Inflammatory bowel disease Neg Hx    Rectal cancer Neg Hx    Breast cancer Neg Hx     Social History   Socioeconomic History   Marital status: Divorced    Spouse name: Not on file   Number of children: Not on file   Years of education: Not on file   Highest education level: Associate degree: academic program  Occupational History   Not on file  Tobacco Use   Smoking status: Every Day    Current packs/day: 0.50    Average packs/day: 0.5 packs/day for 40.0 years (20.0 ttl pk-yrs)    Types: Cigarettes   Smokeless tobacco: Never  Vaping Use   Vaping status: Never Used  Substance and Sexual Activity   Alcohol use: Yes    Alcohol/week: 0.0 standard drinks of alcohol    Comment: 4 mixed drinks per week    Drug use: No   Sexual activity: Never  Other Topics Concern   Not on file  Social History Narrative   Not on file   Social Drivers of Health   Financial Resource Strain: Low Risk  (01/17/2024)   Overall Financial Resource Strain (CARDIA)    Difficulty of Paying Living Expenses: Not hard at all  Food Insecurity: No Food Insecurity (01/17/2024)   Hunger Vital Sign    Worried About Running Out of Food in the Last Year: Never true    Ran Out of Food in the Last Year: Never true  Transportation Needs: No Transportation Needs (01/17/2024)   PRAPARE - Administrator, Civil Service (Medical): No    Lack of Transportation (Non-Medical): No  Physical Activity: Insufficiently Active (01/17/2024)   Exercise Vital Sign    Days of Exercise per Week: 3 days    Minutes of  Exercise per Session: 30 min  Stress: No Stress Concern Present (01/17/2024)   [employer] of Occupational Health - Occupational Stress Questionnaire    Feeling of Stress : Not at all  Social Connections: Moderately Integrated (01/17/2024)   Social Connection and Isolation Panel  Frequency of Communication with Friends and Family: Three times a week    Frequency of Social Gatherings with Friends and Family: Twice a week    Attends Religious Services: More than 4 times per year    Active Member of [employer] or Organizations: No    Attends Engineer, structural: More than 4 times per year    Marital Status: Divorced  Recent Concern: Social Connections - Moderately Isolated (12/18/2023)   Social Connection and Isolation Panel    Frequency of Communication with Friends and Family: Three times a week    Frequency of Social Gatherings with Friends and Family: Twice a week    Attends Religious Services: More than 4 times per year    Active Member of [employer] or Organizations: No    Attends Engineer, structural: Not on file    Marital Status: Divorced  Intimate Partner Violence: Not At Risk (01/17/2024)   Humiliation, Afraid, Rape, and Kick questionnaire    Fear of Current or Ex-Partner: No    Emotionally Abused: No    Physically Abused: No    Sexually Abused: No     REVIEW OF SYSTEMS:   [X]  denotes positive finding, [ ]  denotes negative finding Cardiac  Comments:  Chest pain or chest pressure:    Shortness of breath upon exertion:    Short of breath when lying flat:    Irregular heart rhythm:        Vascular    Pain in calf, thigh, or hip brought on by ambulation:    Pain in feet at night that wakes you up from your sleep:     Blood clot in your veins:    Leg swelling:         Pulmonary    Oxygen at home:    Productive cough:     Wheezing:         Neurologic    Sudden weakness in arms or legs:     Sudden numbness in arms or legs:     Sudden onset of  difficulty speaking or slurred speech:    Temporary loss of vision in one eye:     Problems with dizziness:         Gastrointestinal    Blood in stool:     Vomited blood:         Genitourinary    Burning when urinating:     Blood in urine:        Psychiatric    Major depression:         Hematologic    Bleeding problems:    Problems with blood clotting too easily:        Skin    Rashes or ulcers:        Constitutional    Fever or chills:      PHYSICAL EXAMINATION:  Today's Vitals   04/07/24 1306 04/07/24 1308  BP: (!) 150/91 (!) 144/85  Pulse: 79   Temp: 97.9 F (36.6 C)   TempSrc: Temporal   SpO2: 90%   Weight: 123 lb 4.8 oz (55.9 kg)   Height: 5' 3 (1.6 m)   PainSc: 0-No pain    Body mass index is 21.84 kg/m.   General:  WDWN in NAD; vital signs documented above Gait: Not observed HENT: WNL, normocephalic Pulmonary: normal non-labored breathing Cardiac: regular HR, without carotid bruits Abdomen: soft, NT; aortic pulse is not palpable Skin: without rashes Vascular Exam/Pulses:  Right Left  Radial 2+ (normal)  2+ (normal)  DP 2+ (normal) 2+ (normal)  PT 2+ (normal) 2+ (normal)   Extremities: without open wounds Musculoskeletal: no muscle wasting or atrophy  Neurologic: A&O X 3; moving all extremities equally; speech is fluent/normal Psychiatric:  The pt has Normal affect.   Non-Invasive Vascular Imaging:   Carotid Duplex on 04/07/2024 Right:  normal Left:  normal Vertebrals:  Bilateral vertebral arteries demonstrate antegrade flow.  Subclavians: Normal flow hemodynamics were seen in bilateral subclavian arteries.   Previous Carotid duplex on 03/26/2023: Right: normal Left:   normal    ASSESSMENT/PLAN:: 68 y.o. female here for follow up carotid artery stenosis and has hx of carotid artery stenosis in setting of fibromuscular dysplasia.  She has a hx of syncopal episode in 2009 with a workup that was inconclusive for stroke or TIA.   -duplex  today reveals no stenosis bilateral ICA stenosis.  -she did have some numbness in the left hand this morning.  She has a hx of bulging discs in her back.  Discussed if this happens again, she really needs to have it evaluated.  -discussed s/s of stroke with pt and she understands should she develop any of these sx, she will go to the nearest ER or call 911. -pt will f/u in one year with carotid duplex.  If normal next year and not smoking, can push out to 2 year follow up.   -pt will call sooner should she have any issues. -continue statin.  She is not on asa due to GIB issues in the past.    Lucie Apt, [facility] [phone]  Clinic MD:  Serene

## 2024-04-11 ENCOUNTER — Other Ambulatory Visit: Payer: Self-pay

## 2024-04-11 ENCOUNTER — Telehealth: Payer: Self-pay | Admitting: Family Medicine

## 2024-04-11 DIAGNOSIS — I2510 Atherosclerotic heart disease of native coronary artery without angina pectoris: Secondary | ICD-10-CM

## 2024-04-11 MED ORDER — ROSUVASTATIN CALCIUM 20 MG PO TABS: 20.0000 mg | ORAL_TABLET | Freq: Every day | ORAL | 1 refills | Status: DC

## 2024-04-11 NOTE — Telephone Encounter (Signed)
 Prescription Request  04/11/2024  LOV: 12/20/2023  What is the name of the medication or equipment?   rosuvastatin  (CRESTOR ) 20 MG tablet  **90 day script requested**  Have you contacted your pharmacy to request a refill? Yes   Which pharmacy would you like this sent to?  [id number] [address], [place] - [address] [place] [address] Phone: [phone] Fax: [phone]    Patient notified that their request is being sent to the clinical staff for review and that they should receive a response within 2 business days.   Please advise pharmacist.

## 2024-04-11 NOTE — Telephone Encounter (Signed)
 Sent in medication

## 2024-06-19 ENCOUNTER — Other Ambulatory Visit: Payer: Self-pay | Admitting: Family Medicine

## 2024-06-19 ENCOUNTER — Encounter: Payer: Self-pay | Admitting: Family Medicine

## 2024-06-19 DIAGNOSIS — I2510 Atherosclerotic heart disease of native coronary artery without angina pectoris: Secondary | ICD-10-CM

## 2024-06-19 MED ORDER — ROSUVASTATIN CALCIUM 20 MG PO TABS: 20.0000 mg | ORAL_TABLET | Freq: Every day | ORAL | 1 refills | Status: AC

## 2024-06-19 MED ORDER — AMPHETAMINE-DEXTROAMPHETAMINE 10 MG PO TABS: 10.0000 mg | ORAL_TABLET | Freq: Two times a day (BID) | ORAL | 0 refills | Status: DC

## 2024-07-08 ENCOUNTER — Ambulatory Visit (HOSPITAL_COMMUNITY)
Admission: RE | Admit: 2024-07-08 | Discharge: 2024-07-08 | Disposition: A | Discharge status: Home / Self Care | Source: Ambulatory Visit | Attending: Adult Health | Admitting: Adult Health

## 2024-07-08 DIAGNOSIS — Z122 Encounter for screening for malignant neoplasm of respiratory organs: Secondary | ICD-10-CM | POA: Diagnosis present

## 2024-07-08 DIAGNOSIS — I700 Atherosclerosis of aorta: Secondary | ICD-10-CM | POA: Diagnosis not present

## 2024-07-08 DIAGNOSIS — F17200 Nicotine dependence, unspecified, uncomplicated: Secondary | ICD-10-CM | POA: Insufficient documentation

## 2024-07-08 DIAGNOSIS — F17210 Nicotine dependence, cigarettes, uncomplicated: Secondary | ICD-10-CM | POA: Diagnosis present

## 2024-07-08 DIAGNOSIS — J439 Emphysema, unspecified: Secondary | ICD-10-CM | POA: Diagnosis not present

## 2024-07-08 DIAGNOSIS — I2510 Atherosclerotic heart disease of native coronary artery without angina pectoris: Secondary | ICD-10-CM | POA: Diagnosis not present

## 2024-07-10 ENCOUNTER — Other Ambulatory Visit

## 2024-07-10 DIAGNOSIS — E039 Hypothyroidism, unspecified: Secondary | ICD-10-CM

## 2024-07-10 DIAGNOSIS — D500 Iron deficiency anemia secondary to blood loss (chronic): Secondary | ICD-10-CM

## 2024-07-10 DIAGNOSIS — F17200 Nicotine dependence, unspecified, uncomplicated: Secondary | ICD-10-CM

## 2024-07-10 DIAGNOSIS — I2510 Atherosclerotic heart disease of native coronary artery without angina pectoris: Secondary | ICD-10-CM

## 2024-07-11 ENCOUNTER — Ambulatory Visit: Payer: Self-pay | Admitting: Family Medicine

## 2024-07-11 LAB — COMPLETE METABOLIC PANEL WITHOUT GFR
AG Ratio: 1.8 (calc) (ref 1.0–2.5)
ALT: 20 U/L (ref 6–29)
AST: 25 U/L (ref 10–35)
Albumin: 4.2 g/dL (ref 3.6–5.1)
Alkaline phosphatase (APISO): 72 U/L (ref 37–153)
BUN: 18 mg/dL (ref 7–25)
CO2: 25 mmol/L (ref 20–32)
Calcium: 9.5 mg/dL (ref 8.6–10.4)
Chloride: 101 mmol/L (ref 98–110)
Creat: 0.69 mg/dL (ref 0.50–1.05)
Globulin: 2.3 g/dL (ref 1.9–3.7)
Glucose, Bld: 74 mg/dL (ref 65–99)
Potassium: 4.9 mmol/L (ref 3.5–5.3)
Sodium: 136 mmol/L (ref 135–146)
Total Bilirubin: 0.3 mg/dL (ref 0.2–1.2)
Total Protein: 6.5 g/dL (ref 6.1–8.1)

## 2024-07-11 LAB — CBC WITH DIFFERENTIAL/PLATELET
Absolute Lymphocytes: 1017 {cells}/uL (ref 850–3900)
Absolute Monocytes: 676 {cells}/uL (ref 200–950)
Basophils Absolute: 81 {cells}/uL (ref 0–200)
Basophils Relative: 1.3 %
Eosinophils Absolute: 577 {cells}/uL — ABNORMAL HIGH (ref 15–500)
Eosinophils Relative: 9.3 %
HCT: 25.2 % — ABNORMAL LOW (ref 35.0–45.0)
Hemoglobin: 7.3 g/dL — ABNORMAL LOW (ref 11.7–15.5)
MCH: 22.8 pg — ABNORMAL LOW (ref 27.0–33.0)
MCHC: 29 g/dL — ABNORMAL LOW (ref 32.0–36.0)
MCV: 78.8 fL — ABNORMAL LOW (ref 80.0–100.0)
MPV: 9.5 fL (ref 7.5–12.5)
Monocytes Relative: 10.9 %
Neutro Abs: 3850 {cells}/uL (ref 1500–7800)
Neutrophils Relative %: 62.1 %
Platelets: 791 Thousand/uL — ABNORMAL HIGH (ref 140–400)
RBC: 3.2 Million/uL — ABNORMAL LOW (ref 3.80–5.10)
RDW: 13.6 % (ref 11.0–15.0)
Total Lymphocyte: 16.4 %
WBC: 6.2 Thousand/uL (ref 3.8–10.8)

## 2024-07-11 LAB — SPECIMEN COMPROMISED

## 2024-07-11 LAB — LIPID PANEL
Cholesterol: 129 mg/dL (ref <200)
HDL: 79 mg/dL (ref >=50)
LDL Cholesterol (Calc): 38 mg/dL
Non-HDL Cholesterol (Calc): 50 mg/dL (ref <130)
Total CHOL/HDL Ratio: 1.6 (calc) (ref <5.0)
Triglycerides: 50 mg/dL (ref <150)

## 2024-07-11 LAB — TSH: TSH: 1.78 m[IU]/L (ref 0.40–4.50)

## 2024-07-14 ENCOUNTER — Other Ambulatory Visit: Payer: Self-pay

## 2024-07-14 DIAGNOSIS — D582 Other hemoglobinopathies: Secondary | ICD-10-CM

## 2024-07-15 ENCOUNTER — Telehealth: Payer: Self-pay | Admitting: Family Medicine

## 2024-07-15 ENCOUNTER — Telehealth: Payer: Self-pay | Admitting: Gastroenterology

## 2024-07-15 NOTE — Telephone Encounter (Signed)
 Can you please call the pt and give her the sooner appt date ? 10/27 at 130 pm with Camie Thank you

## 2024-07-15 NOTE — Telephone Encounter (Signed)
 Copied from CRM [id number]. Topic: General - Other >> Jul 14, 2024  4:45 PM Zebedee SAUNDERS wrote: Reason for CRM: Pt returning [facility] call, pt wants a call [phone] back has some questions.

## 2024-07-15 NOTE — Telephone Encounter (Signed)
 Inbound call from patient stating she was advised by her PCP that she has low hemoglobin. Patient is scheduled for 12/2. States she does no think she should wait that long to be seen. Patient is requesting a call back. Please advise, thank you

## 2024-07-15 NOTE — Telephone Encounter (Signed)
 Dr Wilhelmenia can you please review this pt Hgb is 7.3 and the soonest appt available is 10/27.  Is this ok?

## 2024-07-15 NOTE — Progress Notes (Signed)
 Got pt. On the phone informed pt. She needs to start iron she said she already has. She also was informed that she needs to call Gastro and schedule today and to please let us  know when her appt is so that we can follow up and request records. Pt. Stated she would call today.

## 2024-07-16 ENCOUNTER — Other Ambulatory Visit: Payer: Self-pay

## 2024-07-16 DIAGNOSIS — Z862 Personal history of diseases of the blood and blood-forming organs and certain disorders involving the immune mechanism: Secondary | ICD-10-CM

## 2024-07-16 DIAGNOSIS — K5520 Angiodysplasia of colon without hemorrhage: Secondary | ICD-10-CM

## 2024-07-16 NOTE — Telephone Encounter (Signed)
 She needs IV Iron to be given. I believe that she has seen Hematology in the past, please confirm and get her back ASAP. Let her repeat CBC on Monday, if <7 will need blood transfusion. I see she has appointment on 10/27 with [facility]. That is okay. 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 Thanks. GM

## 2024-07-16 NOTE — Telephone Encounter (Signed)
 Spoke to the pt and made her aware to keep the appt with Camie as planned Enteroscopy has been set up for 08/25/24 at 2 pm at [facility] with GM (all information has been sent to My Chart and mailed) She will come in for labs on Monday (order has been entered) Referral (urgent) sent to hematology- the pt will reach out if she has not heard from them in a few days.   All information have been discussed and no questions at this time

## 2024-07-18 ENCOUNTER — Telehealth: Payer: Self-pay | Admitting: Gastroenterology

## 2024-07-18 ENCOUNTER — Telehealth: Payer: Self-pay

## 2024-07-18 ENCOUNTER — Other Ambulatory Visit: Payer: Self-pay

## 2024-07-18 DIAGNOSIS — D509 Iron deficiency anemia, unspecified: Secondary | ICD-10-CM

## 2024-07-18 NOTE — Telephone Encounter (Signed)
 I spoke to the pt and she confirms that she will be able to get an appt with hematology on Monday. She will also repeat labs as well. Order has been entered

## 2024-07-18 NOTE — Telephone Encounter (Signed)
 Spoke with patient to provide guidance on next steps. Patient requested that another provider's office collect a CBC with differential; Dr. Man entered his own order for CBC w/ diff. Patient also inquired about receiving an iron infusion here with NP Morna, but her last visit was in 2023 and the authorization has since expired. Encouraged patient to reach out to the nurse at [facility] for further assistance regarding iron infusion coordination.

## 2024-07-18 NOTE — Telephone Encounter (Signed)
 Inbound call from patient stating she would like to speak to nurse Patty in regards to her iron infusions. Requesting a call back Please advise  Thank you

## 2024-07-19 ENCOUNTER — Other Ambulatory Visit: Payer: Self-pay | Admitting: Gastroenterology

## 2024-07-21 ENCOUNTER — Other Ambulatory Visit: Payer: Self-pay | Admitting: Adult Health

## 2024-07-21 ENCOUNTER — Encounter: Payer: Self-pay | Admitting: Adult Health

## 2024-07-21 ENCOUNTER — Inpatient Hospital Stay: Attending: Adult Health | Admitting: Adult Health

## 2024-07-21 ENCOUNTER — Other Ambulatory Visit: Payer: Self-pay

## 2024-07-21 ENCOUNTER — Inpatient Hospital Stay

## 2024-07-21 VITALS — BP 160/64 | HR 72 | Temp 97.9°F | Resp 19 | Ht 63.0 in | Wt 126.6 lb

## 2024-07-21 DIAGNOSIS — Z923 Personal history of irradiation: Secondary | ICD-10-CM | POA: Diagnosis not present

## 2024-07-21 DIAGNOSIS — D509 Iron deficiency anemia, unspecified: Secondary | ICD-10-CM | POA: Diagnosis present

## 2024-07-21 DIAGNOSIS — C50412 Malignant neoplasm of upper-outer quadrant of left female breast: Secondary | ICD-10-CM | POA: Diagnosis not present

## 2024-07-21 DIAGNOSIS — Z862 Personal history of diseases of the blood and blood-forming organs and certain disorders involving the immune mechanism: Secondary | ICD-10-CM

## 2024-07-21 DIAGNOSIS — D500 Iron deficiency anemia secondary to blood loss (chronic): Secondary | ICD-10-CM

## 2024-07-21 DIAGNOSIS — Z853 Personal history of malignant neoplasm of breast: Secondary | ICD-10-CM | POA: Diagnosis present

## 2024-07-21 LAB — CBC WITH DIFFERENTIAL (CANCER CENTER ONLY)
Abs Immature Granulocytes: 0.02 K/uL (ref 0.00–0.07)
Basophils Absolute: 0.1 K/uL (ref 0.0–0.1)
Basophils Relative: 1 %
Eosinophils Absolute: 0.5 K/uL (ref 0.0–0.5)
Eosinophils Relative: 8 %
HCT: 28.8 % — ABNORMAL LOW (ref 36.0–46.0)
Hemoglobin: 9 g/dL — ABNORMAL LOW (ref 12.0–15.0)
Immature Granulocytes: 0 %
Lymphocytes Relative: 15 %
Lymphs Abs: 1 K/uL (ref 0.7–4.0)
MCH: 24.3 pg — ABNORMAL LOW (ref 26.0–34.0)
MCHC: 31.3 g/dL (ref 30.0–36.0)
MCV: 77.8 fL — ABNORMAL LOW (ref 80.0–100.0)
Monocytes Absolute: 0.5 K/uL (ref 0.1–1.0)
Monocytes Relative: 8 %
Neutro Abs: 4.5 K/uL (ref 1.7–7.7)
Neutrophils Relative %: 68 %
Platelet Count: 722 K/uL — ABNORMAL HIGH (ref 150–400)
RBC: 3.7 MIL/uL — ABNORMAL LOW (ref 3.87–5.11)
RDW: 21.4 % — ABNORMAL HIGH (ref 11.5–15.5)
WBC Count: 6.5 K/uL (ref 4.0–10.5)
nRBC: 0 % (ref 0.0–0.2)

## 2024-07-21 LAB — CMP (CANCER CENTER ONLY)
ALT: 26 U/L (ref 0–44)
AST: 30 U/L (ref 15–41)
Albumin: 4.5 g/dL (ref 3.5–5.0)
Alkaline Phosphatase: 88 U/L (ref 38–126)
Anion gap: 8 (ref 5–15)
BUN: 17 mg/dL (ref 8–23)
CO2: 27 mmol/L (ref 22–32)
Calcium: 9.9 mg/dL (ref 8.9–10.3)
Chloride: 101 mmol/L (ref 98–111)
Creatinine: 0.64 mg/dL (ref 0.44–1.00)
GFR, Estimated: >60 mL/min (ref >60)
Glucose, Bld: 100 mg/dL — ABNORMAL HIGH (ref 70–99)
Potassium: 4 mmol/L (ref 3.5–5.1)
Sodium: 136 mmol/L (ref 135–145)
Total Bilirubin: 0.3 mg/dL (ref 0.0–1.2)
Total Protein: 7.4 g/dL (ref 6.5–8.1)

## 2024-07-21 LAB — VITAMIN B12: Vitamin B-12: 984 pg/mL — ABNORMAL HIGH (ref 180–914)

## 2024-07-21 LAB — IRON AND IRON BINDING CAPACITY (CC-WL,HP ONLY)
Iron: 486 ug/dL — ABNORMAL HIGH (ref 28–170)
Saturation Ratios: 86 % — ABNORMAL HIGH (ref 10.4–31.8)
TIBC: 568 ug/dL — ABNORMAL HIGH (ref 250–450)
UIBC: 82 ug/dL — ABNORMAL LOW (ref 148–442)

## 2024-07-21 LAB — RETIC PANEL
Immature Retic Fract: 36.6 % — ABNORMAL HIGH (ref 2.3–15.9)
RBC.: 3.68 MIL/uL — ABNORMAL LOW (ref 3.87–5.11)
Retic Count, Absolute: 151.6 K/uL (ref 19.0–186.0)
Retic Ct Pct: 4.1 % — ABNORMAL HIGH (ref 0.4–3.1)
Reticulocyte Hemoglobin: 32.4 pg (ref >27.9)

## 2024-07-21 LAB — FERRITIN: Ferritin: 23 ng/mL (ref 11–307)

## 2024-07-21 NOTE — Progress Notes (Signed)
 [facility] Cancer Follow up:    [name], MD [address] [place] [address]   DIAGNOSIS: Cancer Staging  No matching staging information was found for the patient.    SUMMARY OF ONCOLOGIC HISTORY: Oncology History  Breast cancer of upper-outer quadrant of left female breast (HCC)  01/30/2007 Surgery   Left breast lumpectomy: High-grade DCIS with focal necrosis 1.7 cm ER 54%, PR 74%   03/26/2007 - 05/13/2007 Radiation Therapy   Adjuvant radiation therapy   06/05/2007 - 06/05/2012 Anti-estrogen oral therapy   Arimidex 1 mg daily     CURRENT THERAPY: observation  INTERVAL HISTORY:  Discussed the use of AI scribe software for clinical note transcription with the patient, who gave verbal consent to proceed.  History of Present Illness [name] is a 68 year old female with iron deficiency anemia who presents for follow-up. She was referred to GI for further evaluation.  She has experienced fluctuations in her iron levels, with hemoglobin as low as 7.3 on October 9th, improving to 9 after resuming oral iron supplementation. She received an iron infusion with Clydene in February 2023, which was more effective than oral supplementation.  Her medical history includes left breast cancer diagnosed in 2008, treated with lumpectomy, adjuvant radiation, and antiestrogen therapy with anastrozole for five years, completed in 2013.  She continues to smoke about half a pack a day, with a tobacco history of at least 20 pack years. Lung cancer screening was unremarkable.  There is no visible blood in her stool or black tarry stools, but stool samples have previously returned positive for blood. There was a previous suspicion of a small leak in her small intestine, but the procedure to confirm this was deemed risky and only possible at [facility].     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   Chronic idiopathic constipation 11/03/2021   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  MEDICAL HISTORY: Past Medical History:  Diagnosis Date   ADHD (attention deficit hyperactivity disorder) 09/07/2011   Anesthesia complication 09/20/2020   Will need GETA for future endoscopy procedures. Did not tolerate MAC well.   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   Diverticulosis  DVT (deep venous thrombosis) (HCC) 09/07/2011   Hypertension    on no medications   Hypothyroid 09/07/2011   Internal hemorrhoids    Iron deficiency anemia    Personal history of radiation therapy 2012   Pneumonia    Smoker 02/17/2013   Tubular adenoma of colon     SURGICAL  HISTORY: Past Surgical History:  Procedure Laterality Date   ABDOMINAL HYSTERECTOMY     BIOPSY  12/04/2019   Procedure: BIOPSY;  Surgeon: Kristie Lamprey, MD;  Location: [facility];  Service: Endoscopy;;   BIOPSY  06/14/2021   Procedure: BIOPSY;  Surgeon: Wilhelmenia Aloha Raddle., MD;  Location: [facility];  Service: Gastroenterology;;   BREAST BIOPSY Left 2013   BREAST LUMPECTOMY  12/2011   Left breast   COLONOSCOPY     COLONOSCOPY WITH PROPOFOL  N/A 12/04/2019   Procedure: COLONOSCOPY WITH PROPOFOL ;  Surgeon: Kristie Lamprey, MD;  Location: [facility];  Service: Endoscopy;  Laterality: N/A;   COLONOSCOPY WITH PROPOFOL  N/A 09/20/2020   Procedure: COLONOSCOPY WITH PROPOFOL ;  Surgeon: Mansouraty, Aloha Raddle., MD;  Location: [facility] ENDOSCOPY;  Service: Gastroenterology;  Laterality: N/A;   COLONOSCOPY WITH PROPOFOL  N/A 06/14/2021   Procedure: COLONOSCOPY WITH PROPOFOL ;  Surgeon: Mansouraty, Aloha Raddle., MD;  Location: [facility];  Service: Gastroenterology;  Laterality: N/A;   COLONOSCOPY WITH PROPOFOL  N/A 11/26/2023   Procedure: COLONOSCOPY WITH PROPOFOL ;  Surgeon: Wilhelmenia Aloha Raddle., MD;  Location: [facility];  Service: Gastroenterology;  Laterality: N/A;   ENDOSCOPIC MUCOSAL RESECTION N/A 09/20/2020   Procedure: ENDOSCOPIC MUCOSAL RESECTION;  Surgeon: Wilhelmenia Aloha Raddle., MD;  Location: [facility] ENDOSCOPY;  Service: Gastroenterology;  Laterality: N/A;   ENDOSCOPIC MUCOSAL RESECTION N/A 11/26/2023   Procedure: ENDOSCOPIC MUCOSAL RESECTION;  Surgeon: Wilhelmenia Aloha Raddle., MD;  Location: [facility];  Service: Gastroenterology;  Laterality: N/A;   ENTEROSCOPY N/A 11/26/2023   Procedure: ENTEROSCOPY;  Surgeon: Wilhelmenia Aloha Raddle., MD;  Location: [facility];  Service: Gastroenterology;  Laterality: N/A;   ESOPHAGOGASTRODUODENOSCOPY (EGD) WITH PROPOFOL  N/A 12/04/2019   Procedure: ESOPHAGOGASTRODUODENOSCOPY (EGD) WITH PROPOFOL ;  Surgeon: Kristie Lamprey, MD;  Location: [facility];   Service: Endoscopy;  Laterality: N/A;   ESOPHAGOGASTRODUODENOSCOPY (EGD) WITH PROPOFOL  N/A 06/01/2022   Procedure: ESOPHAGOGASTRODUODENOSCOPY (EGD) WITH PROPOFOL ;  Surgeon: Wilhelmenia Aloha Raddle., MD;  Location: [facility];  Service: Gastroenterology;  Laterality: N/A;   EYE SURGERY Right 06/2013   Lasix - Laser   GIVENS CAPSULE STUDY N/A 06/01/2022   Procedure: GIVENS CAPSULE STUDY;  Surgeon: Wilhelmenia Aloha Raddle., MD;  Location: [facility];  Service: Gastroenterology;  Laterality: N/A;   HEMOSTASIS CLIP PLACEMENT  09/20/2020   Procedure: HEMOSTASIS CLIP PLACEMENT;  Surgeon: Wilhelmenia Aloha Raddle., MD;  Location: [facility] ENDOSCOPY;  Service: Gastroenterology;;   HOT HEMOSTASIS N/A 06/01/2022   Procedure: HOT HEMOSTASIS (ARGON PLASMA COAGULATION/BICAP);  Surgeon: Wilhelmenia Aloha Raddle., MD;  Location: [facility];  Service: Gastroenterology;  Laterality: N/A;   HOT HEMOSTASIS N/A 11/26/2023   Procedure: HOT HEMOSTASIS (ARGON PLASMA COAGULATION/BICAP);  Surgeon: Wilhelmenia Aloha Raddle., MD;  Location: [facility];  Service: Gastroenterology;  Laterality: N/A;   POLYPECTOMY  12/04/2019   Procedure: POLYPECTOMY;  Surgeon: Kristie Lamprey, MD;  Location: [facility];  Service: Endoscopy;;   POLYPECTOMY  09/20/2020   Procedure: POLYPECTOMY;  Surgeon: Wilhelmenia Aloha Raddle., MD;  Location: [facility] ENDOSCOPY;  Service: Gastroenterology;;   POLYPECTOMY  06/14/2021   Procedure: POLYPECTOMY;  Surgeon: Wilhelmenia Aloha Raddle., MD;  Location: [facility];  Service: Gastroenterology;;   POLYPECTOMY  11/26/2023   Procedure: POLYPECTOMY;  Surgeon: Wilhelmenia Aloha  Mickey., MD;  Location: [facility];  Service: Gastroenterology;;   ROBLEY LIFTING INJECTION  09/20/2020   Procedure: SUBMUCOSAL LIFTING INJECTION;  Surgeon: Wilhelmenia Aloha Mickey., MD;  Location: [facility] ENDOSCOPY;  Service: Gastroenterology;;   ROBLEY MEYER INJECTION  11/26/2023   Procedure: SUBMUCOSAL LIFTING INJECTION;  Surgeon: Wilhelmenia Aloha Mickey., MD;  Location: [facility];  Service: Gastroenterology;;   SUBMUCOSAL TATTOO INJECTION  11/26/2023   Procedure: SUBMUCOSAL TATTOO INJECTION;  Surgeon: Wilhelmenia Aloha Mickey., MD;  Location: [facility];  Service: Gastroenterology;;   UPPER GASTROINTESTINAL ENDOSCOPY      SOCIAL HISTORY: Social History   Socioeconomic History   Marital status: Divorced    Spouse name: Not on file   Number of children: Not on file   Years of education: Not on file   Highest education level: Associate degree: academic program  Occupational History   Not on file  Tobacco Use   Smoking status: Every Day    Current packs/day: 0.50    Average packs/day: 0.5 packs/day for 40.0 years (20.0 ttl pk-yrs)    Types: Cigarettes   Smokeless tobacco: Never  Vaping Use   Vaping status: Never Used  Substance and Sexual Activity   Alcohol use: Yes    Alcohol/week: 0.0 standard drinks of alcohol    Comment: 4 mixed drinks per week    Drug use: No   Sexual activity: Never  Other Topics Concern   Not on file  Social History Narrative   Not on file   Social Drivers of Health   Financial Resource Strain: Low Risk  (01/17/2024)   Overall Financial Resource Strain (CARDIA)    Difficulty of Paying Living Expenses: Not hard at all  Food Insecurity: Low Risk  (06/10/2024)   Received from [facility]   Hunger Vital Sign    Within the past 12 months, you worried that your food would run out before you got money to buy more: Never true    Within the past 12 months, the food you bought just didn't last and you didn't have money to get more. : Never true  Transportation Needs: No Transportation Needs (06/10/2024)   Received from [facility]    In the past 12 months, has lack of reliable transportation kept you from medical appointments, meetings, work or from getting things needed for daily living? : No  Physical Activity: Insufficiently Active (01/17/2024)   Exercise Vital Sign    Days  of Exercise per Week: 3 days    Minutes of Exercise per Session: 30 min  Stress: No Stress Concern Present (01/17/2024)   [employer] of Occupational Health - Occupational Stress Questionnaire    Feeling of Stress : Not at all  Social Connections: Moderately Integrated (01/17/2024)   Social Connection and Isolation Panel    Frequency of Communication with Friends and Family: Three times a week    Frequency of Social Gatherings with Friends and Family: Twice a week    Attends Religious Services: More than 4 times per year    Active Member of [employer] or Organizations: No    Attends Engineer, structural: More than 4 times per year    Marital Status: Divorced  Recent Concern: Social Connections - Moderately Isolated (12/18/2023)   Social Connection and Isolation Panel    Frequency of Communication with Friends and Family: Three times a week    Frequency of Social Gatherings with Friends and Family: Twice a week    Attends Religious Services: More than  4 times per year    Active Member of Clubs or Organizations: No    Attends Banker Meetings: Not on file    Marital Status: Divorced  Intimate Partner Violence: Not At Risk (01/17/2024)   Humiliation, Afraid, Rape, and Kick questionnaire    Fear of Current or Ex-Partner: No    Emotionally Abused: No    Physically Abused: No    Sexually Abused: No    FAMILY HISTORY: Family History  Problem Relation Age of Onset   Stomach cancer Father    Esophageal cancer Father    Crohn's disease Daughter    Ulcerative colitis Daughter    Diabetes Maternal Grandmother    Colon cancer Neg Hx    Pancreatic cancer Neg Hx    Liver disease Neg Hx    Inflammatory bowel disease Neg Hx    Rectal cancer Neg Hx    Breast cancer Neg Hx     Review of Systems  Constitutional:  Negative for appetite change, chills, fatigue, fever and unexpected weight change.  HENT:   Negative for hearing loss, lump/mass and trouble swallowing.    Eyes:  Negative for eye problems and icterus.  Respiratory:  Negative for chest tightness, cough and shortness of breath.   Cardiovascular:  Negative for chest pain, leg swelling and palpitations.  Gastrointestinal:  Negative for abdominal distention, abdominal pain, constipation, diarrhea, nausea and vomiting.  Endocrine: Negative for hot flashes.  Genitourinary:  Negative for difficulty urinating.   Musculoskeletal:  Negative for arthralgias.  Skin:  Negative for itching and rash.  Neurological:  Negative for dizziness, extremity weakness, headaches and numbness.  Hematological:  Negative for adenopathy. Does not bruise/bleed easily.  Psychiatric/Behavioral:  Negative for depression. The patient is not nervous/anxious.       PHYSICAL EXAMINATION   Onc Performance Status - 07/21/24 0933       ECOG Perf Status   ECOG Perf Status Restricted in physically strenuous activity but ambulatory and able to carry out work of a light or sedentary nature, e.g., light house work, office work (P)       KPS SCALE   KPS % SCORE Able to carry on normal activity, minor s/s of disease (P)           Vitals:   07/21/24 0923  BP: (!) 160/64  Pulse: 72  Resp: 19  Temp: 97.9 F (36.6 C)  SpO2: 100%    Physical Exam Constitutional:      General: She is not in acute distress.    Appearance: Normal appearance. She is not toxic-appearing.  HENT:     Head: Normocephalic and atraumatic.     Mouth/Throat:     Mouth: Mucous membranes are moist.     Pharynx: Oropharynx is clear. No oropharyngeal exudate or posterior oropharyngeal erythema.  Eyes:     General: No scleral icterus. Cardiovascular:     Rate and Rhythm: Normal rate and regular rhythm.     Pulses: Normal pulses.     Heart sounds: Normal heart sounds.  Pulmonary:     Effort: Pulmonary effort is normal.     Breath sounds: Normal breath sounds.  Abdominal:     General: Abdomen is flat. Bowel sounds are normal. There is no  distension.     Palpations: Abdomen is soft.     Tenderness: There is no abdominal tenderness.  Musculoskeletal:        General: No swelling.     Cervical back: Neck supple.  Lymphadenopathy:  Cervical: No cervical adenopathy.  Skin:    General: Skin is warm and dry.     Findings: No rash.  Neurological:     General: No focal deficit present.     Mental Status: She is alert.  Psychiatric:        Mood and Affect: Mood normal.        Behavior: Behavior normal.     LABORATORY DATA:  CBC    Component Value Date/Time   WBC 6.5 07/21/2024 0909   WBC 6.2 07/10/2024 0920   RBC 3.68 (L) 07/21/2024 0909   RBC 3.70 (L) 07/21/2024 0909   HGB 9.0 (L) 07/21/2024 0909   HGB 14.2 08/04/2014 0826   HCT 28.8 (L) 07/21/2024 0909   HCT 42.8 08/04/2014 0826   PLT 722 (H) 07/21/2024 0909   PLT 455 (H) 08/04/2014 0826   MCV 77.8 (L) 07/21/2024 0909   MCV 97.9 08/04/2014 0826   MCH 24.3 (L) 07/21/2024 0909   MCHC 31.3 07/21/2024 0909   RDW 21.4 (H) 07/21/2024 0909   RDW 13.6 08/04/2014 0826   LYMPHSABS 1.0 07/21/2024 0909   LYMPHSABS 2.1 08/04/2014 0826   MONOABS 0.5 07/21/2024 0909   MONOABS 0.7 08/04/2014 0826   EOSABS 0.5 07/21/2024 0909   EOSABS 0.4 08/04/2014 0826   BASOSABS 0.1 07/21/2024 0909   BASOSABS 0.1 08/04/2014 0826    CMP     Component Value Date/Time   NA 136 07/21/2024 0909   NA 137 08/04/2014 0826   K 4.0 07/21/2024 0909   K 4.4 08/04/2014 0826   CL 101 07/21/2024 0909   CL 105 07/03/2012 1416   CO2 27 07/21/2024 0909   CO2 23 08/04/2014 0826   GLUCOSE 100 (H) 07/21/2024 0909   GLUCOSE 88 08/04/2014 0826   GLUCOSE 120 (H) 07/03/2012 1416   BUN 17 07/21/2024 0909   BUN 10.1 08/04/2014 0826   CREATININE 0.64 07/21/2024 0909   CREATININE 0.69 07/10/2024 0920   CREATININE 0.8 08/04/2014 0826   CALCIUM  9.9 07/21/2024 0909   CALCIUM  9.4 08/04/2014 0826   PROT 7.4 07/21/2024 0909   PROT 7.1 08/04/2014 0826   ALBUMIN 4.5 07/21/2024 0909   ALBUMIN 4.1  08/04/2014 0826   AST 30 07/21/2024 0909   AST 24 08/04/2014 0826   ALT 26 07/21/2024 0909   ALT 26 08/04/2014 0826   ALKPHOS 88 07/21/2024 0909   ALKPHOS 88 08/04/2014 0826   BILITOT 0.3 07/21/2024 0909   BILITOT 0.45 08/04/2014 0826   GFRNONAA >60 07/21/2024 0909   GFRNONAA 92 10/26/2020 0839   GFRAA 106 10/26/2020 0839     ASSESSMENT and THERAPY PLAN:   Assessment and Plan Assessment & Plan Iron deficiency anemia Chronic iron deficiency anemia with fluctuating hemoglobin levels. Current hemoglobin is 9 after oral iron supplementation. Positive stool occult blood test suggests potential small intestinal bleed. - Await ferritin and iron study results to determine need for IV iron infusion. - Coordinate with Dr. Wilhelmenia for evaluation. - Consider IV iron infusion at [facility] if indicated by lab results.  Tobacco use disorder Chronic tobacco use with 20 pack-years, currently smoking half a pack per day. Not ready to quit. - Encouraged use of [facility] on-demand smoking cessation program for support and potential medication assistance.  RTC in 12/2023 for long term f/u.     All questions were answered. The patient knows to call the clinic with any problems, questions or concerns. We can certainly see the patient much sooner if  necessary.  Total encounter time:20 minutes*in face-to-face visit time, chart review, lab review, care coordination, order entry, and documentation of the encounter time.    Morna Kendall, NP 07/21/24 10:13 AM Medical Oncology and Hematology [facility] [address] [place], [place] [address] Tel. [phone]    Fax. [phone]  *Total Encounter Time as defined by the Centers for Medicare and Medicaid Services includes, in addition to the face-to-face time of a patient visit (documented in the note above) non-face-to-face time: obtaining and reviewing outside history, ordering and reviewing medications, tests or  procedures, care coordination (communications with other health care professionals or caregivers) and documentation in the medical record.

## 2024-07-22 ENCOUNTER — Ambulatory Visit: Payer: Self-pay

## 2024-07-28 ENCOUNTER — Ambulatory Visit: Admitting: Gastroenterology

## 2024-07-28 ENCOUNTER — Telehealth: Payer: Self-pay | Admitting: Gastroenterology

## 2024-07-28 NOTE — Telephone Encounter (Signed)
 Inbound call from patient stating she is scheduled for an office visit on 08/11/24 and procedure on 08/25/24, patient would like to know if she needs that office visit or if she should cancel. Requesting a call  Please advise  Thank you

## 2024-07-28 NOTE — Telephone Encounter (Signed)
 The pt has been advised to keep all appts as planned The pt has been advised of the information and verbalized understanding.

## 2024-07-28 NOTE — Telephone Encounter (Signed)
 Left message on machine to call back

## 2024-08-08 NOTE — Progress Notes (Signed)
 Chief Complaint: Low hemoglobin  HPI:    [name] is a 68 year old female with a past medical history as listed below including complications of anesthesia during last endoscopy procedures, iron deficiency anemia and multiple others, known to Dr. Wilhelmenia, who was referred to me by [name], MD for a complaint of low hemoglobin .      11/26/2023 colonoscopy done for surveillance of adenomatous polyps and a large adenoma with hemorrhoids, one colonic angiodysplastic lesion treated with APC, post mucosectomy to me scar in the ascending colon, another scar with small 9 mm recurrence, four 3-8 mm polyps of rectosigmoid, sigmoid, hepatic flexure.  Diverticulosis in the rectosigmoid colon and sigmoid colon.  Nonbleeding external and internal hemorrhoids.  Patient told to repeat in 3 years.    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  2 cm hiatal hernia.  Normal mucosa to the fourth portion of the duodenum, single angiodysplastic lesion in the jejunum treated with APC.  At that time discussed if IDA persisted then we need to consider repeat VCE to see if distal AVM is still present and if so then SB-SB first DBE-SB.    07/21/2024 CMP with a glucose of 100 and otherwise normal, CBC with a hemoglobin of 9.0 (7.3 on 07/10/2024, 13.6 on 12/12/2023), MCV low.  Iron studies all elevated.  Ferritin normal.  B12 elevated.  Positive Hemoccult.    08/25/2024 patient scheduled for an enteroscopy.    Today, the patient tells me that she is here because she was told she would keep this office visit which she is already scheduled for repeat enteroscopy with Dr. Wilhelmenia as above.  She also has a lab draw next week with the cancer center.  Tells me though that she would like to go ahead and get her hemoglobin and iron studies rechecked.  She stays on an oral iron tabs.  Has not noticed any blood in her stool.  No new symptoms.    Denies fever,  chills or weight loss.  Past Medical History:  Diagnosis Date   ADHD (attention deficit hyperactivity disorder) 09/07/2011   Anesthesia complication 09/20/2020   Will need GETA for future endoscopy procedures. Did not tolerate MAC well.   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   Diverticulosis    DVT (deep venous thrombosis) (HCC) 09/07/2011   Hypertension    on no medications   Hypothyroid 09/07/2011   Internal hemorrhoids    Iron deficiency anemia    Personal history of radiation therapy 2012   Pneumonia    Smoker 02/17/2013   Tubular adenoma of colon     Past Surgical History:  Procedure Laterality Date   ABDOMINAL HYSTERECTOMY     BIOPSY  12/04/2019   Procedure: BIOPSY;  Surgeon: Kristie Lamprey, MD;  Location: [facility];  Service: Endoscopy;;   BIOPSY  06/14/2021   Procedure: BIOPSY;  Surgeon: Wilhelmenia Aloha Raddle., MD;  Location: [facility];  Service: Gastroenterology;;   BREAST BIOPSY Left 2013   BREAST LUMPECTOMY  12/2011   Left breast   COLONOSCOPY     COLONOSCOPY WITH PROPOFOL  N/A 12/04/2019   Procedure: COLONOSCOPY WITH PROPOFOL ;  Surgeon: Kristie Lamprey, MD;  Location: [facility];  Service: Endoscopy;  Laterality: N/A;   COLONOSCOPY WITH  PROPOFOL  N/A 09/20/2020   Procedure: COLONOSCOPY WITH PROPOFOL ;  Surgeon: Wilhelmenia Aloha Raddle., MD;  Location: [facility] ENDOSCOPY;  Service: Gastroenterology;  Laterality: N/A;   COLONOSCOPY WITH PROPOFOL  N/A 06/14/2021   Procedure: COLONOSCOPY WITH PROPOFOL ;  Surgeon: Mansouraty, Aloha Raddle., MD;  Location: [facility];  Service: Gastroenterology;  Laterality: N/A;   COLONOSCOPY WITH PROPOFOL  N/A 11/26/2023   Procedure: COLONOSCOPY WITH PROPOFOL ;  Surgeon: Mansouraty, Aloha Raddle., MD;  Location: [facility];  Service: Gastroenterology;  Laterality: N/A;   ENDOSCOPIC  MUCOSAL RESECTION N/A 09/20/2020   Procedure: ENDOSCOPIC MUCOSAL RESECTION;  Surgeon: Wilhelmenia Aloha Raddle., MD;  Location: [facility] ENDOSCOPY;  Service: Gastroenterology;  Laterality: N/A;   ENDOSCOPIC MUCOSAL RESECTION N/A 11/26/2023   Procedure: ENDOSCOPIC MUCOSAL RESECTION;  Surgeon: Wilhelmenia Aloha Raddle., MD;  Location: [facility];  Service: Gastroenterology;  Laterality: N/A;   ENTEROSCOPY N/A 11/26/2023   Procedure: ENTEROSCOPY;  Surgeon: Wilhelmenia Aloha Raddle., MD;  Location: [facility];  Service: Gastroenterology;  Laterality: N/A;   ESOPHAGOGASTRODUODENOSCOPY (EGD) WITH PROPOFOL  N/A 12/04/2019   Procedure: ESOPHAGOGASTRODUODENOSCOPY (EGD) WITH PROPOFOL ;  Surgeon: Kristie Lamprey, MD;  Location: [facility];  Service: Endoscopy;  Laterality: N/A;   ESOPHAGOGASTRODUODENOSCOPY (EGD) WITH PROPOFOL  N/A 06/01/2022   Procedure: ESOPHAGOGASTRODUODENOSCOPY (EGD) WITH PROPOFOL ;  Surgeon: Wilhelmenia Aloha Raddle., MD;  Location: [facility];  Service: Gastroenterology;  Laterality: N/A;   EYE SURGERY Right 06/2013   Lasix - Laser   GIVENS CAPSULE STUDY N/A 06/01/2022   Procedure: GIVENS CAPSULE STUDY;  Surgeon: Wilhelmenia Aloha Raddle., MD;  Location: [facility];  Service: Gastroenterology;  Laterality: N/A;   HEMOSTASIS CLIP PLACEMENT  09/20/2020   Procedure: HEMOSTASIS CLIP PLACEMENT;  Surgeon: Wilhelmenia Aloha Raddle., MD;  Location: [facility] ENDOSCOPY;  Service: Gastroenterology;;   HOT HEMOSTASIS N/A 06/01/2022   Procedure: HOT HEMOSTASIS (ARGON PLASMA COAGULATION/BICAP);  Surgeon: Wilhelmenia Aloha Raddle., MD;  Location: [facility];  Service: Gastroenterology;  Laterality: N/A;   HOT HEMOSTASIS N/A 11/26/2023   Procedure: HOT HEMOSTASIS (ARGON PLASMA COAGULATION/BICAP);  Surgeon: Wilhelmenia Aloha Raddle., MD;  Location: [facility];  Service: Gastroenterology;  Laterality: N/A;   POLYPECTOMY  12/04/2019   Procedure: POLYPECTOMY;  Surgeon: Kristie Lamprey, MD;  Location: [facility];  Service:  Endoscopy;;   POLYPECTOMY  09/20/2020   Procedure: POLYPECTOMY;  Surgeon: Wilhelmenia Aloha Raddle., MD;  Location: [facility] ENDOSCOPY;  Service: Gastroenterology;;   POLYPECTOMY  06/14/2021   Procedure: POLYPECTOMY;  Surgeon: Wilhelmenia Aloha Raddle., MD;  Location: [facility];  Service: Gastroenterology;;   POLYPECTOMY  11/26/2023   Procedure: POLYPECTOMY;  Surgeon: Wilhelmenia Aloha Raddle., MD;  Location: [facility];  Service: Gastroenterology;;   SUBMUCOSAL LIFTING INJECTION  09/20/2020   Procedure: SUBMUCOSAL LIFTING INJECTION;  Surgeon: Wilhelmenia Aloha Raddle., MD;  Location: [facility] ENDOSCOPY;  Service: Gastroenterology;;   ROBLEY LIFTING INJECTION  11/26/2023   Procedure: SUBMUCOSAL LIFTING INJECTION;  Surgeon: Wilhelmenia Aloha Raddle., MD;  Location: [facility];  Service: Gastroenterology;;   SUBMUCOSAL TATTOO INJECTION  11/26/2023   Procedure: SUBMUCOSAL TATTOO INJECTION;  Surgeon: Wilhelmenia Aloha Raddle., MD;  Location: [facility];  Service: Gastroenterology;;   UPPER GASTROINTESTINAL ENDOSCOPY      Current Outpatient Medications  Medication Sig Dispense Refill   acetaminophen  (TYLENOL ) 650 MG CR tablet Take 650 mg by mouth every 8 (eight) hours as needed for pain.     amphetamine -dextroamphetamine  (ADDERALL) 10 MG tablet Take 1 tablet (10 mg total) by mouth 2 (two) times daily. 180 tablet 0   B Complex-C (B-COMPLEX WITH VITAMIN C) tablet Take 1 tablet by mouth daily.  bisacodyl (DULCOLAX) 5 MG EC tablet Take 5 mg by mouth in the morning.     cholecalciferol (VITAMIN D ) 1000 UNITS tablet Take 1,000 Units by mouth daily.     esomeprazole  (NEXIUM ) 40 MG capsule TAKE 1 CAPSULE (40 MG TOTAL) BY MOUTH DAILY. 90 capsule 3   levothyroxine  (SYNTHROID ) 75 MCG tablet TAKE 1 TABLET DAILY BEFORE BREAKFAST 90 tablet 3   Multiple Vitamin (MULTIVITAMIN WITH MINERALS) TABS tablet Take 1 tablet by mouth daily. Centrum Silver     naproxen  (NAPROSYN ) 375 MG tablet Take 1 tablet (375 mg total) by mouth  2 (two) times daily with a meal. 180 tablet 0   polycarbophil (FIBERCON) 625 MG tablet Take 625 mg by mouth daily.     rosuvastatin  (CRESTOR ) 20 MG tablet Take 1 tablet (20 mg total) by mouth daily. 90 tablet 1   Varenicline  Tartrate, Starter, (CHANTIX  STARTING MONTH PAK) 0.5 MG X 11 & 1 MG X 42 TBPK Take 0.5 mg tablet by mouth 1x daily for 3 days, then increase to 0.5 mg tablet 2x daily for 4 days, then increase to 1 mg tablet 2x daily. 1 each 0   No current facility-administered medications for this visit.    Allergies as of 08/11/2024 - Review Complete 07/21/2024  Allergen Reaction Noted   Penicillins Hives 07/03/2012    Family History  Problem Relation Age of Onset   Stomach cancer Father    Esophageal cancer Father    Crohn's disease Daughter    Ulcerative colitis Daughter    Diabetes Maternal Grandmother    Colon cancer Neg Hx    Pancreatic cancer Neg Hx    Liver disease Neg Hx    Inflammatory bowel disease Neg Hx    Rectal cancer Neg Hx    Breast cancer Neg Hx     Social History   Socioeconomic History   Marital status: Divorced    Spouse name: Not on file   Number of children: Not on file   Years of education: Not on file   Highest education level: Associate degree: academic program  Occupational History   Not on file  Tobacco Use   Smoking status: Every Day    Current packs/day: 0.50    Average packs/day: 0.5 packs/day for 40.0 years (20.0 ttl pk-yrs)    Types: Cigarettes   Smokeless tobacco: Never  Vaping Use   Vaping status: Never Used  Substance and Sexual Activity   Alcohol use: Yes    Alcohol/week: 0.0 standard drinks of alcohol    Comment: 4 mixed drinks per week    Drug use: No   Sexual activity: Never  Other Topics Concern   Not on file  Social History Narrative   Not on file   Social Drivers of Health   Financial Resource Strain: Low Risk  (01/17/2024)   Overall Financial Resource Strain (CARDIA)    Difficulty of Paying Living Expenses:  Not hard at all  Food Insecurity: Low Risk  (06/10/2024)   Received from [facility]   Hunger Vital Sign    Within the past 12 months, you worried that your food would run out before you got money to buy more: Never true    Within the past 12 months, the food you bought just didn't last and you didn't have money to get more. : Never true  Transportation Needs: No Transportation Needs (06/10/2024)   Received from [facility]    In the past 12 months, has lack  of reliable transportation kept you from medical appointments, meetings, work or from getting things needed for daily living? : No  Physical Activity: Insufficiently Active (01/17/2024)   Exercise Vital Sign    Days of Exercise per Week: 3 days    Minutes of Exercise per Session: 30 min  Stress: No Stress Concern Present (01/17/2024)   [employer] of Occupational Health - Occupational Stress Questionnaire    Feeling of Stress : Not at all  Social Connections: Moderately Integrated (01/17/2024)   Social Connection and Isolation Panel    Frequency of Communication with Friends and Family: Three times a week    Frequency of Social Gatherings with Friends and Family: Twice a week    Attends Religious Services: More than 4 times per year    Active Member of [employer] or Organizations: No    Attends Engineer, Structural: More than 4 times per year    Marital Status: Divorced  Recent Concern: Social Connections - Moderately Isolated (12/18/2023)   Social Connection and Isolation Panel    Frequency of Communication with Friends and Family: Three times a week    Frequency of Social Gatherings with Friends and Family: Twice a week    Attends Religious Services: More than 4 times per year    Active Member of [employer] or Organizations: No    Attends Engineer, Structural: Not on file    Marital Status: Divorced  Intimate Partner Violence: Not At Risk (01/17/2024)   Humiliation, Afraid, Rape, and Kick  questionnaire    Fear of Current or Ex-Partner: No    Emotionally Abused: No    Physically Abused: No    Sexually Abused: No    Review of Systems:    Constitutional: No weight loss, fever or chills Cardiovascular: No chest pain   Respiratory: No SOB Gastrointestinal: See HPI and otherwise negative   Physical Exam:  Vital signs: BP (!) 160/68   Pulse 73   Ht 5' 4 (1.626 m)   Wt 123 lb (55.8 kg)   BMI 21.11 kg/m    Constitutional:   Pleasant Caucasian female appears to be in NAD, Well developed, Well nourished, alert and cooperative Respiratory: Respirations even and unlabored. Lungs clear to auscultation bilaterally.   No wheezes, crackles, or rhonchi.  Cardiovascular: Normal S1, S2. No MRG. Regular rate and rhythm. No peripheral edema, cyanosis or pallor.  Gastrointestinal:  Soft, nondistended, nontender. No rebound or guarding. Normal bowel sounds. No appreciable masses or hepatomegaly. Psychiatric: Oriented to person, place and time. Demonstrates good judgement and reason without abnormal affect or behaviors.  RELEVANT LABS AND IMAGING: CBC    Component Value Date/Time   WBC 6.5 07/21/2024 0909   WBC 6.2 07/10/2024 0920   RBC 3.68 (L) 07/21/2024 0909   RBC 3.70 (L) 07/21/2024 0909   HGB 9.0 (L) 07/21/2024 0909   HGB 14.2 08/04/2014 0826   HCT 28.8 (L) 07/21/2024 0909   HCT 42.8 08/04/2014 0826   PLT 722 (H) 07/21/2024 0909   PLT 455 (H) 08/04/2014 0826   MCV 77.8 (L) 07/21/2024 0909   MCV 97.9 08/04/2014 0826   MCH 24.3 (L) 07/21/2024 0909   MCHC 31.3 07/21/2024 0909   RDW 21.4 (H) 07/21/2024 0909   RDW 13.6 08/04/2014 0826   LYMPHSABS 1.0 07/21/2024 0909   LYMPHSABS 2.1 08/04/2014 0826   MONOABS 0.5 07/21/2024 0909   MONOABS 0.7 08/04/2014 0826   EOSABS 0.5 07/21/2024 0909   EOSABS 0.4 08/04/2014 0826   BASOSABS 0.1  07/21/2024 0909   BASOSABS 0.1 08/04/2014 0826    CMP     Component Value Date/Time   NA 136 07/21/2024 0909   NA 137 08/04/2014 0826    K 4.0 07/21/2024 0909   K 4.4 08/04/2014 0826   CL 101 07/21/2024 0909   CL 105 07/03/2012 1416   CO2 27 07/21/2024 0909   CO2 23 08/04/2014 0826   GLUCOSE 100 (H) 07/21/2024 0909   GLUCOSE 88 08/04/2014 0826   GLUCOSE 120 (H) 07/03/2012 1416   BUN 17 07/21/2024 0909   BUN 10.1 08/04/2014 0826   CREATININE 0.64 07/21/2024 0909   CREATININE 0.69 07/10/2024 0920   CREATININE 0.8 08/04/2014 0826   CALCIUM  9.9 07/21/2024 0909   CALCIUM  9.4 08/04/2014 0826   PROT 7.4 07/21/2024 0909   PROT 7.1 08/04/2014 0826   ALBUMIN 4.5 07/21/2024 0909   ALBUMIN 4.1 08/04/2014 0826   AST 30 07/21/2024 0909   AST 24 08/04/2014 0826   ALT 26 07/21/2024 0909   ALT 26 08/04/2014 0826   ALKPHOS 88 07/21/2024 0909   ALKPHOS 88 08/04/2014 0826   BILITOT 0.3 07/21/2024 0909   BILITOT 0.45 08/04/2014 0826   GFRNONAA >60 07/21/2024 0909   GFRNONAA 92 10/26/2020 0839   GFRAA 106 10/26/2020 0839    Assessment: 1.  Iron deficiency anemia: Due to known small bowel AVMs, repeat enteroscopy already scheduled for 11/24 at the hospital with Dr. Wilhelmenia, patient would like recheck of labs today, continues to follow with hematology in regards to iron and iron infusions  Plan: 1.  Repeat labs today including hemoglobin, iron studies and ferritin 2.  Continue iron as per hematology 3.  Patient already scheduled for enteroscopy.  Directions were gone through with her today at office visit.  Discussed risks, benefits, limitations and alternatives and she agrees to proceed. 4.  Patient to follow in clinic per recommendations after time of procedure.  Delon Failing, PA-C [facility] 08/08/2024, 3:05 PM  Cc: [name], MD

## 2024-08-11 ENCOUNTER — Ambulatory Visit: Admitting: Physician Assistant

## 2024-08-11 ENCOUNTER — Other Ambulatory Visit

## 2024-08-11 ENCOUNTER — Encounter: Payer: Self-pay | Admitting: Physician Assistant

## 2024-08-11 VITALS — BP 160/68 | HR 73 | Ht 64.0 in | Wt 123.0 lb

## 2024-08-11 DIAGNOSIS — K5520 Angiodysplasia of colon without hemorrhage: Secondary | ICD-10-CM | POA: Diagnosis not present

## 2024-08-11 DIAGNOSIS — D509 Iron deficiency anemia, unspecified: Secondary | ICD-10-CM | POA: Diagnosis not present

## 2024-08-11 LAB — IBC + FERRITIN
Ferritin: 17.6 ng/mL (ref 10.0–291.0)
Iron: 79 ug/dL (ref 42–145)
Saturation Ratios: 16.6 % — ABNORMAL LOW (ref 20.0–50.0)
TIBC: 474.6 ug/dL — ABNORMAL HIGH (ref 250.0–450.0)
Transferrin: 339 mg/dL (ref 212.0–360.0)

## 2024-08-11 LAB — HEMOGLOBIN: Hemoglobin: 11.4 g/dL — ABNORMAL LOW (ref 12.0–15.0)

## 2024-08-11 NOTE — Progress Notes (Signed)
 Attending Physician's Attestation   I have reviewed the chart.   I agree with the Advanced Practitioner's note, impression, and recommendations with any updates as below.    Corliss Parish, MD [facility] Advanced Endoscopy Office # [phone]

## 2024-08-11 NOTE — Patient Instructions (Signed)
 Your provider has requested that you go to the basement level for lab work before leaving today. Press B on the elevator. The lab is located at the first door on the left as you exit the elevator.  _______________________________________________________  If your blood pressure at your visit was 140/90 or greater, please contact your primary care physician to follow up on this.  _______________________________________________________  If you are age 69 or older, your body mass index should be between 23-30. Your Body mass index is 21.11 kg/m. If this is out of the aforementioned range listed, please consider follow up with your Primary Care Provider.  If you are age 37 or younger, your body mass index should be between 19-25. Your Body mass index is 21.11 kg/m. If this is out of the aformentioned range listed, please consider follow up with your Primary Care Provider.   ________________________________________________________  The  GI providers would like to encourage you to use MYCHART to communicate with providers for non-urgent requests or questions.  Due to long hold times on the telephone, sending your provider a message by [facility] may be a faster and more efficient way to get a response.  Please allow 48 business hours for a response.  Please remember that this is for non-urgent requests.  _______________________________________________________  [facility] is using a team-based approach to care.  Your team is made up of your doctor and two to three APPS. Our APPS (Nurse Practitioners and Physician Assistants) work with your physician to ensure care continuity for you. They are fully qualified to address your health concerns and develop a treatment plan. They communicate directly with your gastroenterologist to care for you. Seeing the Advanced Practice Practitioners on your physician's team can help you by facilitating care more promptly, often allowing for earlier  appointments, access to diagnostic testing, procedures, and other specialty referrals.

## 2024-08-11 NOTE — H&P (View-Only) (Signed)
 Attending Physician's Attestation   I have reviewed the chart.   I agree with the Advanced Practitioner's note, impression, and recommendations with any updates as below.    Corliss Parish, MD [facility] Advanced Endoscopy Office # [phone]

## 2024-08-12 ENCOUNTER — Ambulatory Visit: Payer: Self-pay | Admitting: Physician Assistant

## 2024-08-14 ENCOUNTER — Encounter: Payer: Self-pay | Admitting: Adult Health

## 2024-08-18 ENCOUNTER — Telehealth: Payer: Self-pay | Admitting: Gastroenterology

## 2024-08-18 NOTE — Telephone Encounter (Addendum)
 Procedure:Enteroscopy Procedure date: 08/25/24 Procedure location: [facility] Arrival Time: 12:30 am Spoke with the patient Y/N: Yes Any prep concerns? No   Has the patient obtained the prep from the pharmacy ? No prep needed Do you have a care partner and transportation: Yes Any additional concerns? No

## 2024-08-19 ENCOUNTER — Encounter (HOSPITAL_COMMUNITY): Payer: Self-pay | Admitting: Gastroenterology

## 2024-08-19 ENCOUNTER — Inpatient Hospital Stay

## 2024-08-25 ENCOUNTER — Telehealth: Payer: Self-pay

## 2024-08-25 ENCOUNTER — Ambulatory Visit (HOSPITAL_COMMUNITY)
Admission: RE | Admit: 2024-08-25 | Discharge: 2024-08-25 | Disposition: A | Discharge status: Home / Self Care | Attending: Gastroenterology | Admitting: Gastroenterology

## 2024-08-25 ENCOUNTER — Ambulatory Visit (HOSPITAL_COMMUNITY): Admitting: Certified Registered Nurse Anesthetist

## 2024-08-25 ENCOUNTER — Encounter (HOSPITAL_COMMUNITY): Payer: Self-pay | Admitting: Gastroenterology

## 2024-08-25 ENCOUNTER — Encounter (HOSPITAL_COMMUNITY)
Admission: RE | Disposition: A | Discharge status: Home / Self Care | Payer: Self-pay | Source: Home / Self Care | Attending: Gastroenterology

## 2024-08-25 ENCOUNTER — Other Ambulatory Visit: Payer: Self-pay

## 2024-08-25 DIAGNOSIS — F17210 Nicotine dependence, cigarettes, uncomplicated: Secondary | ICD-10-CM | POA: Diagnosis not present

## 2024-08-25 DIAGNOSIS — K5520 Angiodysplasia of colon without hemorrhage: Secondary | ICD-10-CM | POA: Diagnosis not present

## 2024-08-25 DIAGNOSIS — I2510 Atherosclerotic heart disease of native coronary artery without angina pectoris: Secondary | ICD-10-CM

## 2024-08-25 DIAGNOSIS — L818 Other specified disorders of pigmentation: Secondary | ICD-10-CM

## 2024-08-25 DIAGNOSIS — I10 Essential (primary) hypertension: Secondary | ICD-10-CM | POA: Diagnosis not present

## 2024-08-25 DIAGNOSIS — K3189 Other diseases of stomach and duodenum: Secondary | ICD-10-CM

## 2024-08-25 DIAGNOSIS — K449 Diaphragmatic hernia without obstruction or gangrene: Secondary | ICD-10-CM

## 2024-08-25 DIAGNOSIS — K31811 Angiodysplasia of stomach and duodenum with bleeding: Secondary | ICD-10-CM

## 2024-08-25 DIAGNOSIS — K2289 Other specified disease of esophagus: Secondary | ICD-10-CM

## 2024-08-25 DIAGNOSIS — D509 Iron deficiency anemia, unspecified: Secondary | ICD-10-CM

## 2024-08-25 DIAGNOSIS — Z862 Personal history of diseases of the blood and blood-forming organs and certain disorders involving the immune mechanism: Secondary | ICD-10-CM

## 2024-08-25 DIAGNOSIS — D500 Iron deficiency anemia secondary to blood loss (chronic): Secondary | ICD-10-CM

## 2024-08-25 HISTORY — PX: ENTEROSCOPY: SHX5533

## 2024-08-25 SURGERY — ENTEROSCOPY
Anesthesia: Monitor Anesthesia Care

## 2024-08-25 MED ORDER — SUCRALFATE 1 G PO TABS: 1.0000 g | ORAL_TABLET | Freq: Two times a day (BID) | ORAL | 0 refills | Status: DC

## 2024-08-25 MED ORDER — PROPOFOL 10 MG/ML IV BOLUS
INTRAVENOUS | Status: DC | PRN
  Administered 2024-08-25 (×5): 20 mg via INTRAVENOUS

## 2024-08-25 MED ORDER — LIDOCAINE 2% (20 MG/ML) 5 ML SYRINGE
INTRAMUSCULAR | Status: DC | PRN
  Administered 2024-08-25: 100 mg via INTRAVENOUS

## 2024-08-25 MED ORDER — SPOT INK MARKER SYRINGE KIT
PACK | SUBMUCOSAL | Status: DC | PRN
  Administered 2024-08-25: 2 mL via SUBMUCOSAL

## 2024-08-25 MED ORDER — PROPOFOL 500 MG/50ML IV EMUL
INTRAVENOUS | Status: DC | PRN
  Administered 2024-08-25: 120 ug/kg/min via INTRAVENOUS

## 2024-08-25 MED ORDER — SODIUM CHLORIDE 0.9 % IV SOLN: INTRAVENOUS | Status: DC

## 2024-08-25 NOTE — Discharge Instructions (Signed)

## 2024-08-25 NOTE — Transfer of Care (Signed)
 Immediate Anesthesia Transfer of Care Note  Patient: [name]  Procedure(s) Performed: ENTEROSCOPY  Patient Location: PACU  Anesthesia Type:MAC  Level of Consciousness: sedated  Airway & Oxygen Therapy: Patient Spontanous Breathing and Patient connected to face mask oxygen  Post-op Assessment: Report given to RN and Post -op Vital signs reviewed and stable  Post vital signs: Reviewed and stable  Last Vitals:  Vitals Value Taken Time  BP    Temp    Pulse    Resp    SpO2      Last Pain:  Vitals:   08/25/24 0727  TempSrc: Temporal  PainSc: 0-No pain         Complications: No notable events documented.

## 2024-08-25 NOTE — Telephone Encounter (Signed)
-----   Message from [facility] sent at 08/25/2024  9:06 AM EST ----- Regarding: Followup [name], Have patient come back in 81-month for CBC/Iron/TIBC/Ferritin labs. Thanks. GM

## 2024-08-25 NOTE — Op Note (Addendum)
 [facility] Patient Name: [name] Procedure Date: 08/25/2024 MRN: [id number] Attending MD: Aloha Finner , MD, [id number] Date of Birth: 1956/09/29 CSN: [id number] Age: 68 Admit Type: Outpatient Procedure:                Small bowel enteroscopy Indications:              Iron deficiency anemia secondary to chronic blood                            loss, Iron deficiency anemia, Arteriovenous                            malformation in the small intestine Providers:                Aloha Finner, MD, Olam Riedel, RN, Haskel Chris, Technician Referring MD:              Medicines:                Monitored Anesthesia Care Complications:            No immediate complications. Estimated Blood Loss:     Estimated blood loss was minimal. Procedure:                Pre-Anesthesia Assessment:                           - Prior to the procedure, a History and Physical                            was performed, and patient medications and                            allergies were reviewed. The patient's tolerance of                            previous anesthesia was also reviewed. The risks                            and benefits of the procedure and the sedation                            options and risks were discussed with the patient.                            All questions were answered, and informed consent                            was obtained. Prior Anticoagulants: The patient has                            taken no anticoagulant or antiplatelet agents  except for NSAID medication. ASA Grade Assessment:                            III - A patient with severe systemic disease. After                            reviewing the risks and benefits, the patient was                            deemed in satisfactory condition to undergo the                            procedure.                           After  obtaining informed consent, the endoscope was                            passed under direct vision. Throughout the                            procedure, the patient's blood pressure, pulse, and                            oxygen saturations were monitored continuously. The                            [id number] ([id number]) olympus colonscope was                            introduced through the mouth and advanced to the                            proximal jejunum. The small bowel enteroscopy was                            accomplished without difficulty. The patient                            tolerated the procedure. Scope In: Scope Out: Findings:      No gross lesions were noted in the entire esophagus.      The Z-line was irregular and was found 38 cm from the incisors.      A 3 cm hiatal hernia was present.      Two small angioectasias with were found in the cardia and in the gastric       body. The AVM in the cardia had recently bled and when touched it began       to ooze. Fulguration to ablate the lesions by argon plasma was       successful. To prevent bleeding post-intervention. One hemostatic clip       was successfully placed (MR conditional) on the cardia AVM ablation       site. Clip manufacturer: [employer]. There was no bleeding at the       end of the procedure.  Patchy erythematous mucosa without active bleeding and with no stigmata       of bleeding was found in the entire examined duodenum.      Four angioectasias with no bleeding were found in the proximal jejunum.       Fulguration to ablate the lesions to prevent bleeding by argon plasma       was successful.      A tattoo was seen in the proximal jejunum (previously placed). We were       able to go another 15 cm at least beyond this today.      Normal mucosa was found in the rest of the examined jejunum. Area was       tattooed with an injection of Spot (carbon black) to demarcate today's       distal  extent of SBE. Impression:               - No gross lesions in the entire esophagus. Z-line                            irregular, 38 cm from the incisors.                           - 3 cm hiatal hernia.                           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                            Treated with argon plasma coagulation (APC). One in                            cardia also clipped post ablation.                           - Erythematous duodenopathy.                           - A tattoo was seen in the proximal jejunum                            (previously placed).                           - Four non-bleeding angioectasias in the jejunum.                            Treated with argon plasma coagulation (APC).                           - Normal mucosa was found in the rest of the                            visualized jejunum. Tattooed distal extent. Recommendation:           - The patient will be observed post-procedure,  until all discharge criteria are met.                           - Discharge patient to home.                           - Patient has a contact number available for                            emergencies. The signs and symptoms of potential                            delayed complications were discussed with the                            patient. Return to normal activities tomorrow.                            Written discharge instructions were provided to the                            patient.                           - Resume previous diet.                           - Carafate  1 g twice daily for 1 month.                           - Continue current PPI once daily.                           - Continue IV Iron infusions with Hematology.                           - If issues arise with continued IDA or ID, then                            would send to [facility] for DBE evaluation as prior VCE                            has shown  evidence of more distal AVMs as well.                            Could consider repeating VCE at that time as well,                            if wait for [facility] is lengthy.                           - The findings and recommendations were discussed  with the patient.                           - The findings and recommendations were discussed                            with the patient's family. Procedure Code(s):        --- Professional ---                           [phone], Small intestinal endoscopy, enteroscopy                            beyond second portion of duodenum, not including                            ileum; with control of bleeding (eg, injection,                            bipolar cautery, unipolar cautery, laser, heater                            probe, stapler, plasma coagulator)                           44799, Unlisted procedure, small intestine Diagnosis Code(s):        --- Professional ---                           K22.89, Other specified disease of esophagus                           K44.9, Diaphragmatic hernia without obstruction or                            gangrene                           K31.811, Angiodysplasia of stomach and duodenum                            with bleeding                           K31.89, Other diseases of stomach and duodenum                           K55.20, Angiodysplasia of colon without hemorrhage                           D50.0, Iron deficiency anemia secondary to blood                            loss (chronic)                           D50.9, Iron deficiency anemia, unspecified CPT copyright 2022 American  Medical Association. All rights reserved. The codes documented in this report are preliminary and upon coder review may  be revised to meet current compliance requirements. Aloha Finner, MD 08/25/2024 8:58:27 AM Number of Addenda: 0

## 2024-08-25 NOTE — Telephone Encounter (Signed)
 Lab order has been entered as ordered  Pt will be contacted in 1 month

## 2024-08-25 NOTE — Interval H&P Note (Signed)
 History and Physical Interval Note:  08/25/2024 7:59 AM  [name]  has presented today for surgery, with the diagnosis of Q27.30, D64.9.  The various methods of treatment have been discussed with the patient and family. After consideration of risks, benefits and other options for treatment, the patient has consented to  Procedure(s): ENTEROSCOPY (N/A) as a surgical intervention.  The patient's history has been reviewed, patient examined, no change in status, stable for surgery.  I have reviewed the patient's chart and labs.  Questions were answered to the patient's satisfaction.     [name]

## 2024-08-25 NOTE — Anesthesia Postprocedure Evaluation (Signed)
 Anesthesia Post Note  Patient: [name]  Procedure(s) Performed: ENTEROSCOPY     Patient location during evaluation: PACU Anesthesia Type: MAC Level of consciousness: awake and alert Pain management: pain level controlled Vital Signs Assessment: post-procedure vital signs reviewed and stable Respiratory status: spontaneous breathing, nonlabored ventilation, respiratory function stable and patient connected to nasal cannula oxygen Cardiovascular status: stable and blood pressure returned to baseline Postop Assessment: no apparent nausea or vomiting Anesthetic complications: no   No notable events documented.  Last Vitals:  Vitals:   08/25/24 0900 08/25/24 0906  BP: (!) 176/101 (!) 166/98  Pulse: 73 94  Resp: 18 17  Temp:    SpO2: 98% 95%    Last Pain:  Vitals:   08/25/24 0906  TempSrc:   PainSc: 0-No pain                 Thom JONELLE Peoples

## 2024-08-25 NOTE — Anesthesia Preprocedure Evaluation (Addendum)
 Anesthesia Evaluation  Patient identified by MRN, date of birth, ID band Patient awake    Reviewed: Allergy & Precautions, NPO status , Patient's Chart, lab work & pertinent test results  History of Anesthesia Complications Negative for: history of anesthetic complications  Airway Mallampati: III  TM Distance: >3 FB Neck ROM: Full    Dental  (+) Teeth Intact, Dental Advisory Given   Pulmonary Current Smoker and Patient abstained from smoking.   Pulmonary exam normal breath sounds clear to auscultation       Cardiovascular hypertension, + CAD and + DVT  Normal cardiovascular exam Rhythm:Regular Rate:Normal     Neuro/Psych neg Seizures negative neurological ROS  negative psych ROS   GI/Hepatic Neg liver ROS,GERD  Medicated and Controlled,,AVM   Endo/Other  Hypothyroidism    Renal/GU negative Renal ROS  negative genitourinary   Musculoskeletal  (+) Arthritis , Osteoarthritis,    Abdominal   Peds  Hematology negative hematology ROS (+)   Anesthesia Other Findings   Reproductive/Obstetrics Hx of breast cancer                              Anesthesia Physical Anesthesia Plan  ASA: 3  Anesthesia Plan: MAC   Post-op Pain Management:    Induction:   PONV Risk Score and Plan: 2 and Propofol  infusion and TIVA  Airway Management Planned: Natural Airway and Simple Face Mask  Additional Equipment: None  Intra-op Plan:   Post-operative Plan:   Informed Consent: I have reviewed the patients History and Physical, chart, labs and discussed the procedure including the risks, benefits and alternatives for the proposed anesthesia with the patient or authorized representative who has indicated his/her understanding and acceptance.       Plan Discussed with: CRNA  Anesthesia Plan Comments: (09/2020 procedure w/ mansouraty: Pt intermittently restless with increased movement during case  despite additional propofol  and fentanyl  administration. Pt frequently repositioned by endo staff/anesthesia to facilitate endoscopic procedure and to keep pressure points free from pressure. Dr. Corinne aware. Mansouraty able to proceed with case. Recommend general anesthetic for future endoscopic procedures after discussion with Turk/Mansouraty.  Has had MAC since then without issue (2023) w/ addition of precedex )         Anesthesia Quick Evaluation

## 2024-08-27 ENCOUNTER — Encounter (HOSPITAL_COMMUNITY): Payer: Self-pay | Admitting: Gastroenterology

## 2024-09-02 ENCOUNTER — Ambulatory Visit: Admitting: Physician Assistant

## 2024-09-15 ENCOUNTER — Encounter: Payer: Self-pay | Admitting: Family Medicine

## 2024-09-16 ENCOUNTER — Other Ambulatory Visit: Payer: Self-pay | Admitting: Family Medicine

## 2024-09-16 MED ORDER — AMPHETAMINE-DEXTROAMPHETAMINE 10 MG PO TABS: 10.0000 mg | ORAL_TABLET | Freq: Two times a day (BID) | ORAL | 0 refills | Status: AC

## 2024-09-17 ENCOUNTER — Other Ambulatory Visit: Payer: Self-pay | Admitting: Gastroenterology

## 2024-09-22 ENCOUNTER — Encounter: Payer: Self-pay | Admitting: Family Medicine

## 2024-09-22 ENCOUNTER — Other Ambulatory Visit: Payer: Self-pay

## 2024-09-22 NOTE — Telephone Encounter (Signed)
 Prescription Request  09/22/2024  LOV: 01/03/24  What is the name of the medication or equipment? naproxen  (NAPROSYN ) 375 MG tablet [[id number]]   Have you contacted your pharmacy to request a refill? Yes   Which pharmacy would you like this sent to?  [id number] [address], [place] - [address] [place] [address] Phone: [phone] Fax: [phone]    Patient notified that their request is being sent to the clinical staff for review and that they should receive a response within 2 business days.   Please advise at [facility] [phone]

## 2024-09-24 MED ORDER — NAPROXEN 375 MG PO TABS: 375.0000 mg | ORAL_TABLET | Freq: Two times a day (BID) | ORAL | 0 refills | Status: AC

## 2024-09-24 NOTE — Telephone Encounter (Signed)
 Requested Prescriptions  Pending Prescriptions Disp Refills   naproxen  (NAPROSYN ) 375 MG tablet 180 tablet 0    Sig: Take 1 tablet (375 mg total) by mouth 2 (two) times daily with a meal.     Analgesics:  NSAIDS Failed - 09/24/2024 12:30 PM      Failed - Manual Review: Labs are only required if the patient has taken medication for more than 8 weeks.      Failed - HGB in normal range and within 360 days    Hemoglobin  Date Value Ref Range Status  08/11/2024 11.4 (L) 12.0 - 15.0 g/dL Final  89/79/7974 9.0 (L) 12.0 - 15.0 g/dL Final    Comment:    Reticulocyte Hemoglobin testing may be clinically indicated, consider ordering this additional test [id number]    HGB  Date Value Ref Range Status  08/04/2014 14.2 11.6 - 15.9 g/dL Final         Failed - PLT in normal range and within 360 days    Platelets  Date Value Ref Range Status  08/04/2014 455 (H) 145 - 400 10e3/uL Final   Platelet Count  Date Value Ref Range Status  07/21/2024 722 (H) 150 - 400 K/uL Final         Failed - HCT in normal range and within 360 days    HCT  Date Value Ref Range Status  07/21/2024 28.8 (L) 36.0 - 46.0 % Final  08/04/2014 42.8 34.8 - 46.6 % Final         Passed - Cr in normal range and within 360 days    Creatinine  Date Value Ref Range Status  07/21/2024 0.64 0.44 - 1.00 mg/dL Final  88/96/7984 0.8 0.6 - 1.1 mg/dL Final   Creat  Date Value Ref Range Status  07/10/2024 0.69 0.50 - 1.05 mg/dL Final         Passed - eGFR is 30 or above and within 360 days    GFR, Est African American  Date Value Ref Range Status  10/26/2020 106 > OR = 60 mL/min/1.20m2 Final   GFR, Est Non African American  Date Value Ref Range Status  10/26/2020 92 > OR = 60 mL/min/1.45m2 Final   GFR, Estimated  Date Value Ref Range Status  07/21/2024 >60 >60 mL/min Final    Comment:    (NOTE) Calculated using the CKD-EPI Creatinine Equation (2021)    eGFR  Date Value Ref Range Status  12/12/2023 97 > OR =  60 mL/min/1.70m2 Final         Passed - Patient is not pregnant      Passed - Valid encounter within last 12 months    Recent Outpatient Visits           8 months ago Acute non-recurrent sinusitis, unspecified location   [place] [facility] Kayla Jeoffrey RAMAN, FNP   9 months ago Encounter for [employer] annual wellness exam   [place] [facility] [name], MD   1 year ago Encounter for Medicare annual wellness exam   [facility] [name], MD   2 years ago Wheezing   [place] [facility] [place], Butler DASEN, MD

## 2024-09-26 ENCOUNTER — Other Ambulatory Visit: Payer: Self-pay

## 2024-09-30 ENCOUNTER — Other Ambulatory Visit (INDEPENDENT_AMBULATORY_CARE_PROVIDER_SITE_OTHER)

## 2024-09-30 DIAGNOSIS — D509 Iron deficiency anemia, unspecified: Secondary | ICD-10-CM | POA: Diagnosis not present

## 2024-09-30 DIAGNOSIS — K5520 Angiodysplasia of colon without hemorrhage: Secondary | ICD-10-CM | POA: Diagnosis not present

## 2024-09-30 LAB — CBC WITH DIFFERENTIAL/PLATELET
Basophils Absolute: 0.1 K/uL (ref 0.0–0.1)
Basophils Relative: 1 % (ref 0.0–3.0)
Eosinophils Absolute: 0.5 K/uL (ref 0.0–0.7)
Eosinophils Relative: 7.4 % — ABNORMAL HIGH (ref 0.0–5.0)
HCT: 38.4 % (ref 36.0–46.0)
Hemoglobin: 13 g/dL (ref 12.0–15.0)
Lymphocytes Relative: 17.7 % (ref 12.0–46.0)
Lymphs Abs: 1.3 K/uL (ref 0.7–4.0)
MCHC: 33.8 g/dL (ref 30.0–36.0)
MCV: 88.2 fl (ref 78.0–100.0)
Monocytes Absolute: 0.7 K/uL (ref 0.1–1.0)
Monocytes Relative: 9.5 % (ref 3.0–12.0)
Neutro Abs: 4.8 K/uL (ref 1.4–7.7)
Neutrophils Relative %: 64.4 % (ref 43.0–77.0)
Platelets: 466 K/uL — ABNORMAL HIGH (ref 150.0–400.0)
RBC: 4.36 Mil/uL (ref 3.87–5.11)
RDW: 20.3 % — ABNORMAL HIGH (ref 11.5–15.5)
WBC: 7.4 K/uL (ref 4.0–10.5)

## 2024-09-30 LAB — IBC + FERRITIN
Ferritin: 15.5 ng/mL (ref 10.0–291.0)
Iron: 214 ug/dL — ABNORMAL HIGH (ref 42–145)
Saturation Ratios: 44.7 % (ref 20.0–50.0)
TIBC: 478.8 ug/dL — ABNORMAL HIGH (ref 250.0–450.0)
Transferrin: 342 mg/dL (ref 212.0–360.0)

## 2024-10-01 ENCOUNTER — Ambulatory Visit: Payer: Self-pay | Admitting: Gastroenterology

## 2024-10-08 ENCOUNTER — Telehealth: Payer: Self-pay | Admitting: Family Medicine

## 2024-10-08 ENCOUNTER — Other Ambulatory Visit: Payer: Self-pay

## 2024-10-08 MED ORDER — LEVOTHYROXINE SODIUM 75 MCG PO TABS: 75.0000 ug | ORAL_TABLET | Freq: Every day | ORAL | 0 refills | Status: AC

## 2024-10-08 NOTE — Telephone Encounter (Signed)
 Prescription Request  10/08/2024  LOV: 12/20/2023  What is the name of the medication or equipment?   levothyroxine  (SYNTHROID ) 75 MCG tablet [[id number]]  **90 day script requested**  Have you contacted your pharmacy to request a refill? Yes   Which pharmacy would you like this sent to?  [id number] [address], [place] - [address] [place] [address] Phone: [phone] Fax: [phone]    Patient notified that their request is being sent to the clinical staff for review and that they should receive a response within 2 business days.   Please advise pharmacist.

## 2024-12-02 ENCOUNTER — Inpatient Hospital Stay

## 2024-12-02 ENCOUNTER — Inpatient Hospital Stay: Admitting: Adult Health

## 2025-01-22 ENCOUNTER — Encounter
